# Patient Record
Sex: Male | Born: 1955
Health system: Southern US, Community
[De-identification: ages and names within clinical notes are randomized; demographics above are authoritative.]

## PROBLEM LIST (undated history)

## (undated) DIAGNOSIS — F419 Anxiety disorder, unspecified: Secondary | ICD-10-CM

## (undated) DIAGNOSIS — F32A Depression, unspecified: Secondary | ICD-10-CM

## (undated) DIAGNOSIS — E785 Hyperlipidemia, unspecified: Secondary | ICD-10-CM

## (undated) DIAGNOSIS — K219 Gastro-esophageal reflux disease without esophagitis: Secondary | ICD-10-CM

## (undated) DIAGNOSIS — K760 Fatty (change of) liver, not elsewhere classified: Secondary | ICD-10-CM

## (undated) DIAGNOSIS — E119 Type 2 diabetes mellitus without complications: Secondary | ICD-10-CM

## (undated) DIAGNOSIS — G4730 Sleep apnea, unspecified: Secondary | ICD-10-CM

## (undated) DIAGNOSIS — I10 Essential (primary) hypertension: Secondary | ICD-10-CM

## (undated) DIAGNOSIS — F329 Major depressive disorder, single episode, unspecified: Secondary | ICD-10-CM

## (undated) DIAGNOSIS — R079 Chest pain, unspecified: Secondary | ICD-10-CM

## (undated) HISTORY — DX: Hyperlipidemia, unspecified: E78.5

## (undated) HISTORY — DX: Fatty (change of) liver, not elsewhere classified: K76.0

## (undated) HISTORY — DX: Type 2 diabetes mellitus without complications: E11.9

## (undated) HISTORY — DX: Depression, unspecified: F32.A

## (undated) HISTORY — DX: Gastro-esophageal reflux disease without esophagitis: K21.9

## (undated) HISTORY — DX: Anxiety disorder, unspecified: F41.9

## (undated) HISTORY — DX: Chest pain, unspecified: R07.9

## (undated) HISTORY — DX: Sleep apnea, unspecified: G47.30

## (undated) HISTORY — DX: Essential (primary) hypertension: I10

## (undated) HISTORY — PX: COLONOSCOPY: SHX174

## (undated) HISTORY — DX: Major depressive disorder, single episode, unspecified: F32.9

---

## 2000-09-10 ENCOUNTER — Ambulatory Visit (HOSPITAL_BASED_OUTPATIENT_CLINIC_OR_DEPARTMENT_OTHER): Admission: RE | Admit: 2000-09-10 | Discharge: 2000-09-10 | Payer: Self-pay | Admitting: Family Medicine

## 2001-10-19 ENCOUNTER — Encounter: Payer: Self-pay | Admitting: Family Medicine

## 2001-10-19 ENCOUNTER — Encounter: Admission: RE | Admit: 2001-10-19 | Discharge: 2001-10-19 | Payer: Self-pay | Admitting: Family Medicine

## 2001-10-25 ENCOUNTER — Encounter: Admission: RE | Admit: 2001-10-25 | Discharge: 2001-10-25 | Payer: Self-pay | Admitting: Family Medicine

## 2001-10-25 ENCOUNTER — Encounter: Payer: Self-pay | Admitting: Family Medicine

## 2002-01-04 ENCOUNTER — Emergency Department (HOSPITAL_COMMUNITY): Admission: EM | Admit: 2002-01-04 | Discharge: 2002-01-04 | Payer: Self-pay | Admitting: Emergency Medicine

## 2002-01-04 ENCOUNTER — Encounter: Payer: Self-pay | Admitting: Emergency Medicine

## 2006-07-03 ENCOUNTER — Ambulatory Visit: Payer: Self-pay | Admitting: Internal Medicine

## 2006-07-20 ENCOUNTER — Ambulatory Visit: Payer: Self-pay | Admitting: Internal Medicine

## 2015-05-11 DIAGNOSIS — Z1389 Encounter for screening for other disorder: Secondary | ICD-10-CM | POA: Diagnosis not present

## 2015-05-11 DIAGNOSIS — E119 Type 2 diabetes mellitus without complications: Secondary | ICD-10-CM | POA: Diagnosis not present

## 2015-05-11 DIAGNOSIS — E782 Mixed hyperlipidemia: Secondary | ICD-10-CM | POA: Diagnosis not present

## 2015-05-11 DIAGNOSIS — Z7984 Long term (current) use of oral hypoglycemic drugs: Secondary | ICD-10-CM | POA: Diagnosis not present

## 2015-05-11 DIAGNOSIS — K219 Gastro-esophageal reflux disease without esophagitis: Secondary | ICD-10-CM | POA: Diagnosis not present

## 2015-05-11 DIAGNOSIS — F324 Major depressive disorder, single episode, in partial remission: Secondary | ICD-10-CM | POA: Diagnosis not present

## 2015-05-11 DIAGNOSIS — R748 Abnormal levels of other serum enzymes: Secondary | ICD-10-CM | POA: Diagnosis not present

## 2015-05-11 DIAGNOSIS — I10 Essential (primary) hypertension: Secondary | ICD-10-CM | POA: Diagnosis not present

## 2015-05-11 DIAGNOSIS — Z0000 Encounter for general adult medical examination without abnormal findings: Secondary | ICD-10-CM | POA: Diagnosis not present

## 2015-05-11 MED FILL — ATORVASTATIN 40 MG TABLET: 40 | 90 days supply | Qty: 90 | Fill #0

## 2015-05-11 MED FILL — EZETIMIBE 10 MG TABLET: 10 | 30 days supply | Qty: 30 | Fill #0

## 2015-05-11 MED FILL — VICTOZA 18 MG/3 ML INJECT P: 18 | 90 days supply | Qty: 27 | Fill #0

## 2015-05-13 DIAGNOSIS — S82422D Displaced transverse fracture of shaft of left fibula, subsequent encounter for closed fracture with routine healing: Secondary | ICD-10-CM | POA: Diagnosis not present

## 2015-05-26 DIAGNOSIS — H524 Presbyopia: Secondary | ICD-10-CM | POA: Diagnosis not present

## 2015-06-24 DIAGNOSIS — S82422D Displaced transverse fracture of shaft of left fibula, subsequent encounter for closed fracture with routine healing: Secondary | ICD-10-CM | POA: Diagnosis not present

## 2015-07-06 MED FILL — VALSARTAN 160 MG TABLET: 160 | 90 days supply | Qty: 90 | Fill #0

## 2015-07-06 MED FILL — EZETIMIBE 10 MG TABLET: 10 | 90 days supply | Qty: 90 | Fill #1

## 2015-07-06 MED FILL — DULoxetine HCL 60 MG CPEP: 60 | 90 days supply | Qty: 90 | Fill #0

## 2015-07-06 MED FILL — metFORMIN HCL 1000 MG TABS: 1000 | 90 days supply | Qty: 180 | Fill #0

## 2015-08-05 DIAGNOSIS — S82422D Displaced transverse fracture of shaft of left fibula, subsequent encounter for closed fracture with routine healing: Secondary | ICD-10-CM | POA: Diagnosis not present

## 2015-08-17 MED FILL — VICTOZA 18 MG/3 ML INJECT P: 18 | 90 days supply | Qty: 27 | Fill #1

## 2015-10-05 MED FILL — ATORVASTATIN 40 MG TABLET: 40 | 90 days supply | Qty: 90 | Fill #1

## 2015-10-05 MED FILL — VALSARTAN 160 MG TABLET: 160 | 90 days supply | Qty: 90 | Fill #1

## 2015-10-05 MED FILL — DULoxetine HCL 60 MG CPEP: 60 | 90 days supply | Qty: 90 | Fill #1

## 2015-10-05 MED FILL — EZETIMIBE 10 MG TABLET: 10 | 60 days supply | Qty: 60 | Fill #2

## 2015-10-05 MED FILL — metFORMIN HCL 1000 MG TABS: 1000 | 90 days supply | Qty: 180 | Fill #1

## 2015-11-16 DIAGNOSIS — F324 Major depressive disorder, single episode, in partial remission: Secondary | ICD-10-CM | POA: Diagnosis not present

## 2015-11-16 DIAGNOSIS — K219 Gastro-esophageal reflux disease without esophagitis: Secondary | ICD-10-CM | POA: Diagnosis not present

## 2015-11-16 DIAGNOSIS — E1165 Type 2 diabetes mellitus with hyperglycemia: Secondary | ICD-10-CM | POA: Diagnosis not present

## 2015-11-16 DIAGNOSIS — E782 Mixed hyperlipidemia: Secondary | ICD-10-CM | POA: Diagnosis not present

## 2015-11-16 DIAGNOSIS — I10 Essential (primary) hypertension: Secondary | ICD-10-CM | POA: Diagnosis not present

## 2016-01-01 DIAGNOSIS — M25672 Stiffness of left ankle, not elsewhere classified: Secondary | ICD-10-CM | POA: Diagnosis not present

## 2016-01-01 DIAGNOSIS — S82402S Unspecified fracture of shaft of left fibula, sequela: Secondary | ICD-10-CM | POA: Diagnosis not present

## 2016-01-01 DIAGNOSIS — M6281 Muscle weakness (generalized): Secondary | ICD-10-CM | POA: Diagnosis not present

## 2016-01-08 DIAGNOSIS — M25672 Stiffness of left ankle, not elsewhere classified: Secondary | ICD-10-CM | POA: Diagnosis not present

## 2016-01-08 DIAGNOSIS — S82402S Unspecified fracture of shaft of left fibula, sequela: Secondary | ICD-10-CM | POA: Diagnosis not present

## 2016-01-08 DIAGNOSIS — M6281 Muscle weakness (generalized): Secondary | ICD-10-CM | POA: Diagnosis not present

## 2016-01-13 DIAGNOSIS — M25672 Stiffness of left ankle, not elsewhere classified: Secondary | ICD-10-CM | POA: Diagnosis not present

## 2016-01-13 DIAGNOSIS — M6281 Muscle weakness (generalized): Secondary | ICD-10-CM | POA: Diagnosis not present

## 2016-01-13 DIAGNOSIS — S82402S Unspecified fracture of shaft of left fibula, sequela: Secondary | ICD-10-CM | POA: Diagnosis not present

## 2016-01-13 MED FILL — metFORMIN HCL 1000 MG TABS: 1000 | 90 days supply | Qty: 180 | Fill #0

## 2016-01-13 MED FILL — EZETIMIBE 10 MG TABLET: 10 | 90 days supply | Qty: 90 | Fill #0

## 2016-01-13 MED FILL — PANTOPRAZOLE SOD DR 40 MG T: 40 | 90 days supply | Qty: 90 | Fill #0

## 2016-01-13 MED FILL — VALSARTAN 160 MG TABLET: 160 | 90 days supply | Qty: 90 | Fill #0

## 2016-01-13 MED FILL — DULoxetine HCL 60 MG CPEP: 60 | 90 days supply | Qty: 180 | Fill #0

## 2016-01-13 MED FILL — ATORVASTATIN 40 MG TABLET: 40 | 90 days supply | Qty: 90 | Fill #0

## 2016-01-15 DIAGNOSIS — M7502 Adhesive capsulitis of left shoulder: Secondary | ICD-10-CM | POA: Diagnosis not present

## 2016-01-22 DIAGNOSIS — M6281 Muscle weakness (generalized): Secondary | ICD-10-CM | POA: Diagnosis not present

## 2016-01-22 DIAGNOSIS — M25672 Stiffness of left ankle, not elsewhere classified: Secondary | ICD-10-CM | POA: Diagnosis not present

## 2016-01-22 DIAGNOSIS — S82402S Unspecified fracture of shaft of left fibula, sequela: Secondary | ICD-10-CM | POA: Diagnosis not present

## 2016-02-01 DIAGNOSIS — M6281 Muscle weakness (generalized): Secondary | ICD-10-CM | POA: Diagnosis not present

## 2016-02-01 DIAGNOSIS — M25512 Pain in left shoulder: Secondary | ICD-10-CM | POA: Diagnosis not present

## 2016-02-01 DIAGNOSIS — M25612 Stiffness of left shoulder, not elsewhere classified: Secondary | ICD-10-CM | POA: Diagnosis not present

## 2016-02-04 DIAGNOSIS — M25512 Pain in left shoulder: Secondary | ICD-10-CM | POA: Diagnosis not present

## 2016-02-04 DIAGNOSIS — M25612 Stiffness of left shoulder, not elsewhere classified: Secondary | ICD-10-CM | POA: Diagnosis not present

## 2016-02-04 DIAGNOSIS — M6281 Muscle weakness (generalized): Secondary | ICD-10-CM | POA: Diagnosis not present

## 2016-02-09 DIAGNOSIS — M6281 Muscle weakness (generalized): Secondary | ICD-10-CM | POA: Diagnosis not present

## 2016-02-09 DIAGNOSIS — M25612 Stiffness of left shoulder, not elsewhere classified: Secondary | ICD-10-CM | POA: Diagnosis not present

## 2016-02-09 DIAGNOSIS — M25512 Pain in left shoulder: Secondary | ICD-10-CM | POA: Diagnosis not present

## 2016-02-11 DIAGNOSIS — M25512 Pain in left shoulder: Secondary | ICD-10-CM | POA: Diagnosis not present

## 2016-02-11 DIAGNOSIS — M6281 Muscle weakness (generalized): Secondary | ICD-10-CM | POA: Diagnosis not present

## 2016-02-11 DIAGNOSIS — M25612 Stiffness of left shoulder, not elsewhere classified: Secondary | ICD-10-CM | POA: Diagnosis not present

## 2016-02-12 DIAGNOSIS — M7502 Adhesive capsulitis of left shoulder: Secondary | ICD-10-CM | POA: Diagnosis not present

## 2016-02-16 DIAGNOSIS — M25512 Pain in left shoulder: Secondary | ICD-10-CM | POA: Diagnosis not present

## 2016-02-16 DIAGNOSIS — M6281 Muscle weakness (generalized): Secondary | ICD-10-CM | POA: Diagnosis not present

## 2016-02-16 DIAGNOSIS — M25612 Stiffness of left shoulder, not elsewhere classified: Secondary | ICD-10-CM | POA: Diagnosis not present

## 2016-02-19 DIAGNOSIS — M25612 Stiffness of left shoulder, not elsewhere classified: Secondary | ICD-10-CM | POA: Diagnosis not present

## 2016-02-19 DIAGNOSIS — M25512 Pain in left shoulder: Secondary | ICD-10-CM | POA: Diagnosis not present

## 2016-02-19 DIAGNOSIS — M6281 Muscle weakness (generalized): Secondary | ICD-10-CM | POA: Diagnosis not present

## 2016-02-23 DIAGNOSIS — M25512 Pain in left shoulder: Secondary | ICD-10-CM | POA: Diagnosis not present

## 2016-02-23 DIAGNOSIS — M25612 Stiffness of left shoulder, not elsewhere classified: Secondary | ICD-10-CM | POA: Diagnosis not present

## 2016-02-23 DIAGNOSIS — M6281 Muscle weakness (generalized): Secondary | ICD-10-CM | POA: Diagnosis not present

## 2016-02-25 DIAGNOSIS — M25612 Stiffness of left shoulder, not elsewhere classified: Secondary | ICD-10-CM | POA: Diagnosis not present

## 2016-02-25 DIAGNOSIS — M6281 Muscle weakness (generalized): Secondary | ICD-10-CM | POA: Diagnosis not present

## 2016-02-25 DIAGNOSIS — M25512 Pain in left shoulder: Secondary | ICD-10-CM | POA: Diagnosis not present

## 2016-04-12 MED FILL — VICTOZA 18 MG/3 ML INJECT P: 18 | 90 days supply | Qty: 27 | Fill #0

## 2016-04-12 MED FILL — PANTOPRAZOLE SOD DR 40 MG T: 40 | 90 days supply | Qty: 90 | Fill #1

## 2016-04-12 MED FILL — ATORVASTATIN 40 MG TABLET: 40 | 90 days supply | Qty: 90 | Fill #1

## 2016-04-12 MED FILL — DULoxetine HCL 60 MG CPEP: 60 | 90 days supply | Qty: 180 | Fill #1

## 2016-04-12 MED FILL — metFORMIN HCL 1000 MG TABS: 1000 | 90 days supply | Qty: 180 | Fill #1

## 2016-04-12 MED FILL — VALSARTAN 160 MG TABLET: 160 | 90 days supply | Qty: 90 | Fill #1

## 2016-05-16 ENCOUNTER — Encounter: Payer: Self-pay | Admitting: Gastroenterology

## 2016-07-19 MED FILL — VICTOZA 18 MG/3 ML INJECT P: 18 | 90 days supply | Qty: 27 | Fill #1

## 2016-07-19 MED FILL — DULoxetine HCL 60 MG CPEP: 60 | 90 days supply | Qty: 180 | Fill #0

## 2016-07-19 MED FILL — EZETIMIBE 10 MG TAB: 10 | 90 days supply | Qty: 90 | Fill #1

## 2016-07-19 MED FILL — ATORVASTATIN 40 MG TABLET: 40 | 90 days supply | Qty: 90 | Fill #0

## 2016-07-19 MED FILL — PANTOPRAZOLE SOD DR 40 MG T: 40 | 90 days supply | Qty: 90 | Fill #0

## 2016-07-19 MED FILL — metFORMIN HCL 1000 MG TABS: 1000 | 90 days supply | Qty: 180 | Fill #0

## 2016-07-19 MED FILL — VALSARTAN 160 MG TABLET: 160 | 90 days supply | Qty: 90 | Fill #0

## 2016-08-29 DIAGNOSIS — F411 Generalized anxiety disorder: Secondary | ICD-10-CM | POA: Diagnosis not present

## 2016-09-23 DIAGNOSIS — F411 Generalized anxiety disorder: Secondary | ICD-10-CM | POA: Diagnosis not present

## 2016-10-21 MED FILL — ATORVASTATIN 40 MG TABLET: 40 | 90 days supply | Qty: 90 | Fill #1

## 2016-10-21 MED FILL — DULoxetine HCL 60 MG CPEP: 60 | 90 days supply | Qty: 180 | Fill #1

## 2016-10-21 MED FILL — VALSARTAN 160 MG TABLET: 160 | 90 days supply | Qty: 90 | Fill #1

## 2016-10-21 MED FILL — PANTOPRAZOLE SOD DR 40 MG T: 40 | 90 days supply | Qty: 90 | Fill #1

## 2016-10-21 MED FILL — metFORMIN HCL 1000 MG TABS: 1000 | 90 days supply | Qty: 180 | Fill #1

## 2016-11-14 ENCOUNTER — Telehealth: Payer: Self-pay | Admitting: General Practice

## 2016-11-14 NOTE — Telephone Encounter (Signed)
Called patient and left a voicemail for him to call and establish care with Dr. [name] in August. Patient would like 11/30/16 however, Dr. [name] schedule does not open until the following week. Please schedule patient to establish care/req:physical (morning appt.) the week that Dr. [name] schedule is available.

## 2016-12-05 ENCOUNTER — Encounter: Payer: Self-pay | Admitting: Family Medicine

## 2016-12-05 ENCOUNTER — Ambulatory Visit (INDEPENDENT_AMBULATORY_CARE_PROVIDER_SITE_OTHER): Payer: 59 | Admitting: Family Medicine

## 2016-12-05 ENCOUNTER — Other Ambulatory Visit: Payer: Self-pay | Admitting: Family Medicine

## 2016-12-05 VITALS — BP 130/90 | HR 85 | Ht 69.0 in | Wt 229.2 lb

## 2016-12-05 DIAGNOSIS — I10 Essential (primary) hypertension: Secondary | ICD-10-CM

## 2016-12-05 DIAGNOSIS — Z1211 Encounter for screening for malignant neoplasm of colon: Secondary | ICD-10-CM

## 2016-12-05 DIAGNOSIS — E1169 Type 2 diabetes mellitus with other specified complication: Secondary | ICD-10-CM

## 2016-12-05 DIAGNOSIS — I152 Hypertension secondary to endocrine disorders: Secondary | ICD-10-CM | POA: Insufficient documentation

## 2016-12-05 DIAGNOSIS — E1165 Type 2 diabetes mellitus with hyperglycemia: Secondary | ICD-10-CM | POA: Insufficient documentation

## 2016-12-05 DIAGNOSIS — F325 Major depressive disorder, single episode, in full remission: Secondary | ICD-10-CM

## 2016-12-05 DIAGNOSIS — E1159 Type 2 diabetes mellitus with other circulatory complications: Secondary | ICD-10-CM

## 2016-12-05 DIAGNOSIS — E785 Hyperlipidemia, unspecified: Secondary | ICD-10-CM

## 2016-12-05 DIAGNOSIS — E118 Type 2 diabetes mellitus with unspecified complications: Secondary | ICD-10-CM

## 2016-12-05 DIAGNOSIS — I158 Other secondary hypertension: Secondary | ICD-10-CM | POA: Insufficient documentation

## 2016-12-05 LAB — CBC WITH DIFFERENTIAL/PLATELET
Basophils Absolute: 0 10*3/uL (ref 0.0–0.1)
Basophils Relative: 0.6 % (ref 0.0–3.0)
EOS ABS: 0.1 10*3/uL (ref 0.0–0.7)
Eosinophils Relative: 1.9 % (ref 0.0–5.0)
HEMATOCRIT: 41 % (ref 39.0–52.0)
HEMOGLOBIN: 13.8 g/dL (ref 13.0–17.0)
LYMPHS PCT: 31.8 % (ref 12.0–46.0)
Lymphs Abs: 2.2 10*3/uL (ref 0.7–4.0)
MCHC: 33.8 g/dL (ref 30.0–36.0)
MCV: 93.3 fl (ref 78.0–100.0)
MONO ABS: 0.4 10*3/uL (ref 0.1–1.0)
MONOS PCT: 5.8 % (ref 3.0–12.0)
Neutro Abs: 4.2 10*3/uL (ref 1.4–7.7)
Neutrophils Relative %: 59.9 % (ref 43.0–77.0)
Platelets: 271 10*3/uL (ref 150.0–400.0)
RBC: 4.4 Mil/uL (ref 4.22–5.81)
RDW: 13.1 % (ref 11.5–15.5)
WBC: 7 10*3/uL (ref 4.0–10.5)

## 2016-12-05 LAB — LIPID PANEL
CHOLESTEROL: 143 mg/dL (ref 0–200)
HDL: 51.6 mg/dL (ref >39.00)
LDL CALC: 66 mg/dL (ref 0–99)
NonHDL: 91.46
TRIGLYCERIDES: 125 mg/dL (ref 0.0–149.0)
Total CHOL/HDL Ratio: 3
VLDL: 25 mg/dL (ref 0.0–40.0)

## 2016-12-05 LAB — COMPREHENSIVE METABOLIC PANEL
ALBUMIN: 4.5 g/dL (ref 3.5–5.2)
ALK PHOS: 58 U/L (ref 39–117)
ALT: 48 U/L (ref 0–53)
AST: 43 U/L — ABNORMAL HIGH (ref 0–37)
BUN: 11 mg/dL (ref 6–23)
CALCIUM: 9.2 mg/dL (ref 8.4–10.5)
CO2: 24 mEq/L (ref 19–32)
Chloride: 102 mEq/L (ref 96–112)
Creatinine, Ser: 0.67 mg/dL (ref 0.40–1.50)
GFR: 128 mL/min (ref >60.00)
Glucose, Bld: 242 mg/dL — ABNORMAL HIGH (ref 70–99)
POTASSIUM: 4.2 meq/L (ref 3.5–5.1)
Sodium: 137 mEq/L (ref 135–145)
TOTAL PROTEIN: 6.5 g/dL (ref 6.0–8.3)
Total Bilirubin: 0.5 mg/dL (ref 0.2–1.2)

## 2016-12-05 LAB — POCT GLYCOSYLATED HEMOGLOBIN (HGB A1C): HEMOGLOBIN A1C: 9.4

## 2016-12-05 MED ORDER — EMPAGLIFLOZIN 10 MG PO TABS: 10.0000 mg | ORAL_TABLET | Freq: Every day | ORAL | 3 refills | Status: DC

## 2016-12-05 MED ORDER — LOSARTAN POTASSIUM 100 MG PO TABS: 100.0000 mg | ORAL_TABLET | Freq: Every day | ORAL | 3 refills | Status: DC

## 2016-12-05 MED ORDER — ESCITALOPRAM OXALATE 10 MG PO TABS: 10.0000 mg | ORAL_TABLET | Freq: Every day | ORAL | 3 refills | Status: DC

## 2016-12-05 MED FILL — EZETIMIBE 10 MG TAB: 10 | 30 days supply | Qty: 30 | Fill #0

## 2016-12-05 MED FILL — ESCITALOPRAM 10 MG TABLET: 10 | 90 days supply | Qty: 90 | Fill #0

## 2016-12-05 MED FILL — LOSARTAN POTASSIUM 100 MG T: 100 | 90 days supply | Qty: 90 | Fill #0

## 2016-12-05 MED FILL — JARDIANCE 10 MG TABLET: 10 | 90 days supply | Qty: 90 | Fill #0

## 2016-12-05 NOTE — Assessment & Plan Note (Signed)
Check lipid panel today. Continue atorvastatin and zetia.

## 2016-12-05 NOTE — Assessment & Plan Note (Signed)
PHQ-9 score of 2 today. Will check to cymbalta to lexapro per patient request. Patient will be going to [place] next month. Advised patient to not switch until after his trip in case he has unwanted side effects. Follow up in 3 months.

## 2016-12-05 NOTE — Assessment & Plan Note (Signed)
At goal today. Switch valsartan to losartan due to recall. Check CMET today. Follow up 3 months.

## 2016-12-05 NOTE — Progress Notes (Unsigned)
[name] still elevated. Will start jardiance.  [name]. [name], MD 12/05/2016 1:19 PM

## 2016-12-05 NOTE — Progress Notes (Signed)
Subjective:  Travis Owens is a 61 y.o. male who presents today with a chief complaint of HTN and to establish care.   HPI:  Hypertension, Chronic Problem, New problem to this provider. BP Readings from Last 3 Encounters:  12/05/16 130/90   Home BP monitoring-No Compliant with medications-yes, without side effects  ROS-Denies any chest pain, shortness of breath, dyspnea on exertion, leg edema.   DIABETES Type II, Chronic, New problem to this provider.  Medications: Metformin 1000mg  bid, victoza 1.8 mcg daily, Reports taking and tolerating without side effects. Blood Sugars per patient: Does not check blood sugar.  Diet-Eats a lot of fast food.  Regular Exercise-Has a total gym at home. Is trying to exercise more.  Interim History- Last [name] was 13 last year.   ROS- Denies Polyuria, Polydipsia.   Hyperlipidemia, Chronic, New problem to this provider.  On statin: Yes Side Effects: None  ROS- No chest pain or shortness of breath. No myalgias  Depression, Chronic, New to this provider Current Medications: Cymbalta 60mg  daily Side Effects: None Current Symptoms/Interim History: Currently on cymbalta, but wants to start on lexapro.   Depression screen [facility] 2/9 12/05/2016  Decreased Interest 0  Down, Depressed, Hopeless 0  PHQ - 2 Score 0    ROS: No SI or HI.  Healthcare Maintenance Health Maintenance Due  Topic Date Due  . HEMOGLOBIN [name]  26-Nov-1955  . Hepatitis C Screening  1955-12-05  . PNEUMOCOCCAL POLYSACCHARIDE VACCINE (1) 07/10/1957  . FOOT EXAM  07/10/1965  . OPHTHALMOLOGY EXAM  07/10/1965  . HIV Screening  07/11/1970  . TETANUS/TDAP  07/11/1974  . COLONOSCOPY  07/19/2016  . INFLUENZA VACCINE  11/23/2016   ROS: Per HPI, otherwise all systems reviewed and are negative  PMH:  The following were reviewed and entered/updated in epic: Past Medical History:  Diagnosis Date  . Depression   . Diabetes mellitus without complication ([place])   . GERD  (gastroesophageal reflux disease)   . Hyperlipidemia   . Hypertension    History reviewed. No pertinent surgical history.  Family History  Problem Relation Age of Onset  . Cancer Father        Lung Cancer  . Stroke Father   . Hypertension Paternal Grandmother   . Stroke Paternal Grandfather    Medications- reviewed and updated Current Outpatient Prescriptions  Medication Sig Dispense Refill  . aspirin EC 81 MG tablet Take 81 mg by mouth daily.    Marland Kitchen atorvastatin (LIPITOR) 40 MG tablet TAKE 1 TABLET BY MOUTH ONCE DAILY AS BEDTIME  1  . DULoxetine (CYMBALTA) 60 MG capsule Take one tablet daily.  1  . ezetimibe (ZETIA) 10 MG tablet Take 10 mg by mouth daily.  4  . HYDROcodone-acetaminophen (NORCO/VICODIN) 5-325 MG tablet Takes PRN for Hernia.    Marland Kitchen liraglutide (VICTOZA) 18 MG/3ML SOPN Inject into the skin. Injections 1.8 mcg every morning.    . metFORMIN (GLUCOPHAGE) 1000 MG tablet Take 1,000 mg by mouth 2 (two) times daily.  1  . Multiple Vitamin (MULTIVITAMIN) tablet Take 1 tablet by mouth daily.    . pantoprazole (PROTONIX) 40 MG tablet TAKE 1 TABLET BY MOUTH ONCE DAILY EVERY MORNING  1  . escitalopram (LEXAPRO) 10 MG tablet Take 1 tablet (10 mg total) by mouth daily. 90 tablet 3  . losartan (COZAAR) 100 MG tablet Take 1 tablet (100 mg total) by mouth daily. 90 tablet 3   No current facility-administered medications for this visit.     Allergies-reviewed  and updated No Known Allergies  Social History   Social History  . Marital status: Married    Spouse name: N/A  . Number of children: N/A  . Years of education: N/A   Occupational History  . pastor     [employer]   Social History Main Topics  . Smoking status: Never Smoker  . Smokeless tobacco: Never Used  . Alcohol use No  . Drug use: No  . Sexual activity: Yes   Other Topics Concern  . None   Social History Narrative  . None    Objective:  Physical Exam: BP 130/90   Pulse 85   Ht 5\' 9"   (1.753 m)   Wt 229 lb 3.2 oz (104 kg)   SpO2 98%   BMI 33.85 kg/m   Gen: NAD, resting comfortably CV: RRR with no murmurs appreciated Pulm: NWOB, CTAB with no crackles, wheezes, or rhonchi GI: Obese, normal bowel sounds present. Soft, Nontender, Nondistended. MSK: no edema, cyanosis, or clubbing noted Skin: warm, dry Neuro: grossly normal, moves all extremities Psych: Normal affect and thought content  Assessment/Plan:  Hypertension associated with diabetes ([place]) At goal today. Switch valsartan to losartan due to recall. Check CMET today. Follow up 3 months.   Type 2 diabetes mellitus with complication, without long-term current use of insulin ([name]) Check [name] today. Continue victoza and metformin. If still elevated, would consider starting jardiance. Follow up in 3 months.   Hyperlipidemia associated with type 2 diabetes mellitus ([place]) Check lipid panel today. Continue atorvastatin and zetia.   Depression, major, single episode, complete remission ([name]) PHQ-9 score of 2 today. Will check to cymbalta to lexapro per patient request. Patient will be going to [place] next month. Advised patient to not switch until after his trip in case he has unwanted side effects. Follow up in 3 months.   Healthcare Maintenance Colonoscopy ordered. Will send for old records for HIV and HCV status. Due for DM preventative care - will obtain at next office visit.    [name]. [name], MD 12/05/2016 1:15 PM

## 2016-12-05 NOTE — Assessment & Plan Note (Signed)
Check [name] today. Continue victoza and metformin. If still elevated, would consider starting jardiance. Follow up in 3 months.

## 2016-12-05 NOTE — Patient Instructions (Addendum)
We will change your valsartan to losartan. You can make this change today.   We will check your [name] today. If it is high we will start a new medication called jardiance.   We will check your cholesterol today.  I will send in a prescription for lexapro. You can stop your cymbalta and start your lexapro the next day.  I will put in a referral for your colonoscopy.   Come back in 3 months,  Take care,  Dr [name]

## 2016-12-07 DIAGNOSIS — F411 Generalized anxiety disorder: Secondary | ICD-10-CM | POA: Diagnosis not present

## 2016-12-22 DIAGNOSIS — F411 Generalized anxiety disorder: Secondary | ICD-10-CM | POA: Diagnosis not present

## 2017-01-10 ENCOUNTER — Encounter: Payer: Self-pay | Admitting: Gastroenterology

## 2017-01-25 MED FILL — EZETIMIBE 10 MG TAB: 10 | 30 days supply | Qty: 30 | Fill #1

## 2017-01-26 ENCOUNTER — Other Ambulatory Visit: Payer: Self-pay | Admitting: *Deleted

## 2017-01-26 MED ORDER — LIRAGLUTIDE 18 MG/3ML ~~LOC~~ SOPN: PEN_INJECTOR | SUBCUTANEOUS | 0 refills | Status: DC

## 2017-01-26 MED ORDER — PANTOPRAZOLE SODIUM 40 MG PO TBEC: DELAYED_RELEASE_TABLET | ORAL | 1 refills | Status: DC

## 2017-01-27 MED FILL — PANTOPRAZOLE SOD DR 40 MG T: 40 | 90 days supply | Qty: 90 | Fill #0

## 2017-02-13 DIAGNOSIS — F411 Generalized anxiety disorder: Secondary | ICD-10-CM | POA: Diagnosis not present

## 2017-02-27 ENCOUNTER — Ambulatory Visit (AMBULATORY_SURGERY_CENTER): Payer: Self-pay | Admitting: *Deleted

## 2017-02-27 VITALS — Ht 70.0 in | Wt 228.4 lb

## 2017-02-27 DIAGNOSIS — Z1211 Encounter for screening for malignant neoplasm of colon: Secondary | ICD-10-CM

## 2017-02-27 MED ORDER — NA SULFATE-K SULFATE-MG SULF 17.5-3.13-1.6 GM/177ML PO SOLN: 1.0000 [IU] | Freq: Once | ORAL | 0 refills | Status: AC

## 2017-02-27 MED FILL — SUPREP BOWEL PREP KIT: 17.5-3.13-1 | 1 days supply | Qty: 354 | Fill #0

## 2017-02-27 NOTE — Progress Notes (Signed)
No egg or soy allergy known to patient  No issues with past sedation with any surgeries  or procedures, no intubation problems  No diet pills per patient No home 02 use per patient  No blood thinners per patient  Pt denies issues with constipation  No A fib or A flutter  EMMI video sent to pt's e mail pt declined   

## 2017-03-01 ENCOUNTER — Encounter: Payer: Self-pay | Admitting: Gastroenterology

## 2017-03-02 DIAGNOSIS — F411 Generalized anxiety disorder: Secondary | ICD-10-CM | POA: Diagnosis not present

## 2017-03-07 ENCOUNTER — Ambulatory Visit (INDEPENDENT_AMBULATORY_CARE_PROVIDER_SITE_OTHER): Payer: 59 | Admitting: Family Medicine

## 2017-03-07 ENCOUNTER — Encounter: Payer: Self-pay | Admitting: Family Medicine

## 2017-03-07 VITALS — BP 120/78 | HR 83 | Ht 70.0 in | Wt 227.0 lb

## 2017-03-07 DIAGNOSIS — I152 Hypertension secondary to endocrine disorders: Secondary | ICD-10-CM

## 2017-03-07 DIAGNOSIS — E1169 Type 2 diabetes mellitus with other specified complication: Secondary | ICD-10-CM

## 2017-03-07 DIAGNOSIS — F325 Major depressive disorder, single episode, in full remission: Secondary | ICD-10-CM

## 2017-03-07 DIAGNOSIS — E1159 Type 2 diabetes mellitus with other circulatory complications: Secondary | ICD-10-CM

## 2017-03-07 DIAGNOSIS — Z23 Encounter for immunization: Secondary | ICD-10-CM

## 2017-03-07 DIAGNOSIS — I10 Essential (primary) hypertension: Secondary | ICD-10-CM

## 2017-03-07 DIAGNOSIS — E118 Type 2 diabetes mellitus with unspecified complications: Secondary | ICD-10-CM | POA: Diagnosis not present

## 2017-03-07 DIAGNOSIS — E785 Hyperlipidemia, unspecified: Secondary | ICD-10-CM | POA: Diagnosis not present

## 2017-03-07 LAB — POCT GLYCOSYLATED HEMOGLOBIN (HGB A1C): HEMOGLOBIN A1C: 10.9

## 2017-03-07 MED ORDER — METFORMIN HCL 1000 MG PO TABS: 1000.0000 mg | ORAL_TABLET | Freq: Two times a day (BID) | ORAL | 1 refills | Status: DC

## 2017-03-07 MED ORDER — EZETIMIBE 10 MG PO TABS: 10.0000 mg | ORAL_TABLET | Freq: Every day | ORAL | 3 refills | Status: DC

## 2017-03-07 MED ORDER — ATORVASTATIN CALCIUM 40 MG PO TABS: ORAL_TABLET | ORAL | 1 refills | Status: DC

## 2017-03-07 MED ORDER — LIRAGLUTIDE 18 MG/3ML ~~LOC~~ SOPN: PEN_INJECTOR | SUBCUTANEOUS | 0 refills | Status: DC

## 2017-03-07 MED FILL — ATORVASTATIN 40 MG TABLET: 40 | 90 days supply | Qty: 90 | Fill #0

## 2017-03-07 MED FILL — LOSARTAN POTASSIUM 100 MG T: 100 | 90 days supply | Qty: 90 | Fill #1

## 2017-03-07 MED FILL — EZETIMIBE 10 MG TABS: 10 | 30 days supply | Qty: 30 | Fill #2

## 2017-03-07 MED FILL — ESCITALOPRAM 10 MG TABLET: 10 | 90 days supply | Qty: 90 | Fill #1

## 2017-03-07 MED FILL — VICTOZA 18 MG/3 ML INJECT P: 18 | 30 days supply | Qty: 9 | Fill #0

## 2017-03-07 MED FILL — metFORMIN HCL 1000 MG TABS: 1000 | 45 days supply | Qty: 90 | Fill #0

## 2017-03-07 NOTE — Progress Notes (Signed)
Subjective:  Travis Owens is a 61 y.o. male who presents today with a chief complaint of HTN.   HPI:  Hypertension, Chronic Problem, Stable BP Readings from Last 3 Encounters:  03/07/17 120/78  12/05/16 130/90   Current Medications: losartan 100mg  daily, compliant without side effects. Interim History: Patient switched to losartan 3 months ago due to valsartan recall.  He has done well on this medication switch and has not noticed any side effects.  ROS: Denies any chest pain, shortness of breath, dyspnea on exertion, leg edema.   DIABETES Type II, Chronic, Uncontrolled. Medications: Jardiance 10 mg daily, Victoza 1.8 mcg daily, metformin 1000 mg twice daily Blood Sugars per patient: Has not been checking Interim History- Patient recently went to [place] and has only been taking metformin over that time.  He has not been checking his blood sugars over the past several weeks.Marland Kitchen  His refrigerator when out for about 5 weeks and is not been able to have any Victoza.  Now that he is back in the [place] he wishes to restart his Victoza and also wishes to start [place].  ROS: Denies Polyuria,Polydipsia, Denies  Hypoglycemia symptoms (palpitations, tremors, anxiousness)   Depression, Chronic, Stable Current Medications: Lexapro 10 mg daily Side Effects: None Current Symptoms/Interim History: Patient seen about 3 months ago and switched from Cymbalta to Lexapro.  He has done very well on this and is not noticed any side effects.  Mood is good.  Depression screen [facility] 2/9 12/05/2016  Decreased Interest 0  Down, Depressed, Hopeless 0  PHQ - 2 Score 0  Altered sleeping 0  Tired, decreased energy 1  Change in appetite 0  Feeling bad or failure about yourself  1  Trouble concentrating 0  Moving slowly or fidgety/restless 0  Suicidal thoughts 0  PHQ-9 Score 2  Difficult doing work/chores Somewhat difficult    ROS: No SI or HI.  Hyperlipidemia, Chronic, Stable Current  medication(s): Lipitor 40 mg daily, Zetia 10 mg daily Side Effects: None  ROS: No chest pain or shortness of breath. No myalgias.  ROS: Per HPI  PMH: Smoking history reviewed. Never smoker.  Objective:  Physical Exam: BP 120/78   Pulse 83   Ht 5\' 10"  (1.778 m)   Wt 227 lb (103 kg)   SpO2 98%   BMI 32.57 kg/m   Gen: NAD, resting comfortably CV: RRR with no murmurs appreciated Pulm: NWOB, CTAB with no crackles, wheezes, or rhonchi GI: Normal bowel sounds present. Soft, Nontender, Nondistended. MSK: No edema, cyanosis, or clubbing noted.  Foot exam completed without abnormalities.  Normal sensation.  See flow sheet for details. Skin: Warm, dry Neuro: Grossly normal, moves all extremities Psych: Normal affect and thought content  Assessment/Plan:  Type 2 diabetes mellitus with complication, without long-term current use of insulin ([name]) [name] increased to 10.9 today.  This is likely due to him being [place] of the [place] and not being on all of his medications.  We will plan to restart his Victoza today.  Also start Jardiance 10 mg daily. Discussed lifestyle modifications as well.  Follow-up 3 months for repeat [name].  Foot exam performed today.  Advised patient that he is due for a eye exam.  Pneumonia shot given today as well.  Hypertension associated with diabetes ([place]) At goal.  Continue losartan 100 mg daily.  Hyperlipidemia associated with type 2 diabetes mellitus ([name]) Last LDL 66.  Continue Lipitor and Zetia.  Depression, major, single episode, complete remission ([place])  Stable.  Continue Lexapro.  Preventative Healthcare Flu shot and pneumonia shot given today. Foot exam today. Advised patient to have eye exam soon. Will be getting colonoscopy next week. Awaiting records for HIV and hep C status.   [name]. [name], MD 03/07/2017 11:27 AM

## 2017-03-07 NOTE — Assessment & Plan Note (Signed)
At goal.  Continue losartan 100 mg daily. ?

## 2017-03-07 NOTE — Assessment & Plan Note (Addendum)
[name] increased to 10.9 today.  This is likely due to him being out of the country and not being on all of his medications.  We will plan to restart his Victoza today.  Also start Jardiance 10 mg daily. Discussed lifestyle modifications as well.  Follow-up 3 months for repeat [name].  Foot exam performed today.  Advised patient that he is due for a eye exam.  Pneumonia shot given today as well.

## 2017-03-07 NOTE — Assessment & Plan Note (Signed)
Stable.  Continue Lexapro. 

## 2017-03-07 NOTE — Patient Instructions (Signed)
Restart the victoza.  Take 0.57mcg daily for 1 week. Then increase to 1.34mcg daily for 1 week. Then increase to 1.8mcg daily.  Restart the jardiance.  Please schedule an eye appointment.  Come back to see me in 3 months, or sooner as needed.  Take care,  Dr [name]

## 2017-03-07 NOTE — Assessment & Plan Note (Addendum)
Last LDL 66.  Continue Lipitor and Zetia.

## 2017-03-13 ENCOUNTER — Ambulatory Visit (AMBULATORY_SURGERY_CENTER): Payer: 59 | Admitting: Gastroenterology

## 2017-03-13 ENCOUNTER — Other Ambulatory Visit: Payer: Self-pay

## 2017-03-13 ENCOUNTER — Encounter: Payer: Self-pay | Admitting: *Deleted

## 2017-03-13 VITALS — BP 110/56 | HR 80 | Temp 96.2°F | Resp 13 | Ht 70.0 in | Wt 228.0 lb

## 2017-03-13 DIAGNOSIS — D122 Benign neoplasm of ascending colon: Secondary | ICD-10-CM

## 2017-03-13 DIAGNOSIS — Z1211 Encounter for screening for malignant neoplasm of colon: Secondary | ICD-10-CM

## 2017-03-13 MED ORDER — SODIUM CHLORIDE 0.9 % IV SOLN: 500.0000 mL | INTRAVENOUS | Status: DC

## 2017-03-13 NOTE — Progress Notes (Signed)
Called to room to assist during endoscopic procedure.  Patient ID and intended procedure confirmed with present staff. Received instructions for my participation in the procedure from the performing physician.  

## 2017-03-13 NOTE — Op Note (Signed)
[place] Patient Name: Travis Owens Procedure Date: 03/13/2017 10:10 AM MRN: [id number] Endoscopist: Mallie Mussel L. Loletha Carrow , MD Age: 61 Referring MD:  Date of Birth: [birth year] Gender: Male Account #: [id number] Procedure:                Colonoscopy Indications:              Screening for colorectal malignant neoplasm ( no                            polyps on 06/2006 colonoscopy) Medicines:                Monitored Anesthesia Care Procedure:                Pre-Anesthesia Assessment:                           - Prior to the procedure, a History and Physical                            was performed, and patient medications and                            allergies were reviewed. The patient's tolerance of                            previous anesthesia was also reviewed. The risks                            and benefits of the procedure and the sedation                            options and risks were discussed with the patient.                            All questions were answered, and informed consent                            was obtained. Anticoagulants: The patient has taken                            aspirin. It was decided not to withhold this                            medication prior to the procedure. ASA Grade                            Assessment: II - A patient with mild systemic                            disease. After reviewing the risks and benefits,                            the patient was deemed in satisfactory condition to  undergo the procedure.                           After obtaining informed consent, the colonoscope                            was passed under direct vision. Throughout the                            procedure, the patient's blood pressure, pulse, and                            oxygen saturations were monitored continuously. The                            Model CF-HQ190L [id number]) scope was introduced                    through the anus and advanced to the the cecum,                            identified by appendiceal orifice and ileocecal                            valve. The colonoscopy was performed without                            difficulty. The patient tolerated the procedure                            well. The quality of the bowel preparation was                            excellent. The ileocecal valve, appendiceal                            orifice, and rectum were photographed. The bowel                            preparation used was SUPREP. Scope In: 10:16:40 AM Scope Out: 10:32:09 AM Scope Withdrawal Time: 0 hours 9 minutes 57 seconds  Total Procedure Duration: 0 hours 15 minutes 29 seconds  Findings:                 The perianal and digital rectal examinations were                            normal.                           Two sessile polyps were found in the ascending                            colon. The polyps were 2 to 4 mm in size. These  polyps were removed with a cold biopsy forceps.                            Resection and retrieval were complete.                           Diverticula were found in the left colon.                           The exam was otherwise without abnormality on                            direct and retroflexion views. Complications:            No immediate complications. Estimated Blood Loss:     Estimated blood loss was minimal. Impression:               - Two 2 to 4 mm polyps in the ascending colon,                            removed with a cold biopsy forceps. Resected and                            retrieved.                           - Diverticulosis in the left colon.                           - The examination was otherwise normal on direct                            and retroflexion views. Recommendation:           - Patient has a contact number available for                            emergencies.  The signs and symptoms of potential                            delayed complications were discussed with the                            patient. Return to normal activities tomorrow.                            Written discharge instructions were provided to the                            patient.                           - Resume previous diet.                           - Continue present medications.                           -  Await pathology results.                           - Repeat colonoscopy is recommended for                            surveillance. The colonoscopy date will be                            determined after pathology results from today's                            exam become available for review. Nihal Marzella L. Loletha Carrow, MD 03/13/2017 10:40:53 AM This report has been signed electronically.

## 2017-03-13 NOTE — Progress Notes (Signed)
To PACU, VSS. Report to [email or web address] 

## 2017-03-13 NOTE — Progress Notes (Signed)
Pt's states no medical or surgical changes since previsit or office visit. 

## 2017-03-13 NOTE — Patient Instructions (Signed)
Handout given on polyps  YOU HAD AN ENDOSCOPIC PROCEDURE TODAY AT THE [facility]:   Refer to the procedure report that was given to you for any specific questions about what was found during the examination.  If the procedure report does not answer your questions, please call your gastroenterologist to clarify.  If you requested that your care partner not be given the details of your procedure findings, then the procedure report has been included in a sealed envelope for you to review at your convenience later.  YOU SHOULD EXPECT: Some feelings of bloating in the abdomen. Passage of more gas than usual.  Walking can help get rid of the air that was put into your GI tract during the procedure and reduce the bloating. If you had a lower endoscopy (such as a colonoscopy or flexible sigmoidoscopy) you may notice spotting of blood in your stool or on the toilet paper. If you underwent a bowel prep for your procedure, you may not have a normal bowel movement for a few days.  Please Note:  You might notice some irritation and congestion in your nose or some drainage.  This is from the oxygen used during your procedure.  There is no need for concern and it should clear up in a day or so.  SYMPTOMS TO REPORT IMMEDIATELY:   Following lower endoscopy (colonoscopy or flexible sigmoidoscopy):  Excessive amounts of blood in the stool  Significant tenderness or worsening of abdominal pains  Swelling of the abdomen that is new, acute  Fever of 100F or higher    For urgent or emergent issues, a gastroenterologist can be reached at any hour by calling [phone].   DIET:  We do recommend a small meal at first, but then you may proceed to your regular diet.  Drink plenty of fluids but you should avoid alcoholic beverages for 24 hours.  ACTIVITY:  You should plan to take it easy for the rest of today and you should NOT DRIVE or use heavy machinery until tomorrow (because of the sedation  medicines used during the test).    FOLLOW UP: Our staff will call the number listed on your records the next business day following your procedure to check on you and address any questions or concerns that you may have regarding the information given to you following your procedure. If we do not reach you, we will leave a message.  However, if you are feeling well and you are not experiencing any problems, there is no need to return our call.  We will assume that you have returned to your regular daily activities without incident.  If any biopsies were taken you will be contacted by phone or by letter within the next 1-3 weeks.  Please call us at [phone] if you have not heard about the biopsies in 3 weeks.    SIGNATURES/CONFIDENTIALITY: You and/or your care partner have signed paperwork which will be entered into your electronic medical record.  These signatures attest to the fact that that the information above on your After Visit Summary has been reviewed and is understood.  Full responsibility of the confidentiality of this discharge information lies with you and/or your care-partner. 

## 2017-03-14 ENCOUNTER — Telehealth: Payer: Self-pay

## 2017-03-14 NOTE — Telephone Encounter (Signed)
  Follow up Call-  Call back number 03/13/2017  Post procedure Call Back phone  # [phone]  Permission to leave phone message Yes  Some recent data might be hidden     Patient questions:  Do you have a fever, pain , or abdominal swelling? No. Pain Score  0 *  Have you tolerated food without any problems? Yes.    Have you been able to return to your normal activities? Yes.    Do you have any questions about your discharge instructions: Diet   No. Medications  No. Follow up visit  No.  Do you have questions or concerns about your Care? No.  Actions: * If pain score is 4 or above: No action needed, pain <4.

## 2017-03-20 ENCOUNTER — Encounter: Payer: Self-pay | Admitting: Gastroenterology

## 2017-04-10 MED FILL — VICTOZA 18 MG/3 ML INJECT P: 18 | 30 days supply | Qty: 9 | Fill #1

## 2017-04-10 MED FILL — JARDIANCE 10 MG TABLET: 10 | 90 days supply | Qty: 90 | Fill #1

## 2017-04-10 MED FILL — EZETIMIBE 10 MG TABS: 10 | 30 days supply | Qty: 30 | Fill #3

## 2017-04-26 MED FILL — PANTOPRAZOLE SOD DR 40 MG T: 40 | 90 days supply | Qty: 90 | Fill #1

## 2017-04-26 MED FILL — metFORMIN HCL 1000 MG TABS: 1000 | 45 days supply | Qty: 90 | Fill #1

## 2017-05-12 DIAGNOSIS — H52203 Unspecified astigmatism, bilateral: Secondary | ICD-10-CM | POA: Diagnosis not present

## 2017-05-12 LAB — HM DIABETES EYE EXAM

## 2017-05-12 MED FILL — EZETIMIBE 10 MG TABS: 10 | 30 days supply | Qty: 30 | Fill #4

## 2017-05-30 ENCOUNTER — Encounter: Payer: Self-pay | Admitting: Family Medicine

## 2017-06-07 ENCOUNTER — Ambulatory Visit (INDEPENDENT_AMBULATORY_CARE_PROVIDER_SITE_OTHER): Payer: 59 | Admitting: Family Medicine

## 2017-06-07 ENCOUNTER — Encounter: Payer: Self-pay | Admitting: Family Medicine

## 2017-06-07 VITALS — BP 122/78 | HR 77 | Temp 98.5°F | Ht 70.0 in | Wt 228.6 lb

## 2017-06-07 DIAGNOSIS — F325 Major depressive disorder, single episode, in full remission: Secondary | ICD-10-CM

## 2017-06-07 DIAGNOSIS — E1159 Type 2 diabetes mellitus with other circulatory complications: Secondary | ICD-10-CM

## 2017-06-07 DIAGNOSIS — I152 Hypertension secondary to endocrine disorders: Secondary | ICD-10-CM

## 2017-06-07 DIAGNOSIS — I10 Essential (primary) hypertension: Secondary | ICD-10-CM | POA: Diagnosis not present

## 2017-06-07 DIAGNOSIS — E118 Type 2 diabetes mellitus with unspecified complications: Secondary | ICD-10-CM

## 2017-06-07 LAB — POCT GLYCOSYLATED HEMOGLOBIN (HGB A1C): HEMOGLOBIN A1C: 10.5

## 2017-06-07 MED ORDER — METFORMIN HCL 1000 MG PO TABS: 1000.0000 mg | ORAL_TABLET | Freq: Two times a day (BID) | ORAL | 1 refills | Status: DC

## 2017-06-07 MED ORDER — EZETIMIBE 10 MG PO TABS: 10.0000 mg | ORAL_TABLET | Freq: Every day | ORAL | 3 refills | Status: DC

## 2017-06-07 MED ORDER — PANTOPRAZOLE SODIUM 40 MG PO TBEC: DELAYED_RELEASE_TABLET | ORAL | 3 refills | Status: DC

## 2017-06-07 MED FILL — metFORMIN HCL 1000 MG TABS: 1000 | 90 days supply | Qty: 180 | Fill #0

## 2017-06-07 MED FILL — EZETIMIBE 10 MG TABS: 10 | 90 days supply | Qty: 90 | Fill #0

## 2017-06-07 MED FILL — LOSARTAN POTASSIUM 100 MG T: 100 | 90 days supply | Qty: 90 | Fill #2

## 2017-06-07 MED FILL — ATORVASTATIN 40 MG TABLET: 40 | 90 days supply | Qty: 90 | Fill #1

## 2017-06-07 MED FILL — ESCITALOPRAM 10 MG TABLET: 10 | 90 days supply | Qty: 90 | Fill #2

## 2017-06-07 NOTE — Assessment & Plan Note (Signed)
Stable.  Continue current dose of Lexapro.

## 2017-06-07 NOTE — Patient Instructions (Signed)
Please restart the Jardiance.  Please let me know if you are not able to tolerate this medication.  We will not make any other medication changes today.  I would like to see you back in about 3 months, or sooner as needed.  Take care, Dr. [name]

## 2017-06-07 NOTE — Assessment & Plan Note (Signed)
At goal.  Continue losartan. 

## 2017-06-07 NOTE — Assessment & Plan Note (Signed)
[name] still uncontrolled.  His value today was 10.5.  Discussed importance of good glycemic control regarding micro vascular complications.  We will restart Jardiance at 10 mg daily.  Advised patient to let me know if he is unable to tolerate this.  Continue current dose of Victoza and metformin.  If unable to tolerate Jardiance, would consider starting sulfonylurea versus other SGL T-2 inhibitor.  Encourage patient to continue lifestyle modifications.  Discussed goal weight and weight loss.  Follow-up in 3 months.  If continues to be uncontrolled despite 3 oral anti-glycemics as well as GLP-1 agonist, will likely need to start basal insulin.

## 2017-06-07 NOTE — Progress Notes (Signed)
    Subjective:  Travis Owens is a 62 y.o. male who presents today with a chief complaint of T2DM follow up.   HPI:  T2DM, Established problem, uncontrolled Patient seen about 3 months ago.  At that visit, we restarted his Victoza.  We also started Jardiance 10 mg daily.  Patient has been compliant with his Victoza however reports that the Jardiance made him feel "off" in his genital area.  He was only on the Jardiance for about a month before stopping it.  Denies any dysuria.  Denies any frequent urination.  He has been compliant with his metformin 1000 mg twice daily.  Denies any recent dietary changes.  He is planning on exercising more once the weather improves outside.  Hypertension, established problem, stable Tolerating losartan well without side effects.  Compliant with his medication.  No chest pain or shortness of breath.  Depression, stable from, stable On Lexapro 10 mg daily.  No feelings of depressed mood.  No anhedonia.  Tolerating this dose well without side effects.  ROS: Per HPI  PMH: He reports that  has never smoked. he has never used smokeless tobacco. He reports that he does not drink alcohol or use drugs.  Objective:  Physical Exam: BP 122/78 (BP Location: Left Arm, Patient Position: Sitting, Cuff Size: Normal)   Pulse 77   Temp 98.5 F (36.9 C) (Oral)   Ht 5\' 10"  (1.778 m)   Wt 228 lb 9.6 oz (103.7 kg)   SpO2 95%   BMI 32.80 kg/m   Gen: NAD, resting comfortably CV: RRR with no murmurs appreciated Pulm: NWOB, CTAB with no crackles, wheezes, or rhonchi GI: Normal bowel sounds present. Soft, Nontender, Nondistended.  Results for orders placed or performed in visit on 06/07/17 (from the past 24 hour(s))  POCT glycosylated hemoglobin (Hb [name])     Status: None   Collection Time: 06/07/17 10:32 AM  Result Value Ref Range   Hemoglobin [name] 10.5     Assessment/Plan:  Type 2 diabetes mellitus with complication, without long-term current use of insulin  ([name]) [name] still uncontrolled.  His value today was 10.5.  Discussed importance of good glycemic control regarding micro vascular complications.  We will restart Jardiance at 10 mg daily.  Advised patient to let me know if he is unable to tolerate this.  Continue current dose of Victoza and metformin.  If unable to tolerate Jardiance, would consider starting sulfonylurea versus other SGL T-2 inhibitor.  Encourage patient to continue lifestyle modifications.  Discussed goal weight and weight loss.  Follow-up in 3 months.  If continues to be uncontrolled despite 3 oral anti-glycemics as well as GLP-1 agonist, will likely need to start basal insulin.  Hypertension associated with diabetes ([place]) At goal.  Continue losartan.  Depression, major, single episode, complete remission ([name]) Stable.  Continue current dose of Lexapro.  [name]. [name], MD 06/07/2017 11:55 AM

## 2017-08-18 MED FILL — PANTOPRAZOLE SOD DR 40 MG T: 40 | 90 days supply | Qty: 90 | Fill #0

## 2017-08-18 MED FILL — VICTOZA 18 MG/3 ML INJECT P: 18 | 30 days supply | Qty: 9 | Fill #2

## 2017-09-04 ENCOUNTER — Ambulatory Visit (INDEPENDENT_AMBULATORY_CARE_PROVIDER_SITE_OTHER): Payer: 59 | Admitting: Family Medicine

## 2017-09-04 ENCOUNTER — Other Ambulatory Visit: Payer: Self-pay | Admitting: Family Medicine

## 2017-09-04 ENCOUNTER — Encounter: Payer: Self-pay | Admitting: Family Medicine

## 2017-09-04 VITALS — BP 124/82 | HR 67 | Temp 98.8°F | Resp 12 | Ht 70.0 in | Wt 227.0 lb

## 2017-09-04 DIAGNOSIS — E1169 Type 2 diabetes mellitus with other specified complication: Secondary | ICD-10-CM | POA: Diagnosis not present

## 2017-09-04 DIAGNOSIS — E118 Type 2 diabetes mellitus with unspecified complications: Secondary | ICD-10-CM

## 2017-09-04 DIAGNOSIS — E1159 Type 2 diabetes mellitus with other circulatory complications: Secondary | ICD-10-CM

## 2017-09-04 DIAGNOSIS — E785 Hyperlipidemia, unspecified: Secondary | ICD-10-CM

## 2017-09-04 DIAGNOSIS — I10 Essential (primary) hypertension: Secondary | ICD-10-CM | POA: Diagnosis not present

## 2017-09-04 DIAGNOSIS — I152 Hypertension secondary to endocrine disorders: Secondary | ICD-10-CM

## 2017-09-04 MED FILL — LOSARTAN POTASSIUM 100 MG T: 100 | 90 days supply | Qty: 90 | Fill #3

## 2017-09-04 MED FILL — EZETIMIBE 10 MG TABLET: 10 | 90 days supply | Qty: 90 | Fill #1

## 2017-09-04 MED FILL — ATORVASTATIN 40 MG TABLET: 40 | 90 days supply | Qty: 90 | Fill #0

## 2017-09-04 MED FILL — metFORMIN HCL 1000 MG TABS: 1000 | 90 days supply | Qty: 180 | Fill #1

## 2017-09-04 MED FILL — ESCITALOPRAM 10 MG TABLET: 10 | 90 days supply | Qty: 90 | Fill #3

## 2017-09-04 NOTE — Progress Notes (Signed)
   Subjective:  Travis Owens is a 62 y.o. male who presents today with a chief complaint of T2DM.   HPI:  T2DM, chronic problem, stable Patient seen about a 3 months ago for this.  That time [name] was 10.5.  We restarted his Jardiance at 10 mg daily.  We also continued him on the Actos and metformin.  He has done well with his medication since.  He has been compliant with Jardiance 10 mg daily, metformin 1000 mg twice daily, and Victoza 1.2 mg daily.  Patient had to dial back his Actos a dose due to some nausea and vomiting, but is otherwise tolerating his current dose well.  He has been more active lately.  Working on weight loss.  Has been more active in his yard as well.  Hypertension, established problem, stable Currently on losartan 100 mg daily.  Tolerates this well without side effects.  Hyperlipidemia, established problem, stable On Lipitor 40 mg daily.  Tolerates this well without side effects.  Needs refill today.  ROS: Per HPI  PMH: He reports that he has never smoked. He has never used smokeless tobacco. He reports that he does not drink alcohol or use drugs.   Objective:  Physical Exam: BP 124/82   Pulse 67   Temp 98.8 F (37.1 C) (Oral)   Resp 12   Ht 5\' 10"  (1.778 m)   Wt 227 lb (103 kg)   SpO2 97%   BMI 32.57 kg/m   Gen: NAD, resting comfortably CV: RRR with no murmurs appreciated Pulm: NWOB, CTAB with no crackles, wheezes, or rhonchi  Assessment/Plan:  Type 2 diabetes mellitus with complication, without long-term current use of insulin ([name]) Patient is not within 90 days to recheck his [name] today.  We will continue his current medications of Jardiance 10 mg daily, metformin 1000 mg twice daily, and Victoza 1.2 mg daily.  He will come back in 3 days to recheck [name].  If still not at goal, would consider increasing dose of Jardiance, or increasing dose of Victoza as tolerated.  Hypertension associated with diabetes ([place]) At goal on losartan 100mg  daily.    Hyperlipidemia associated with type 2 diabetes mellitus ([name]) Stable on Lipitor and Zetia.  Refill for Lipitor was sent in today.   [name]. [name], MD 09/04/2017 10:59 AM

## 2017-09-04 NOTE — Assessment & Plan Note (Signed)
Stable on Lipitor and Zetia.  Refill for Lipitor was sent in today.

## 2017-09-04 NOTE — Assessment & Plan Note (Signed)
At goal on losartan 100mg daily.  

## 2017-09-04 NOTE — Assessment & Plan Note (Signed)
Patient is not within 90 days to recheck his [name] today.  We will continue his current medications of Jardiance 10 mg daily, metformin 1000 mg twice daily, and Victoza 1.2 mg daily.  He will come back in 3 days to recheck [name].  If still not at goal, would consider increasing dose of Jardiance, or increasing dose of Victoza as tolerated.

## 2017-09-04 NOTE — Patient Instructions (Signed)
It was very nice to see you today!  I am glad that you are able to lose a little weight.  Please keep up the good work.  Please come back later this week to have your [name] checked.  You can schedule this as a nurse visit.  We will not make any other medication changes today.    I would like to see back in 3 to 6 months depending on the result of your [name].  Take care, Dr [name]

## 2017-09-07 ENCOUNTER — Ambulatory Visit (INDEPENDENT_AMBULATORY_CARE_PROVIDER_SITE_OTHER): Payer: 59

## 2017-09-07 DIAGNOSIS — E118 Type 2 diabetes mellitus with unspecified complications: Secondary | ICD-10-CM

## 2017-09-07 LAB — POCT GLYCOSYLATED HEMOGLOBIN (HGB A1C): HEMOGLOBIN A1C: 7.8

## 2017-09-07 NOTE — Progress Notes (Signed)
Patient in office for nurse visit to have [name] checked. Patient did not have any questions at this time a1c was 7.8. Informed we will call if any changes need to be made after provider has reviewed. Patient was very happy with the improvement from last check.

## 2017-11-06 MED FILL — JARDIANCE 10 MG TABLET: 10 | 90 days supply | Qty: 90 | Fill #2

## 2017-12-04 ENCOUNTER — Other Ambulatory Visit: Payer: Self-pay | Admitting: Family Medicine

## 2017-12-04 MED FILL — ATORVASTATIN 40 MG TABLET: 40 | 90 days supply | Qty: 90 | Fill #1

## 2017-12-04 MED FILL — VICTOZA 18 MG/3 ML INJECT P: 18 | 30 days supply | Qty: 9 | Fill #3

## 2017-12-04 MED FILL — EZETIMIBE 10 MG TABLET: 10 | 90 days supply | Qty: 90 | Fill #2

## 2017-12-04 MED FILL — metFORMIN HCL 1000 MG TABS: 1000 | 90 days supply | Qty: 180 | Fill #0

## 2017-12-04 MED FILL — ESCITALOPRAM 10 MG TABLET: 10 | 90 days supply | Qty: 90 | Fill #0

## 2017-12-04 MED FILL — LOSARTAN POTASSIUM 100 MG T: 100 | 90 days supply | Qty: 90 | Fill #0

## 2017-12-04 MED FILL — PANTOPRAZOLE SOD DR 40 MG T: 40 | 90 days supply | Qty: 90 | Fill #1

## 2018-01-08 DIAGNOSIS — F411 Generalized anxiety disorder: Secondary | ICD-10-CM | POA: Diagnosis not present

## 2018-02-06 ENCOUNTER — Other Ambulatory Visit: Payer: Self-pay | Admitting: Family Medicine

## 2018-02-07 MED FILL — JARDIANCE 10 MG TABLET: 10 | 90 days supply | Qty: 90 | Fill #0

## 2018-03-13 ENCOUNTER — Other Ambulatory Visit: Payer: Self-pay | Admitting: Family Medicine

## 2018-03-13 MED FILL — PANTOPRAZOLE SOD DR 40 MG T: 40 | 90 days supply | Qty: 90 | Fill #2

## 2018-03-13 MED FILL — metFORMIN HCL 1000 MG TABS: 1000 | 90 days supply | Qty: 180 | Fill #1

## 2018-03-13 MED FILL — EZETIMIBE 10 MG TABS: 10 | 90 days supply | Qty: 90 | Fill #3

## 2018-03-13 MED FILL — LOSARTAN POTASSIUM 100 MG T: 100 | 90 days supply | Qty: 90 | Fill #1

## 2018-03-13 MED FILL — ESCITALOPRAM 10 MG TABLET: 10 | 90 days supply | Qty: 90 | Fill #1

## 2018-03-13 MED FILL — ATORVASTATIN 40 MG TABLET: 40 | 90 days supply | Qty: 90 | Fill #0

## 2018-04-02 DIAGNOSIS — F411 Generalized anxiety disorder: Secondary | ICD-10-CM | POA: Diagnosis not present

## 2018-05-15 MED FILL — JARDIANCE 10 MG TABLET: 10 | 90 days supply | Qty: 90 | Fill #1

## 2018-06-11 ENCOUNTER — Other Ambulatory Visit: Payer: Self-pay | Admitting: Family Medicine

## 2018-06-11 MED FILL — PANTOPRAZOLE SOD DR 40 MG T: 40 | 90 days supply | Qty: 90 | Fill #0

## 2018-06-11 MED FILL — ESCITALOPRAM 10 MG TABLET: 10 | 90 days supply | Qty: 90 | Fill #2

## 2018-06-11 MED FILL — metFORMIN HCL 1000 MG TABS: 1000 | 90 days supply | Qty: 180 | Fill #0

## 2018-06-11 MED FILL — LOSARTAN POTASSIUM 100 MG T: 100 | 90 days supply | Qty: 90 | Fill #2

## 2018-06-11 MED FILL — EZETIMIBE 10 MG TABS: 10 | 90 days supply | Qty: 90 | Fill #0

## 2018-06-11 MED FILL — ATORVASTATIN 40 MG TABLET: 40 | 90 days supply | Qty: 90 | Fill #1

## 2018-07-05 ENCOUNTER — Other Ambulatory Visit: Payer: Self-pay | Admitting: Family Medicine

## 2018-07-11 ENCOUNTER — Other Ambulatory Visit: Payer: Self-pay | Admitting: Family Medicine

## 2018-08-22 MED FILL — JARDIANCE 10 MG TABLET: 10 | 90 days supply | Qty: 90 | Fill #0

## 2018-09-10 ENCOUNTER — Encounter: Payer: 59 | Admitting: Family Medicine

## 2018-09-12 ENCOUNTER — Other Ambulatory Visit: Payer: Self-pay

## 2018-09-12 ENCOUNTER — Telehealth: Payer: Self-pay | Admitting: Family Medicine

## 2018-09-12 ENCOUNTER — Ambulatory Visit (INDEPENDENT_AMBULATORY_CARE_PROVIDER_SITE_OTHER): Payer: No Typology Code available for payment source | Admitting: Family Medicine

## 2018-09-12 ENCOUNTER — Encounter: Payer: Self-pay | Admitting: Family Medicine

## 2018-09-12 VITALS — BP 136/84 | HR 85 | Temp 98.4°F | Ht 70.0 in | Wt 227.0 lb

## 2018-09-12 DIAGNOSIS — E1169 Type 2 diabetes mellitus with other specified complication: Secondary | ICD-10-CM

## 2018-09-12 DIAGNOSIS — Z125 Encounter for screening for malignant neoplasm of prostate: Secondary | ICD-10-CM

## 2018-09-12 DIAGNOSIS — E118 Type 2 diabetes mellitus with unspecified complications: Secondary | ICD-10-CM

## 2018-09-12 DIAGNOSIS — I10 Essential (primary) hypertension: Secondary | ICD-10-CM | POA: Diagnosis not present

## 2018-09-12 DIAGNOSIS — Z0001 Encounter for general adult medical examination with abnormal findings: Secondary | ICD-10-CM

## 2018-09-12 DIAGNOSIS — E1159 Type 2 diabetes mellitus with other circulatory complications: Secondary | ICD-10-CM | POA: Diagnosis not present

## 2018-09-12 DIAGNOSIS — E785 Hyperlipidemia, unspecified: Secondary | ICD-10-CM | POA: Diagnosis not present

## 2018-09-12 DIAGNOSIS — F325 Major depressive disorder, single episode, in full remission: Secondary | ICD-10-CM

## 2018-09-12 DIAGNOSIS — I152 Hypertension secondary to endocrine disorders: Secondary | ICD-10-CM

## 2018-09-12 LAB — COMPREHENSIVE METABOLIC PANEL
ALT: 29 U/L (ref 0–53)
AST: 25 U/L (ref 0–37)
Albumin: 4.7 g/dL (ref 3.5–5.2)
Alkaline Phosphatase: 57 U/L (ref 39–117)
BUN: 13 mg/dL (ref 6–23)
CO2: 29 mEq/L (ref 19–32)
Calcium: 9.6 mg/dL (ref 8.4–10.5)
Chloride: 99 mEq/L (ref 96–112)
Creatinine, Ser: 0.76 mg/dL (ref 0.40–1.50)
GFR: 103.53 mL/min (ref >60.00)
Glucose, Bld: 138 mg/dL — ABNORMAL HIGH (ref 70–99)
Potassium: 4.8 mEq/L (ref 3.5–5.1)
Sodium: 138 mEq/L (ref 135–145)
Total Bilirubin: 0.4 mg/dL (ref 0.2–1.2)
Total Protein: 7.3 g/dL (ref 6.0–8.3)

## 2018-09-12 LAB — LIPID PANEL
Cholesterol: 157 mg/dL (ref 0–200)
HDL: 63.5 mg/dL (ref >39.00)
LDL Cholesterol: 58 mg/dL (ref 0–99)
NonHDL: 93.54
Total CHOL/HDL Ratio: 2
Triglycerides: 179 mg/dL — ABNORMAL HIGH (ref 0.0–149.0)
VLDL: 35.8 mg/dL (ref 0.0–40.0)

## 2018-09-12 LAB — PSA: PSA: 1.29 ng/mL (ref 0.10–4.00)

## 2018-09-12 LAB — CBC
HCT: 41.6 % (ref 39.0–52.0)
Hemoglobin: 14 g/dL (ref 13.0–17.0)
MCHC: 33.7 g/dL (ref 30.0–36.0)
MCV: 88.8 fl (ref 78.0–100.0)
Platelets: 289 10*3/uL (ref 150.0–400.0)
RBC: 4.69 Mil/uL (ref 4.22–5.81)
RDW: 14.6 % (ref 11.5–15.5)
WBC: 9.2 10*3/uL (ref 4.0–10.5)

## 2018-09-12 LAB — TSH: TSH: 1.54 u[IU]/mL (ref 0.35–4.50)

## 2018-09-12 LAB — HEMOGLOBIN A1C: Hgb A1c MFr Bld: 9.5 % — ABNORMAL HIGH (ref 4.6–6.5)

## 2018-09-12 MED ORDER — EZETIMIBE 10 MG PO TABS: 10.0000 mg | ORAL_TABLET | Freq: Every day | ORAL | 3 refills | Status: DC

## 2018-09-12 MED ORDER — ATORVASTATIN CALCIUM 40 MG PO TABS: 40.0000 mg | ORAL_TABLET | Freq: Every day | ORAL | 3 refills | Status: DC

## 2018-09-12 MED ORDER — ESCITALOPRAM OXALATE 20 MG PO TABS: 20.0000 mg | ORAL_TABLET | Freq: Every day | ORAL | 3 refills | Status: DC

## 2018-09-12 MED ORDER — PANTOPRAZOLE SODIUM 40 MG PO TBEC: 40.0000 mg | DELAYED_RELEASE_TABLET | Freq: Every day | ORAL | 3 refills | Status: DC

## 2018-09-12 MED ORDER — METFORMIN HCL 1000 MG PO TABS: 1000.0000 mg | ORAL_TABLET | Freq: Two times a day (BID) | ORAL | 3 refills | Status: DC

## 2018-09-12 MED ORDER — EMPAGLIFLOZIN 10 MG PO TABS: 10.0000 mg | ORAL_TABLET | Freq: Every day | ORAL | 90 refills | Status: DC

## 2018-09-12 MED ORDER — LOSARTAN POTASSIUM 100 MG PO TABS: 100.0000 mg | ORAL_TABLET | Freq: Every day | ORAL | 3 refills | Status: DC

## 2018-09-12 MED ORDER — LIRAGLUTIDE 18 MG/3ML ~~LOC~~ SOPN: PEN_INJECTOR | SUBCUTANEOUS | 3 refills | Status: DC

## 2018-09-12 MED FILL — EZETIMIBE 10 MG TABS: 10 | 90 days supply | Qty: 90 | Fill #0

## 2018-09-12 MED FILL — metFORMIN HCL 1000 MG TABS: 1000 | 90 days supply | Qty: 180 | Fill #0

## 2018-09-12 MED FILL — LOSARTAN POTASSIUM 100 MG T: 100 | 30 days supply | Qty: 30 | Fill #0

## 2018-09-12 MED FILL — ESCITALOPRAM 20 MG TABLET: 20 | 90 days supply | Qty: 90 | Fill #0

## 2018-09-12 MED FILL — ATORVASTATIN 40 MG TABLET: 40 | 90 days supply | Qty: 90 | Fill #0

## 2018-09-12 MED FILL — PANTOPRAZOLE SOD DR 40 MG T: 40 | 90 days supply | Qty: 90 | Fill #0

## 2018-09-12 NOTE — Patient Instructions (Signed)
It was very nice to see you today!  We will increase your Lexapro to 20 mg daily.  No other medication changes today.  We will check blood work today.  Please continue working on diet and exercise.  I would like to see back in 3 to 6 months depending on your [name].  Take care, Dr [name]

## 2018-09-12 NOTE — Assessment & Plan Note (Signed)
At goal.  Continue losartan 100 mg daily.  Check CBC, C met, and TSH. 

## 2018-09-12 NOTE — Telephone Encounter (Signed)
See note

## 2018-09-12 NOTE — Assessment & Plan Note (Signed)
Check [name].  Continue Jardiance 10 mg daily, metformin 1000 mg twice daily, and Victoza 1.2 mg daily.

## 2018-09-12 NOTE — Assessment & Plan Note (Signed)
Check lipid panel.  Continue Lipitor 40 mg daily and Zetia 10 mg daily.

## 2018-09-12 NOTE — Assessment & Plan Note (Signed)
Increase lexapro to 20 mg daily.

## 2018-09-12 NOTE — Telephone Encounter (Signed)
Copied from [place] [id number]. Topic: Quick Communication - See Telephone Encounter >> Sep 12, 2018  3:54 PM Vernona Rieger wrote: CRM for notification. See Telephone encounter for: 09/12/18. [facility] called and said the prescription for liraglutide (VICTOZA) 18 MG/3ML SOPN is showing this - Sig: Injections 1.8 mcg every morning. They want to make sure it is still for MG and not MCG, please advise

## 2018-09-12 NOTE — Progress Notes (Signed)
Chief Complaint:  Travis Owens is a 63 y.o. male who presents today for his annual comprehensive physical exam.    Assessment/Plan:  Type 2 diabetes mellitus with complication, without long-term current use of insulin ([name]) Check [name].  Continue Jardiance 10 mg daily, metformin 1000 mg twice daily, and Victoza 1.2 mg daily.  Hypertension associated with diabetes ([place]) At goal.  Continue losartan 100 mg daily.  Check CBC, C met, and TSH.  Hyperlipidemia associated with type 2 diabetes mellitus ([name]) Check lipid panel.  Continue Lipitor 40 mg daily and Zetia 10 mg daily.  Depression, major, single episode, complete remission ([name]) Increase lexapro to 80m daily.  Preventative Healthcare: Check PSA. Foot exam performed today.   Patient Counseling(The following topics were reviewed and/or handout was given):  -Nutrition: Stressed importance of moderation in sodium/caffeine intake, saturated fat and cholesterol, caloric balance, sufficient intake of fresh fruits, vegetables, and fiber.  -Stressed the importance of regular exercise.   -Substance Abuse: Discussed cessation/primary prevention of tobacco, alcohol, or other drug use; driving or other dangerous activities under the influence; availability of treatment for abuse.   -Injury prevention: Discussed safety belts, safety helmets, smoke detector, smoking near bedding or upholstery.   -Sexuality: Discussed sexually transmitted diseases, partner selection, use of condoms, avoidance of unintended pregnancy and contraceptive alternatives.   -Dental health: Discussed importance of regular tooth brushing, flossing, and dental visits.  -Health maintenance and immunizations reviewed. Please refer to Health maintenance section.  Return to care in 1 year for next preventative visit.     Subjective:  HPI:  He has no acute complaints today.   His stable, chronic medical conditions are outlined below:  # T2DM - On metformin 10050mtwice  daily, vitctoza 1.38m638mdaily, and jardiance 61m9mily and tolerating well - Admits to a few dietary indiscretions recently - ROS: No reported polyuria or polydipsia.  # Essential Hypertension - On losartan 100mg24mly and tolerating well - ROS: No chest pain or shortness of breath.  # Dyslipidemia - On lipitor 40mg 70my and Zetia 61mg d24m. Tolerating well - ROS: No reported myalgias.  # GERD - On protonix 40mg an26mlerating well  # Depression - On lexapro 61mg dai29m Has been under increased stress recently.  Interested in increasing dose.  Lifestyle Diet: No specific diets or eating plans.  Exercise: Uses a total a gym several times per week and has been doing a lot of yard work.   Depression screen PHQ 2/9 09/12/2018  Decreased Interest 2  Down, Depressed, Hopeless 1  PHQ - 2 Score 3  Altered sleeping 0  Tired, decreased energy 1  Change in appetite 0  Feeling bad or failure about yourself  1  Trouble concentrating 0  Moving slowly or fidgety/restless 0  Suicidal thoughts 0  PHQ-9 Score 5  Difficult doing work/chores Somewhat difficult    Health Maintenance Due  Topic Date Due  . HIV Screening  07/11/1970  . FOOT EXAM  03/07/2018  . HEMOGLOBIN [name]  03/10/2018  . OPHTHALMOLOGY EXAM  05/12/2018     ROS: Per HPI, otherwise a complete review of systems was negative.   PMH:  The following were reviewed and entered/updated in epic: Past Medical History:  Diagnosis Date  . Anxiety   . Depression   . Diabetes mellitus without complication ([name])   . [place]D (gastroesophageal reflux disease)   . Hyperlipidemia   . Hypertension   . Sleep apnea    has CPAP/does not use  Patient Active Problem List   Diagnosis Date Noted  . Hypertension associated with diabetes ([place]) 12/05/2016  . Type 2 diabetes mellitus with complication, without long-term current use of insulin ([place]) 12/05/2016  . Hyperlipidemia associated with type 2 diabetes mellitus ([place]) 12/05/2016  .  Depression, major, single episode, complete remission ([place]) 12/05/2016   Past Surgical History:  Procedure Laterality Date  . COLONOSCOPY     Dr. Olevia Perches  2008    Family History  Problem Relation Age of Onset  . Cancer Father        Lung Cancer  . Stroke Father   . Hypertension Paternal Grandmother   . Stroke Paternal Grandfather   . Colon cancer Neg Hx   . Colon polyps Neg Hx   . Esophageal cancer Neg Hx   . Rectal cancer Neg Hx   . Stomach cancer Neg Hx     Medications- reviewed and updated Current Outpatient Medications  Medication Sig Dispense Refill  . aspirin EC 81 MG tablet Take 81 mg by mouth daily.    Marland Kitchen atorvastatin (LIPITOR) 40 MG tablet Take 1 tablet (40 mg total) by mouth daily at 6 PM. 90 tablet 3  . empagliflozin (JARDIANCE) 10 MG TABS tablet Take 10 mg by mouth daily. 90 tablet 90  . escitalopram (LEXAPRO) 20 MG tablet Take 1 tablet (20 mg total) by mouth daily. 90 tablet 3  . ezetimibe (ZETIA) 10 MG tablet Take 1 tablet (10 mg total) by mouth daily. 90 tablet 3  . liraglutide (VICTOZA) 18 MG/3ML SOPN Injections 1.8 mcg every morning. 15 pen 3  . losartan (COZAAR) 100 MG tablet Take 1 tablet (100 mg total) by mouth daily. 90 tablet 3  . metFORMIN (GLUCOPHAGE) 1000 MG tablet Take 1 tablet (1,000 mg total) by mouth 2 (two) times daily. 180 tablet 3  . Multiple Vitamin (MULTIVITAMIN) tablet Take 1 tablet by mouth daily.    . pantoprazole (PROTONIX) 40 MG tablet Take 1 tablet (40 mg total) by mouth daily. 90 tablet 3   No current facility-administered medications for this visit.     Allergies-reviewed and updated No Known Allergies  Social History   Socioeconomic History  . Marital status: Married    Spouse name: Not on file  . Number of children: Not on file  . Years of education: Not on file  . Highest education level: Not on file  Occupational History  . Occupation: Theme park manager    Comment: [employer]  Social Needs  . Financial resource  strain: Not on file  . Food insecurity:    Worry: Not on file    Inability: Not on file  . Transportation needs:    Medical: Not on file    Non-medical: Not on file  Tobacco Use  . Smoking status: Never Smoker  . Smokeless tobacco: Never Used  Substance and Sexual Activity  . Alcohol use: No  . Drug use: No  . Sexual activity: Yes  Lifestyle  . Physical activity:    Days per week: Not on file    Minutes per session: Not on file  . Stress: Not on file  Relationships  . Social connections:    Talks on phone: Not on file    Gets together: Not on file    Attends religious service: Not on file    Active member of club or organization: Not on file    Attends meetings of clubs or organizations: Not on file    Relationship status: Not  on file  Other Topics Concern  . Not on file  Social History Narrative  . Not on file        Objective:  Physical Exam: BP 136/84 (BP Location: Left Arm, Patient Position: Sitting, Cuff Size: Large)   Pulse 85   Temp 98.4 F (36.9 C) (Oral)   Ht _0  (1.778 m)   Wt 227 lb (103 kg)   BMI 32.57 kg/m   Body mass index is 32.57 kg/m. Wt Readings from Last 3 Encounters:  09/12/18 227 lb (103 kg)  09/04/17 227 lb (103 kg)  06/07/17 228 lb 9.6 oz (103.7 kg)   Gen: NAD, resting comfortably HEENT: TMs normal bilaterally. OP clear. No thyromegaly noted.  CV: RRR with no murmurs appreciated Pulm: NWOB, CTAB with no crackles, wheezes, or rhonchi GI: Normal bowel sounds present. Soft, Nontender, Nondistended. MSK: no edema, cyanosis, or clubbing noted Skin: warm, dry Neuro: CN2-12 grossly intact. Strength 5/5 in upper and lower extremities. Reflexes symmetric and intact bilaterally.  Psych: Normal affect and thought content     Caleb M. [name], MD 09/12/2018 11:41 AM

## 2018-09-13 ENCOUNTER — Other Ambulatory Visit: Payer: Self-pay

## 2018-09-13 MED ORDER — LIRAGLUTIDE 18 MG/3ML ~~LOC~~ SOPN: PEN_INJECTOR | SUBCUTANEOUS | 3 refills | Status: DC

## 2018-09-13 MED FILL — UNIFINE PENTIPS 31GX3/16: 31G X 5 MM | 90 days supply | Qty: 100 | Fill #0

## 2018-09-13 MED FILL — UNIFINE PENTIPS 31GX3/16": 31G X 5 MM | 90 days supply | Qty: 100 | Fill #0

## 2018-09-13 MED FILL — VICTOZA 18 MG/3 ML INJECT P: 18 | 90 days supply | Qty: 27 | Fill #0

## 2018-09-13 NOTE — Progress Notes (Signed)
Please inform patient of the following:  [name] is elevated to 9.5 but everything else looks great. Recommend increasing jardiance to 25mg  daily if patient wants to make a medication change. Regardless, he should continue to work hard on diet and exercise and we can recheck in 3 months.  Travis Owens. [name], MD 09/13/2018 11:11 AM

## 2018-09-13 NOTE — Telephone Encounter (Signed)
Rx has been corrected.   

## 2018-10-22 ENCOUNTER — Telehealth: Payer: Self-pay | Admitting: Family Medicine

## 2018-10-22 ENCOUNTER — Other Ambulatory Visit: Payer: Self-pay

## 2018-10-22 MED ORDER — FREESTYLE LANCETS MISC: 12 refills | Status: AC

## 2018-10-22 MED ORDER — FREESTYLE LITE TEST VI STRP: ORAL_STRIP | 12 refills | Status: DC

## 2018-10-22 MED ORDER — FREESTYLE LITE DEVI: 0 refills | Status: AC

## 2018-10-22 MED FILL — FREESTYLE LITE TEST STRIP: 50 days supply | Qty: 100 | Fill #0

## 2018-10-22 MED FILL — LOSARTAN POTASSIUM 100 MG T: 100 | 90 days supply | Qty: 90 | Fill #0

## 2018-10-22 MED FILL — FREESTYLE LANCETS: 50 days supply | Qty: 100 | Fill #0

## 2018-10-22 MED FILL — FREESTYLE LITE METER: 25 days supply | Qty: 1 | Fill #0

## 2018-10-22 NOTE — Telephone Encounter (Signed)
Medication Refill - Medication: empagliflozin (JARDIANCE) 10 MG TABS tablet  Has the patient contacted their pharmacy? No - states that script needs to change to 25mg .  Pt has been taking 2 tablets to get rid of the 10mg  tablets he had and start the increase to the 25mg . (Agent: If no, request that the patient contact the pharmacy for the refill.) (Agent: If yes, when and what did the pharmacy advise?)  Preferred Pharmacy (with phone number or street name):     [place], [place] - [address] [phone] (Phone) [phone] (Fax)     Agent: Please be advised that RX refills may take up to 3 business days. We ask that you follow-up with your pharmacy.

## 2018-10-22 NOTE — Telephone Encounter (Signed)
Rx sent 

## 2018-10-22 NOTE — Telephone Encounter (Signed)
Pt also states that he needs a script for the FreeStyle Lite meter and strips/lancets.  States he is involved in the [employer] program through [facility] and this will be free for him, but he needs an RX.

## 2018-11-01 MED ORDER — JARDIANCE 10 MG PO TABS: 10.0000 mg | ORAL_TABLET | Freq: Every day | ORAL | 1 refills | Status: DC

## 2018-11-01 NOTE — Telephone Encounter (Signed)
Rx for Jardiance sent to pharmacy. Called pt and left VM to call the office.

## 2018-11-01 NOTE — Telephone Encounter (Signed)
Patient states pharmacy never received empagliflozin (JARDIANCE) 25 MG TABS tablet , please advise  [place], [place] - [address] [phone] (Phone) [phone] (Fax)    Please advise patient when Rx sent.

## 2018-11-01 NOTE — Telephone Encounter (Signed)
Pt returned call and was advised to [facility] that rx was sent to pharmacy.

## 2018-11-02 ENCOUNTER — Other Ambulatory Visit: Payer: Self-pay

## 2018-11-02 ENCOUNTER — Telehealth: Payer: Self-pay | Admitting: Family Medicine

## 2018-11-02 MED ORDER — EMPAGLIFLOZIN 25 MG PO TABS: 25.0000 mg | ORAL_TABLET | Freq: Every day | ORAL | 3 refills | Status: DC

## 2018-11-02 MED FILL — JARDIANCE 25 MG TABLET: 25 | 90 days supply | Qty: 90 | Fill #0

## 2018-11-02 NOTE — Telephone Encounter (Signed)
Rx for 25 mg has been sent to pharmacy.

## 2018-11-02 NOTE — Telephone Encounter (Addendum)
Patient is going to call because the wrong script was called in the 10mg  was called in.  Patient is requesting this to be sent today.  He has been out of medication for a week. The patient is very frustrated.  Requesting the 25mg  not 10 mg.  empagliflozin (JARDIANCE) 25 MG TABS tablet , please advise  [place], [place] - [address] [phone] (Phone) [phone] (Fax)    Patient states that they will call back today around 2:30p to check on the status.  Please advise patient when Rx sent [phone]

## 2018-11-02 NOTE — Telephone Encounter (Signed)
See note

## 2018-12-17 ENCOUNTER — Ambulatory Visit (INDEPENDENT_AMBULATORY_CARE_PROVIDER_SITE_OTHER): Payer: No Typology Code available for payment source | Admitting: Family Medicine

## 2018-12-17 ENCOUNTER — Encounter: Payer: Self-pay | Admitting: Family Medicine

## 2018-12-17 ENCOUNTER — Other Ambulatory Visit: Payer: Self-pay

## 2018-12-17 VITALS — BP 128/78 | HR 75 | Temp 97.5°F | Ht 70.0 in | Wt 226.2 lb

## 2018-12-17 DIAGNOSIS — Z23 Encounter for immunization: Secondary | ICD-10-CM | POA: Diagnosis not present

## 2018-12-17 DIAGNOSIS — I152 Hypertension secondary to endocrine disorders: Secondary | ICD-10-CM

## 2018-12-17 DIAGNOSIS — E1159 Type 2 diabetes mellitus with other circulatory complications: Secondary | ICD-10-CM | POA: Diagnosis not present

## 2018-12-17 DIAGNOSIS — E785 Hyperlipidemia, unspecified: Secondary | ICD-10-CM

## 2018-12-17 DIAGNOSIS — E118 Type 2 diabetes mellitus with unspecified complications: Secondary | ICD-10-CM

## 2018-12-17 DIAGNOSIS — I10 Essential (primary) hypertension: Secondary | ICD-10-CM

## 2018-12-17 DIAGNOSIS — F325 Major depressive disorder, single episode, in full remission: Secondary | ICD-10-CM

## 2018-12-17 DIAGNOSIS — E1169 Type 2 diabetes mellitus with other specified complication: Secondary | ICD-10-CM | POA: Diagnosis not present

## 2018-12-17 LAB — POCT GLYCOSYLATED HEMOGLOBIN (HGB A1C): Hemoglobin A1C: 8.3 % — AB (ref 4.0–5.6)

## 2018-12-17 NOTE — Progress Notes (Signed)
   Chief Complaint:  Travis Owens is a 63 y.o. male who presents today with a chief complaint of T2DM.   Assessment/Plan:  Depression, major, single episode, complete remission ([name]) Stable.  Continue Lexapro 20 mg daily.  Hyperlipidemia associated with type 2 diabetes mellitus ([name]) Continue Lipitor 40 mg daily and Zetia 10 mill  Type 2 diabetes mellitus with complication, without long-term current use of insulin ([name]) [name] improved to 8.3.  Continue current regimen of metformin 1000 twice daily, Victoza 1.8 mcg daily, and Jardiance 25 mg daily.  Follow-up in 3 to 6 months.  Consider switching to Ozempic if [name] not at goal.  He will continue working on diet exercise.  Hypertension associated with diabetes ([place]) At goal.  Continue losartan 100 mg daily.  Preventative Healthcare Flu vaccine given today.    Subjective:  HPI:  His stable, chronic medical conditions are outlined below:  # T2DM - On metformin 1000mg  twice daily, vitctoza 1.54mcg daily, and jardiance 25mg  daily and tolerating well - Admits to a few dietary indiscretions recently - ROS: No reported polyuria or polydipsia.  # Essential Hypertension - On losartan 100mg  daily and tolerating well - ROS: No chest pain or shortness of breath.  # Dyslipidemia - On lipitor 40mg  daily and Zetia 10mg  daily. Tolerating well - ROS: No reported myalgias.  # GERD - On protonix 40mg  and tolerating well  # Depression - On lexapro 20mg  daily and tolerating well  ROS: Per HPI  PMH: He reports that he has never smoked. He has never used smokeless tobacco. He reports that he does not drink alcohol or use drugs.      Objective:  Physical Exam: BP 128/78   Pulse 75   Temp (!) 97.5 F (36.4 C)   Ht 5\' 10"  (1.778 m)   Wt 226 lb 4 oz (102.6 kg)   SpO2 95%   BMI 32.46 kg/m   Wt Readings from Last 3 Encounters:  12/17/18 226 lb 4 oz (102.6 kg)  09/12/18 227 lb (103 kg)  09/04/17 227 lb (103 kg)  Gen: NAD, resting  comfortably CV: Regular rate and rhythm with no murmurs appreciated Pulm: Normal work of breathing, clear to auscultation bilaterally with no crackles, wheezes, or rhonchi GI: Normal bowel sounds present. Soft, Nontender, Nondistended. MSK: No edema, cyanosis, or clubbing noted Skin: Warm, dry Neuro: Grossly normal, moves all extremities Psych: Normal affect and thought content  Results for orders placed or performed in visit on 12/17/18 (from the past 24 hour(s))  POCT HgB [name]     Status: Abnormal   Collection Time: 12/17/18  9:58 AM  Result Value Ref Range   Hemoglobin [name] 8.3 (A) 4.0 - 5.6 %        Yehia Mcbain M. [name], MD 12/17/2018 10:18 AM

## 2018-12-17 NOTE — Assessment & Plan Note (Signed)
Stable.  Continue Lexapro 20mg daily

## 2018-12-17 NOTE — Patient Instructions (Signed)
It was very nice to see you today!  Your [name] is looking better.  No changes today.  Keep working on diet and exercise.  Come back to see me in 3-6 months or sooner if needed.   Take care, Dr [name]  Please try these tips to maintain a healthy lifestyle:   Eat at least 3 REAL meals and 1-2 snacks per day.  Aim for no more than 5 hours between eating.  If you eat breakfast, please do so within one hour of getting up.    Obtain twice as many fruits/vegetables as protein or carbohydrate foods for both lunch and dinner. (Half of each meal should be fruits/vegetables, one quarter protein, and one quarter starchy carbs)   Cut down on sweet beverages. This includes juice, soda, and sweet tea.    Exercise at least 150 minutes every week.

## 2018-12-17 NOTE — Assessment & Plan Note (Signed)
[name] improved to 8.3.  Continue current regimen of metformin 1000 twice daily, Victoza 1.8 mcg daily, and Jardiance 25 mg daily.  Follow-up in 3 to 6 months.  Consider switching to Ozempic if [name] not at goal.  He will continue working on diet exercise.

## 2018-12-17 NOTE — Assessment & Plan Note (Signed)
Continue Lipitor 40 mg daily and Zetia 10 mill

## 2018-12-17 NOTE — Assessment & Plan Note (Signed)
At goal.  Continue losartan 100 mg daily. ?

## 2018-12-21 MED FILL — metFORMIN HCL 1000 MG TABS: 1000 | 90 days supply | Qty: 180 | Fill #0

## 2018-12-21 MED FILL — PANTOPRAZOLE SOD DR 40 MG T: 40 | 90 days supply | Qty: 90 | Fill #0

## 2018-12-21 MED FILL — EZETIMIBE 10 MG TABS: 10 | 90 days supply | Qty: 90 | Fill #0

## 2018-12-21 MED FILL — ESCITALOPRAM 20 MG TABLET: 20 | 90 days supply | Qty: 90 | Fill #0

## 2018-12-21 MED FILL — ATORVASTATIN 40 MG TABLET: 40 | 90 days supply | Qty: 90 | Fill #0

## 2018-12-28 ENCOUNTER — Telehealth: Payer: Self-pay | Admitting: Family Medicine

## 2018-12-28 ENCOUNTER — Other Ambulatory Visit: Payer: Self-pay

## 2018-12-28 DIAGNOSIS — E669 Obesity, unspecified: Secondary | ICD-10-CM

## 2018-12-28 DIAGNOSIS — E118 Type 2 diabetes mellitus with unspecified complications: Secondary | ICD-10-CM

## 2018-12-28 NOTE — Telephone Encounter (Signed)
Referrals placed 

## 2018-12-28 NOTE — Telephone Encounter (Signed)
Pt's spouse called in requesting a referral to Dr. Jene Every at [facility] .    And also to Dr. Lorra Hals for weight loss    Please assist.

## 2019-01-25 MED FILL — JARDIANCE 25 MG TABLET: 25 | 90 days supply | Qty: 90 | Fill #1

## 2019-01-25 MED FILL — LOSARTAN POTASSIUM 100 MG T: 100 | 90 days supply | Qty: 90 | Fill #1

## 2019-02-04 LAB — HM DIABETES EYE EXAM

## 2019-02-25 MED FILL — VICTOZA 18 MG/3 ML INJECT P: 18 | 90 days supply | Qty: 27 | Fill #0

## 2019-03-05 ENCOUNTER — Encounter: Payer: Self-pay | Admitting: Family Medicine

## 2019-03-29 ENCOUNTER — Other Ambulatory Visit: Payer: Self-pay

## 2019-03-29 DIAGNOSIS — Z20822 Contact with and (suspected) exposure to covid-19: Secondary | ICD-10-CM

## 2019-04-01 LAB — NOVEL CORONAVIRUS, NAA: SARS-CoV-2, NAA: NOT DETECTED

## 2019-04-02 MED FILL — ESCITALOPRAM 20 MG TABLET: 20 | 90 days supply | Qty: 90 | Fill #1

## 2019-04-02 MED FILL — ATORVASTATIN 40 MG TABLET: 40 | 90 days supply | Qty: 90 | Fill #1

## 2019-04-02 MED FILL — metFORMIN HCL 1000 MG TABS: 1000 | 90 days supply | Qty: 180 | Fill #1

## 2019-04-02 MED FILL — EZETIMIBE 10 MG TABS: 10 | 90 days supply | Qty: 90 | Fill #1

## 2019-04-02 MED FILL — PANTOPRAZOLE SOD DR 40 MG T: 40 | 90 days supply | Qty: 90 | Fill #1

## 2019-04-09 ENCOUNTER — Encounter: Payer: Self-pay | Admitting: Family Medicine

## 2019-04-23 MED FILL — JARDIANCE 25 MG TABLET: 25 | 90 days supply | Qty: 90 | Fill #2

## 2019-04-23 MED FILL — LOSARTAN POTASSIUM 100 MG T: 100 | 90 days supply | Qty: 90 | Fill #2

## 2019-04-30 ENCOUNTER — Ambulatory Visit (INDEPENDENT_AMBULATORY_CARE_PROVIDER_SITE_OTHER): Payer: No Typology Code available for payment source | Admitting: Family Medicine

## 2019-04-30 ENCOUNTER — Other Ambulatory Visit: Payer: Self-pay

## 2019-04-30 ENCOUNTER — Encounter (INDEPENDENT_AMBULATORY_CARE_PROVIDER_SITE_OTHER): Payer: Self-pay | Admitting: Family Medicine

## 2019-04-30 VITALS — BP 123/73 | HR 88 | Temp 98.1°F | Ht 68.0 in | Wt 220.0 lb

## 2019-04-30 DIAGNOSIS — Z9189 Other specified personal risk factors, not elsewhere classified: Secondary | ICD-10-CM

## 2019-04-30 DIAGNOSIS — Z1331 Encounter for screening for depression: Secondary | ICD-10-CM

## 2019-04-30 DIAGNOSIS — E669 Obesity, unspecified: Secondary | ICD-10-CM

## 2019-04-30 DIAGNOSIS — R0602 Shortness of breath: Secondary | ICD-10-CM

## 2019-04-30 DIAGNOSIS — E119 Type 2 diabetes mellitus without complications: Secondary | ICD-10-CM | POA: Diagnosis not present

## 2019-04-30 DIAGNOSIS — I447 Left bundle-branch block, unspecified: Secondary | ICD-10-CM

## 2019-04-30 DIAGNOSIS — Z0289 Encounter for other administrative examinations: Secondary | ICD-10-CM

## 2019-04-30 DIAGNOSIS — E782 Mixed hyperlipidemia: Secondary | ICD-10-CM

## 2019-04-30 DIAGNOSIS — R5383 Other fatigue: Secondary | ICD-10-CM | POA: Diagnosis not present

## 2019-04-30 DIAGNOSIS — I10 Essential (primary) hypertension: Secondary | ICD-10-CM

## 2019-04-30 DIAGNOSIS — Z6833 Body mass index (BMI) 33.0-33.9, adult: Secondary | ICD-10-CM

## 2019-05-01 LAB — COMPREHENSIVE METABOLIC PANEL
ALT: 30 IU/L (ref 0–44)
AST: 26 IU/L (ref 0–40)
Albumin/Globulin Ratio: 1.8 (ref 1.2–2.2)
Albumin: 4.5 g/dL (ref 3.8–4.8)
Alkaline Phosphatase: 73 IU/L (ref 39–117)
BUN/Creatinine Ratio: 21 (ref 10–24)
BUN: 13 mg/dL (ref 8–27)
Bilirubin Total: 0.3 mg/dL (ref 0.0–1.2)
CO2: 20 mmol/L (ref 20–29)
Calcium: 9.5 mg/dL (ref 8.6–10.2)
Chloride: 101 mmol/L (ref 96–106)
Creatinine, Ser: 0.62 mg/dL — ABNORMAL LOW (ref 0.76–1.27)
GFR calc Af Amer: 122 mL/min/{1.73_m2} (ref >59)
GFR calc non Af Amer: 106 mL/min/{1.73_m2} (ref >59)
Globulin, Total: 2.5 g/dL (ref 1.5–4.5)
Glucose: 183 mg/dL — ABNORMAL HIGH (ref 65–99)
Potassium: 4.4 mmol/L (ref 3.5–5.2)
Sodium: 138 mmol/L (ref 134–144)
Total Protein: 7 g/dL (ref 6.0–8.5)

## 2019-05-01 LAB — CBC WITH DIFFERENTIAL/PLATELET
Basophils Absolute: 0.1 10*3/uL (ref 0.0–0.2)
Basos: 1 %
EOS (ABSOLUTE): 0.3 10*3/uL (ref 0.0–0.4)
Eos: 3 %
Hematocrit: 42.1 % (ref 37.5–51.0)
Hemoglobin: 14.1 g/dL (ref 13.0–17.7)
Immature Grans (Abs): 0 10*3/uL (ref 0.0–0.1)
Immature Granulocytes: 0 %
Lymphocytes Absolute: 2.3 10*3/uL (ref 0.7–3.1)
Lymphs: 28 %
MCH: 29.2 pg (ref 26.6–33.0)
MCHC: 33.5 g/dL (ref 31.5–35.7)
MCV: 87 fL (ref 79–97)
Monocytes Absolute: 0.6 10*3/uL (ref 0.1–0.9)
Monocytes: 8 %
Neutrophils Absolute: 5 10*3/uL (ref 1.4–7.0)
Neutrophils: 60 %
Platelets: 273 10*3/uL (ref 150–450)
RBC: 4.83 x10E6/uL (ref 4.14–5.80)
RDW: 13.8 % (ref 11.6–15.4)
WBC: 8.3 10*3/uL (ref 3.4–10.8)

## 2019-05-01 LAB — VITAMIN D 25 HYDROXY (VIT D DEFICIENCY, FRACTURES): Vit D, 25-Hydroxy: 25.8 ng/mL — ABNORMAL LOW (ref 30.0–100.0)

## 2019-05-01 LAB — LIPID PANEL WITH LDL/HDL RATIO
Cholesterol, Total: 163 mg/dL (ref 100–199)
HDL: 55 mg/dL (ref >39)
LDL Chol Calc (NIH): 75 mg/dL (ref 0–99)
LDL/HDL Ratio: 1.4 ratio (ref 0.0–3.6)
Triglycerides: 201 mg/dL — ABNORMAL HIGH (ref 0–149)
VLDL Cholesterol Cal: 33 mg/dL (ref 5–40)

## 2019-05-01 LAB — HEMOGLOBIN A1C
Est. average glucose Bld gHb Est-mCnc: 226 mg/dL
Hgb A1c MFr Bld: 9.5 % — ABNORMAL HIGH (ref 4.8–5.6)

## 2019-05-01 LAB — MICROALBUMIN / CREATININE URINE RATIO
Creatinine, Urine: 48.2 mg/dL
Microalb/Creat Ratio: 7 mg/g creat (ref 0–29)
Microalbumin, Urine: 3.6 ug/mL

## 2019-05-01 LAB — FOLATE: Folate: 19.8 ng/mL (ref >3.0)

## 2019-05-01 LAB — T4, FREE: Free T4: 0.93 ng/dL (ref 0.82–1.77)

## 2019-05-01 LAB — T3: T3, Total: 96 ng/dL (ref 71–180)

## 2019-05-01 LAB — INSULIN, RANDOM: INSULIN: 24.2 u[IU]/mL (ref 2.6–24.9)

## 2019-05-01 LAB — TSH: TSH: 1.46 u[IU]/mL (ref 0.450–4.500)

## 2019-05-01 LAB — VITAMIN B12: Vitamin B-12: 578 pg/mL (ref 232–1245)

## 2019-05-02 ENCOUNTER — Ambulatory Visit (INDEPENDENT_AMBULATORY_CARE_PROVIDER_SITE_OTHER): Payer: No Typology Code available for payment source | Admitting: Cardiology

## 2019-05-02 ENCOUNTER — Other Ambulatory Visit: Payer: Self-pay

## 2019-05-02 ENCOUNTER — Encounter: Payer: Self-pay | Admitting: Cardiology

## 2019-05-02 VITALS — BP 126/76 | HR 82 | Ht 68.0 in | Wt 225.0 lb

## 2019-05-02 DIAGNOSIS — R072 Precordial pain: Secondary | ICD-10-CM

## 2019-05-02 DIAGNOSIS — E785 Hyperlipidemia, unspecified: Secondary | ICD-10-CM

## 2019-05-02 DIAGNOSIS — I447 Left bundle-branch block, unspecified: Secondary | ICD-10-CM

## 2019-05-02 DIAGNOSIS — I10 Essential (primary) hypertension: Secondary | ICD-10-CM | POA: Diagnosis not present

## 2019-05-02 DIAGNOSIS — R0782 Intercostal pain: Secondary | ICD-10-CM | POA: Diagnosis not present

## 2019-05-02 MED ORDER — METOPROLOL TARTRATE 100 MG PO TABS: 100.0000 mg | ORAL_TABLET | Freq: Once | ORAL | 0 refills | Status: DC

## 2019-05-02 MED FILL — METOPROLOL TARTRATE 100 MG: 100 | 1 days supply | Qty: 1 | Fill #0

## 2019-05-02 NOTE — Progress Notes (Signed)
Cardiology Office Note:    Date:  05/02/2019   ID:  Travis Owens, DOB December 22, 1955, MRN [id number]  PCP:  [name], MD  Cardiologist:  No primary care provider on file.  Electrophysiologist:  None   Referring MD: Starlyn Skeans, MD   Chief Complaint  Patient presents with  . Chest Pain    History of Present Illness:    Travis Owens is a 64 y.o. male with a hx of type 2 diabetes, hypertension, hyperlipidemia, OSA who is referred by Dr. Leafy Ro for evaluation of left bundle branch block.  He was referred to Dr. Leafy Ro for weight loss evaluation, an EKG was done which showed left bundle branch block.  No baseline EKG on file.  He does report that he gets left-sided chest pain, occurs about once or twice per year.  States that he feels pressure over left side of chest, 3 out of 10 in intensity.  States that it occurs when he is under significant stress.  He has not noted chest pain with activity.  Reports most exertion he does is going for walks, will walk for 30 minutes or so, about 1.5 miles.  He denies any palpitations or syncope.  Does report that he gets lightheaded sometimes with standing.  Reports BP has been well controlled on losartan.  Diabetes has been poorly controlled, last [name] 9.5.  No smoking history.  No heart disease in immediate family.   Past Medical History:  Diagnosis Date  . Anxiety   . Chest pain   . Depression   . Diabetes mellitus without complication ([place])   . Fatty liver   . GERD (gastroesophageal reflux disease)   . Hyperlipidemia   . Hypertension   . Sleep apnea    has CPAP/does not use    Past Surgical History:  Procedure Laterality Date  . COLONOSCOPY     Dr. Olevia Perches  2008    Current Medications: Current Meds  Medication Sig  . aspirin EC 81 MG tablet Take 81 mg by mouth daily.  Marland Kitchen atorvastatin (LIPITOR) 40 MG tablet Take 1 tablet (40 mg total) by mouth daily at 6 PM.  . Blood Glucose Monitoring Suppl (FREESTYLE LITE) DEVI Check  Blood sugar twice daily  . empagliflozin (JARDIANCE) 25 MG TABS tablet Take 25 mg by mouth daily.  Marland Kitchen escitalopram (LEXAPRO) 20 MG tablet Take 1 tablet (20 mg total) by mouth daily.  Marland Kitchen ezetimibe (ZETIA) 10 MG tablet Take 1 tablet (10 mg total) by mouth daily.  Marland Kitchen glucose blood (FREESTYLE LITE) test strip Check Blood sugar twice daily  . Lancets (FREESTYLE) lancets Check blood sugar twice daily  . liraglutide (VICTOZA) 18 MG/3ML SOPN Inject 1.2 mg into the skin daily.   Marland Kitchen losartan (COZAAR) 100 MG tablet Take 1 tablet (100 mg total) by mouth daily.  . metFORMIN (GLUCOPHAGE) 1000 MG tablet Take 1 tablet (1,000 mg total) by mouth 2 (two) times daily.  . Multiple Vitamin (MULTIVITAMIN) tablet Take 1 tablet by mouth daily.  . pantoprazole (PROTONIX) 40 MG tablet Take 1 tablet (40 mg total) by mouth daily.     Allergies:   Patient has no known allergies.   Social History   Socioeconomic History  . Marital status: Married    Spouse name: Travis Owens  . Number of children: Not on file  . Years of education: Not on file  . Highest education level: Not on file  Occupational History  . Occupation: Theme park manager    Comment:  [employer]  Tobacco Use  . Smoking status: Never Smoker  . Smokeless tobacco: Never Used  Substance and Sexual Activity  . Alcohol use: No  . Drug use: No  . Sexual activity: Yes  Other Topics Concern  . Not on file  Social History Narrative  . Not on file   Social Determinants of Health   Financial Resource Strain:   . Difficulty of Paying Living Expenses: Not on file  Food Insecurity:   . Worried About Charity fundraiser in the Last Year: Not on file  . Ran Out of Food in the Last Year: Not on file  Transportation Needs:   . Lack of Transportation (Medical): Not on file  . Lack of Transportation (Non-Medical): Not on file  Physical Activity:   . Days of Exercise per Week: Not on file  . Minutes of Exercise per Session: Not on file  Stress:   .  Feeling of Stress : Not on file  Social Connections:   . Frequency of Communication with Friends and Family: Not on file  . Frequency of Social Gatherings with Friends and Family: Not on file  . Attends Religious Services: Not on file  . Active Member of Clubs or Organizations: Not on file  . Attends Archivist Meetings: Not on file  . Marital Status: Not on file     Family History: The patient's family history includes Cancer in his father; Hypertension in his paternal grandmother; Stroke in his father and paternal grandfather. There is no history of Colon cancer, Colon polyps, Esophageal cancer, Rectal cancer, or Stomach cancer.  ROS:   Please see the history of present illness.     All other systems reviewed and are negative.  EKGs/Labs/Other Studies Reviewed:    The following studies were reviewed today:   EKG:  EKG is  ordered today.  The ekg ordered today demonstrates normal sinus rhythm, rate 82, left bundle branch block  Recent Labs: 04/30/2019: ALT 30; BUN 13; Creatinine, Ser 0.62; Hemoglobin 14.1; Platelets 273; Potassium 4.4; Sodium 138; TSH 1.460  Recent Lipid Panel    Component Value Date/Time   CHOL 163 04/30/2019 1145   TRIG 201 (H) 04/30/2019 1145   HDL 55 04/30/2019 1145   CHOLHDL 2 09/12/2018 1100   VLDL 35.8 09/12/2018 1100   LDLCALC 75 04/30/2019 1145    Physical Exam:    VS:  BP 126/76   Pulse 82   Ht 5\' 8"  (1.727 m)   Wt 225 lb (102.1 kg)   BMI 34.21 kg/m     Wt Readings from Last 3 Encounters:  05/02/19 225 lb (102.1 kg)  04/30/19 220 lb (99.8 kg)  12/17/18 226 lb 4 oz (102.6 kg)     GEN:   in no acute distress HEENT: Normal NECK: No JVD LYMPHATICS: No lymphadenopathy CARDIAC:RRR, no murmurs, rubs, gallops RESPIRATORY:  Clear to auscultation without rales, wheezing or rhonchi  ABDOMEN: Soft, non-tender, non-distended MUSCULOSKELETAL:  No edema; No deformity  SKIN: Warm and dry NEUROLOGIC:  Alert and oriented x 3 PSYCHIATRIC:   Normal affect   ASSESSMENT:    1. LBBB (left bundle branch block)   2. Precordial pain   3. Intercostal pain   4. Essential hypertension   5. Hyperlipidemia, unspecified hyperlipidemia type    PLAN:     Chest pain: Atypical in description, as describes left-sided pressure that occurs with stress.  Has not noted relationship with exertion.  Given risk factors, including age,  uncontrolled diabetes, hypertension, hyperlipidemia, would classify as intermediate to high risk of obstructive coronary disease and warrants further evaluation -Coronary CTA  Left bundle branch block: Will check TTE to evaluate for structural heart disease  Hypertension: On losartan 100 mg daily.  Appears well controlled  Hyperlipidemia: Atorvastatin 40 mg daily and Zetia 10 mg daily.  Last LDL 75 on 09/12/2018.  If significant coronary disease on CTA, will increase atorvastatin dose for target LDL less than 70  Type 2 diabetes: [name] 9.5.   On metformin, Victoza, and Jardiance   OSA: diagnosed, does not use CPAP.  Has claustrophobia, did not tolerate.  RTC in 3 months  Medication Adjustments/Labs and Tests Ordered: Current medicines are reviewed at length with the patient today.  Concerns regarding medicines are outlined above.  Orders Placed This Encounter  Procedures  . CT CORONARY MORPH W/CTA COR W/SCORE W/CA W/CM &/OR WO/CM  . CT CORONARY FRACTIONAL FLOW RESERVE DATA PREP  . CT CORONARY FRACTIONAL FLOW RESERVE FLUID ANALYSIS  . Basic metabolic panel  . EKG 12-Lead  . ECHOCARDIOGRAM COMPLETE   Meds ordered this encounter  Medications  . metoprolol tartrate (LOPRESSOR) 100 MG tablet    Sig: Take 1 tablet (100 mg total) by mouth once for 1 dose. 2 hours prior to your scheduled CTA.    Dispense:  1 tablet    Refill:  0    Patient Instructions  Medication Instructions:  Your physician recommends that you continue on your current medications as directed. Please refer to the Current Medication list  given to you today.  *If you need a refill on your cardiac medications before your next appointment, please call your pharmacy*  Lab Work: Your physician recommends that you return for lab work within 7 days of scheduled CTA: BMET. You do not need an appointment for blood work done in our office. Please bring your lab slips with you.  If you have labs (blood work) drawn today and your tests are completely normal, you will receive your results only by: Marland Kitchen MyChart Message (if you have MyChart) OR . A paper copy in the mail If you have any lab test that is abnormal or we need to change your treatment, we will call you to review the results.  Testing/Procedures: Your physician has requested that you have an echocardiogram. Echocardiography is a painless test that uses sound waves to create images of your heart. It provides your doctor with information about the size and shape of your heart and how well your heart's chambers and valves are working. This procedure takes approximately one hour. There are no restrictions for this procedure.    Your cardiac CT will be scheduled at one of the below locations:   [facility] [address] [place], [place] [address] [phone]  If scheduled at [facility], please arrive at the [facility] main entrance of [facility] 30-45 minutes prior to test start time. Proceed to the [facility] Radiology Department (first floor) to check-in and test prep.  Please follow these instructions carefully (unless otherwise directed):  Hold all erectile dysfunction medications at least 3 days (72 hrs) prior to test.  On the Night Before the Test: . Be sure to Drink plenty of water. . Do not consume any caffeinated/decaffeinated beverages or chocolate 12 hours prior to your test. . Do not take any antihistamines 12 hours prior to your test.  On the Day of the Test: . Drink plenty of water. Do not  drink any water within one hour of  the test. . Do not eat any food 4 hours prior to the test. . You may take your regular medications prior to the test.  . Take metoprolol (Lopressor) two hours prior to test. . HOLD Losartan morning of the test.    After the Test: . Drink plenty of water. . After receiving IV contrast, you may experience a mild flushed feeling. This is normal. . On occasion, you may experience a mild rash up to 24 hours after the test. This is not dangerous. If this occurs, you can take Benadryl 25 mg and increase your fluid intake. . If you experience trouble breathing, this can be serious. If it is severe call 911 IMMEDIATELY. If it is mild, please call our office. . If you take any of these medications: Glipizide/Metformin, Avandament, Glucavance, please do not take 48 hours after completing test unless otherwise instructed.   Once we have confirmed authorization from your insurance company, we will call you to set up a date and time for your test.   For non-scheduling related questions, please contact the cardiac imaging nurse navigator should you have any questions/concerns:  Marchia Bond, RN Navigator Cardiac Imaging [facility] [phone] Office     Follow-Up: At [facility], you and your health needs are our priority.  As part of our continuing mission to provide you with exceptional heart care, we have created designated Provider Care Teams.  These Care Teams include your primary Cardiologist (physician) and Advanced Practice Providers (APPs -  Physician Assistants and Nurse Practitioners) who all work together to provide you with the care you need, when you need it.  Your next appointment:   3 month(s)  The format for your next appointment:   Either In Person or Virtual  Provider:   You may see Oswaldo Milian, MD or one of the following Advanced Practice Providers on your designated Care Team:    Rosaria Ferries, PA-C  Jory Sims, DNP,  ANP  Cadence Kathlen Mody, NP       Signed, Donato Heinz, MD  05/02/2019 9:47 AM    [place]

## 2019-05-02 NOTE — Progress Notes (Signed)
Subjective:   Chief Complaint: OBESITY Travis Owens (MR# [id number]) is a 64 y.o. male who presents for evaluation and treatment of obesity and related comorbidities. Current BMI is Body mass index is 33.45 kg/m. Travis Owens has been struggling with his weight for many years and has been unsuccessful in either losing weight, maintaining weight loss, or reaching his healthy weight goal.  Travis Owens states he is currently in the action stage of change and ready to dedicate time achieving and maintaining a healthier weight. Travis Owens is interested in becoming our patient and working on intensive lifestyle modifications including (but not limited to) diet and exercise for weight loss.  Travis Owens's habits were reviewed today and are as follows: His family eats meals together, he thinks his family will eat healthier with him, his desired weight loss is 50 lbs, he started gaining weight in his mid 50's, his heaviest weight ever was 250 pounds, he has significant food cravings issues, he snacks frequently in the evenings, he is frequently drinking liquids with calories, he frequently makes poor food choices, he frequently eats larger portions than normal and he struggles with emotional eating.  Depression Screen Travis Owens's Food and Mood (modified PHQ-9) score was  Depression screen PHQ 2/9 04/30/2019  Decreased Interest 2  Down, Depressed, Hopeless 1  PHQ - 2 Score 3  Altered sleeping 2  Tired, decreased energy 3  Change in appetite 2  Feeling bad or failure about yourself  0  Trouble concentrating 0  Moving slowly or fidgety/restless 0  Suicidal thoughts 0  PHQ-9 Score 10  Difficult doing work/chores Not difficult at all   1. Other fatigue Travis Owens feels his energy is lower than it should be. This has worsened with weight gain and has not worsened recently. Travis Owens admits to daytime somnolence and  admits to waking up still tired. Patient is at risk for obstructive sleep apnea. Patent has a history of symptoms of  daytime fatigue. Patient generally gets 9 hours of sleep per night, and states they generally have generally restful sleep. Snoring is present. Apneic episodes are present. Epworth Sleepiness Score is 9.  2. Shortness of breath on exertion Travis Owens notes increasing shortness of breath with exercising and seems to be worsening over time with weight gain. He notes getting out of breath sooner with activity than he used to. This has not gotten worse recently. Travis Owens denies shortness of breath at rest or orthopnea.  3. Type 2 diabetes mellitus without complication, without long-term current use of insulin ([name]) Travis Owens's recent [name] was elevated at 8.3, but is down from 9.5 last year. He states he is not checking his blood sugar at home very often. He is on Victoza at 1.2 mg and other medications.  4. Essential hypertension Travis Owens's blood pressure is well controlled on his medications. He would like to improve with diet. He denies chest pain.  5. LBBB (left bundle branch block)  Travis Owens has no previous EKG for comparison. (This is a new diagnosis). He denies palpitations, but notes fatigue.  6. Mixed hyperlipidemia Travis Owens is on statin and is working on diet. He denies chest pain.  7. Depression screening Travis Owens depression screening is 10.  8. At risk for heart disease Travis Owens is at a higher than average risk for cardiovascular disease due to obesity. Reviewed: taking medications as instructed, no medication side effects noted, no TIA's, no chest pain on exertion and no swelling of ankles.  Assessment/Plan:   1. Other fatigue Travis Owens  was informed fatigue may be related to obesity, depression or many other causes. Labs will be ordered today, and in the meanwhile Travis Owens has agreed to work on diet, exercise and weight loss.  - EKG 12-Lead - B12 - CBC w/Diff/Platelet - Folate - T3 - T4, free - TSH - Vitamin D (25 hydroxy)  2. Shortness of breath on exertion Travis Owens's shortness of breath appears to be  obesity related and exercise induced. He has agreed to work on weight loss and gradually increase exercise to treat his exercise induced shortness of breath. We will continue to monitor closely.  3. Type 2 diabetes mellitus without complication, without long-term current use of insulin ([name]) We will check labs today. Travis Owens will start his diet prescription. Travis Owens has been given diabetes education by myself today. Good blood sugar control is important to decrease the likelihood of diabetic complications such as nephropathy, neuropathy, limb loss, blindness, coronary artery disease, and death. Intensive lifestyle modification including diet, exercise and weight loss were discussed as the first line treatment for diabetes. We will follow closely.  - Urine Microalbumin w/creat. ratio - Comprehensive Metabolic Panel (CMET) - HgB [name] - Insulin, random  4. Essential hypertension Travis Owens will start his diet and will work on healthy weight loss and exercise to improve blood pressure control. We will watch for signs of hypotension as he continues his lifestyle modifications. We will follow closely.  5. LBBB (left bundle branch block) We have referred Travis Owens to Cardiology for evaluation, and he is to continue to work on co-morbidities.   - Ambulatory referral to Cardiology  6. Mixed hyperlipidemia Cardiovascular risk and specific lipid/LDL goals reviewed. We discussed several lifestyle modifications today and Travis Owens will start his diet, exercise and weight loss efforts. We will check labs today. Orders and follow up as documented in patient record.   Counseling Intensive lifestyle modifications are the first line treatment for this issue. . Dietary changes: Increase soluble fiber. Decrease simple carbohydrates. . Exercise changes: Moderate to vigorous-intensity aerobic activity 150 minutes per week if tolerated. . Lipid-lowering medications: see documented in medical record.  - Lipid Panel With LDL/HDL  Ratio  7. Depression screening Travis Owens had a positive depression screening. Depression is commonly associated with obesity and often results in emotional eating behaviors. We will monitor this closely and work on CBT to help improve the non-hunger eating patterns. Referral to Psychology may be required if no improvement is seen as he continues in our clinic.  8. At risk for heart disease Travis Owens was given (~30 minutes) coronary artery disease prevention counseling today. He is 64 y.o. male and has risk factors for heart disease including obesity. We discussed intensive lifestyle modifications today with an emphasis on specific weight loss instructions and strategies.   9. Class 1 obesity with serious comorbidity and body mass index (BMI) of 33.0 to 33.9 in adult, unspecified obesity type Travis Owens is currently in the action stage of change and his goal is to continue with weight loss efforts. I recommend Travis Owens begin the structured treatment plan as follows:  He has agreed to Category 4 Plan.  We discussed the following exercise goals today: For substantial health benefits, adults should do at least 150 minutes (2 hours and 30 minutes) a week of moderate-intensity, or 75 minutes (1 hour and 15 minutes) a week of vigorous-intensity aerobic physical activity, or an equivalent combination of moderate- and vigorous-intensity aerobic activity. Aerobic activity should be performed in episodes of at least 10 minutes, and preferably, it should  be spread throughout the week. Adults should also include muscle-strengthening activities that involve all major muscle groups on 2 or more days a week. We discussed the following behavioral modification strategies today: increasing lean protein intake and no skipping meals.  He was informed of the importance of frequent follow-up visits to maximize his success with intensive lifestyle modifications for his multiple health conditions. He was informed we would discuss his lab results  at his next visit unless there is a critical issue that needs to be addressed sooner. Travis Owens agreed to keep his next visit at the agreed upon time to discuss these results.  Objective:   Blood pressure 123/73, pulse 88, temperature 98.1 F (36.7 C), temperature source Oral, height 5\' 8"  (1.727 m), weight 220 lb (99.8 kg), SpO2 95 %. Body mass index is 33.45 kg/m.  EKG: normal sinus rhythm, LBBB.  Indirect Calorimeter completed today shows a VO2 of 359 and a REE of 2499.  His calculated basal metabolic rate is [id number] thus his basal metabolic rate is better than expected.  General: Cooperative, alert, well developed, in no acute distress. HEENT: Conjunctivae and lids unremarkable. Neck: No thyromegaly.  Cardiovascular: Regular rhythm.  Lungs: Normal work of breathing. Extremities: No edema.  Neurologic: No focal deficits.   Lab Results  Component Value Date   CREATININE 0.62 (L) 04/30/2019   BUN 13 04/30/2019   NA 138 04/30/2019   K 4.4 04/30/2019   CL 101 04/30/2019   CO2 20 04/30/2019   Lab Results  Component Value Date   ALT 30 04/30/2019   AST 26 04/30/2019   ALKPHOS 73 04/30/2019   BILITOT 0.3 04/30/2019   Lab Results  Component Value Date   HGBA1C 9.5 (H) 04/30/2019   HGBA1C 8.3 (A) 12/17/2018   HGBA1C 9.5 (H) 09/12/2018   HGBA1C 7.8 09/07/2017   HGBA1C 10.5 06/07/2017   Lab Results  Component Value Date   INSULIN 24.2 04/30/2019   Lab Results  Component Value Date   TSH 1.460 04/30/2019   Lab Results  Component Value Date   CHOL 163 04/30/2019   HDL 55 04/30/2019   LDLCALC 75 04/30/2019   TRIG 201 (H) 04/30/2019   CHOLHDL 2 09/12/2018   Lab Results  Component Value Date   WBC 8.3 04/30/2019   HGB 14.1 04/30/2019   HCT 42.1 04/30/2019   MCV 87 04/30/2019   PLT 273 04/30/2019   No results found for: IRON, TIBC, FERRITIN  Attestation Statements:   Reviewed by clinician on day of visit: allergies, medications, problem list, medical history,  surgical history, family history, social history and previous encounter notes.  This visit occurred during the SARS-CoV-2 public health emergency.  Safety protocols were in place, including screening questions prior to the visit, additional usage of staff PPE, and extensive cleaning of exam room while observing appropriate contact time as indicated for disinfecting solutions. (CPT [id number])  I, Trixie Dredge, am acting as transcriptionist for Dennard Nip, MD.  I have reviewed the above documentation for accuracy and completeness, and I agree with the above. - Dennard Nip, MD

## 2019-05-02 NOTE — Patient Instructions (Signed)
Medication Instructions:  Your physician recommends that you continue on your current medications as directed. Please refer to the Current Medication list given to you today.  *If you need a refill on your cardiac medications before your next appointment, please call your pharmacy*  Lab Work: Your physician recommends that you return for lab work within 7 days of scheduled CTA: BMET. You do not need an appointment for blood work done in our office. Please bring your lab slips with you.  If you have labs (blood work) drawn today and your tests are completely normal, you will receive your results only by: Marland Kitchen MyChart Message (if you have MyChart) OR . A paper copy in the mail If you have any lab test that is abnormal or we need to change your treatment, we will call you to review the results.  Testing/Procedures: Your physician has requested that you have an echocardiogram. Echocardiography is a painless test that uses sound waves to create images of your heart. It provides your doctor with information about the size and shape of your heart and how well your heart's chambers and valves are working. This procedure takes approximately one hour. There are no restrictions for this procedure.    Your cardiac CT will be scheduled at one of the below locations:   [facility] [address] [place], [place] [address] [phone]  If scheduled at [facility], please arrive at the [facility] main entrance of [facility] 30-45 minutes prior to test start time. Proceed to the [facility] Radiology Department (first floor) to check-in and test prep.  Please follow these instructions carefully (unless otherwise directed):  Hold all erectile dysfunction medications at least 3 days (72 hrs) prior to test.  On the Night Before the Test: . Be sure to Drink plenty of water. . Do not consume any caffeinated/decaffeinated beverages or chocolate 12 hours prior to your  test. . Do not take any antihistamines 12 hours prior to your test.  On the Day of the Test: . Drink plenty of water. Do not drink any water within one hour of the test. . Do not eat any food 4 hours prior to the test. . You may take your regular medications prior to the test.  . Take metoprolol (Lopressor) two hours prior to test. . HOLD Losartan morning of the test.    After the Test: . Drink plenty of water. . After receiving IV contrast, you may experience a mild flushed feeling. This is normal. . On occasion, you may experience a mild rash up to 24 hours after the test. This is not dangerous. If this occurs, you can take Benadryl 25 mg and increase your fluid intake. . If you experience trouble breathing, this can be serious. If it is severe call 911 IMMEDIATELY. If it is mild, please call our office. . If you take any of these medications: Glipizide/Metformin, Avandament, Glucavance, please do not take 48 hours after completing test unless otherwise instructed.   Once we have confirmed authorization from your insurance company, we will call you to set up a date and time for your test.   For non-scheduling related questions, please contact the cardiac imaging nurse navigator should you have any questions/concerns:  Marchia Bond, RN Navigator Cardiac Imaging [facility] [phone] Office     Follow-Up: At [facility], you and your health needs are our priority.  As part of our continuing mission to provide you with exceptional  heart care, we have created designated Provider Care Teams.  These Care Teams include your primary Cardiologist (physician) and Advanced Practice Providers (APPs -  Physician Assistants and Nurse Practitioners) who all work together to provide you with the care you need, when you need it.  Your next appointment:   3 month(s)  The format for your next appointment:   Either In Person or Virtual  Provider:   You may see  Oswaldo Milian, MD or one of the following Advanced Practice Providers on your designated Care Team:    Rosaria Ferries, PA-C  Jory Sims, DNP, ANP  Cadence Kathlen Mody, NP

## 2019-05-03 ENCOUNTER — Ambulatory Visit (HOSPITAL_COMMUNITY): Payer: No Typology Code available for payment source | Attending: Cardiology

## 2019-05-03 DIAGNOSIS — R072 Precordial pain: Secondary | ICD-10-CM | POA: Diagnosis not present

## 2019-05-03 MED ORDER — PERFLUTREN LIPID MICROSPHERE
1.0000 mL | INTRAVENOUS | Status: AC | PRN
  Administered 2019-05-03: 2 mL via INTRAVENOUS

## 2019-05-08 ENCOUNTER — Encounter: Payer: Self-pay | Admitting: Cardiology

## 2019-05-08 ENCOUNTER — Other Ambulatory Visit: Payer: Self-pay

## 2019-05-08 ENCOUNTER — Ambulatory Visit (INDEPENDENT_AMBULATORY_CARE_PROVIDER_SITE_OTHER): Payer: No Typology Code available for payment source | Admitting: Cardiology

## 2019-05-08 VITALS — BP 114/68 | HR 78 | Temp 95.7°F | Ht 68.0 in | Wt 225.0 lb

## 2019-05-08 DIAGNOSIS — I447 Left bundle-branch block, unspecified: Secondary | ICD-10-CM | POA: Diagnosis not present

## 2019-05-08 DIAGNOSIS — R079 Chest pain, unspecified: Secondary | ICD-10-CM | POA: Diagnosis not present

## 2019-05-08 DIAGNOSIS — I10 Essential (primary) hypertension: Secondary | ICD-10-CM | POA: Diagnosis not present

## 2019-05-08 DIAGNOSIS — I519 Heart disease, unspecified: Secondary | ICD-10-CM

## 2019-05-08 DIAGNOSIS — G4730 Sleep apnea, unspecified: Secondary | ICD-10-CM

## 2019-05-08 MED ORDER — METOPROLOL SUCCINATE ER 25 MG PO TB24: 25.0000 mg | ORAL_TABLET | Freq: Every day | ORAL | 6 refills | Status: DC

## 2019-05-08 MED FILL — METOPROLOL SUCCINATE ER 25: 25 | 90 days supply | Qty: 90 | Fill #0

## 2019-05-08 NOTE — Progress Notes (Signed)
Cardiology Office Note:    Date:  05/08/2019   ID:  Jauron Freerksen, DOB [birth year], MRN [id number]  PCP:  [name], MD  Cardiologist:  No primary care provider on file.  Electrophysiologist:  None   Referring MD: [name], MD   Chief Complaint  Patient presents with  . Follow-up    Discuss ECHO.    History of Present Illness:    Travis Owens is a 64 y.o. male with a hx of type 2 diabetes, hypertension, hyperlipidemia, OSA who present for follow-up.  He was is referred by Dr. Leafy Ro for evaluation of left bundle branch block on 05/01/18.  He was referred to Dr. Leafy Ro for weight loss evaluation, an EKG was done which showed left bundle branch block.  No baseline EKG on file.  He does report that he gets left-sided chest pain, occurs about once or twice per year.  States that he feels pressure over left side of chest, 3 out of 10 in intensity.  States that it occurs when he is under significant stress.  He has not noted chest pain with activity.  Reports most exertion he does is going for walks, will walk for 30 minutes or so, about 1.5 miles.  He denies any palpitations or syncope.  Does report that he gets lightheaded sometimes with standing.  Reports BP has been well controlled on losartan.  Diabetes has been poorly controlled, last [name] 9.5.  No smoking history.  No heart disease in immediate family.  TTE was done on 05/03/2019 and showed EF 40 to 45%, with marked marked septal-lateral dyssynchrony consistent with LBBB.  Denies any shortness of breath, palpitations, or syncope.  No recent chest pain.  Reports he was diagnosed with sleep apnea years ago but could not tolerate CPAP machine on so has not been treated.   Past Medical History:  Diagnosis Date  . Anxiety   . Chest pain   . Depression   . Diabetes mellitus without complication ([place])   . Fatty liver   . GERD (gastroesophageal reflux disease)   . Hyperlipidemia   . Hypertension   . Sleep apnea    has  CPAP/does not use    Past Surgical History:  Procedure Laterality Date  . COLONOSCOPY     Dr. Olevia Perches  2008    Current Medications: Current Meds  Medication Sig  . aspirin EC 81 MG tablet Take 81 mg by mouth daily.  Marland Kitchen atorvastatin (LIPITOR) 40 MG tablet Take 1 tablet (40 mg total) by mouth daily at 6 PM.  . Blood Glucose Monitoring Suppl (FREESTYLE LITE) DEVI Check Blood sugar twice daily  . empagliflozin (JARDIANCE) 25 MG TABS tablet Take 25 mg by mouth daily.  Marland Kitchen escitalopram (LEXAPRO) 20 MG tablet Take 1 tablet (20 mg total) by mouth daily.  Marland Kitchen ezetimibe (ZETIA) 10 MG tablet Take 1 tablet (10 mg total) by mouth daily.  Marland Kitchen glucose blood (FREESTYLE LITE) test strip Check Blood sugar twice daily  . Lancets (FREESTYLE) lancets Check blood sugar twice daily  . liraglutide (VICTOZA) 18 MG/3ML SOPN Inject 1.2 mg into the skin daily.   Marland Kitchen losartan (COZAAR) 100 MG tablet Take 1 tablet (100 mg total) by mouth daily.  . metFORMIN (GLUCOPHAGE) 1000 MG tablet Take 1 tablet (1,000 mg total) by mouth 2 (two) times daily.  . Multiple Vitamin (MULTIVITAMIN) tablet Take 1 tablet by mouth daily.  . pantoprazole (PROTONIX) 40 MG tablet Take 1 tablet (40 mg total) by mouth  daily.     Allergies:   Patient has no known allergies.   Social History   Socioeconomic History  . Marital status: Married    Spouse name: Raycen Libonati  . Number of children: Not on file  . Years of education: Not on file  . Highest education level: Not on file  Occupational History  . Occupation: Theme park manager    Comment: [employer]  Tobacco Use  . Smoking status: Never Smoker  . Smokeless tobacco: Never Used  Substance and Sexual Activity  . Alcohol use: No  . Drug use: No  . Sexual activity: Yes  Other Topics Concern  . Not on file  Social History Narrative  . Not on file   Social Determinants of Health   Financial Resource Strain:   . Difficulty of Paying Living Expenses: Not on file  Food Insecurity:     . Worried About Charity fundraiser in the Last Year: Not on file  . Ran Out of Food in the Last Year: Not on file  Transportation Needs:   . Lack of Transportation (Medical): Not on file  . Lack of Transportation (Non-Medical): Not on file  Physical Activity:   . Days of Exercise per Week: Not on file  . Minutes of Exercise per Session: Not on file  Stress:   . Feeling of Stress : Not on file  Social Connections:   . Frequency of Communication with Friends and Family: Not on file  . Frequency of Social Gatherings with Friends and Family: Not on file  . Attends Religious Services: Not on file  . Active Member of Clubs or Organizations: Not on file  . Attends Archivist Meetings: Not on file  . Marital Status: Not on file     Family History: The patient's family history includes Cancer in his father; Hypertension in his paternal grandmother; Stroke in his father and paternal grandfather. There is no history of Colon cancer, Colon polyps, Esophageal cancer, Rectal cancer, or Stomach cancer.  ROS:   Please see the history of present illness.     All other systems reviewed and are negative.  EKGs/Labs/Other Studies Reviewed:    The following studies were reviewed today:   EKG:  EKG is  ordered today.  The ekg ordered today demonstrates normal sinus rhythm, rate 82, left bundle branch block  TTE 05/03/19:  1. Left ventricular ejection fraction, by visual estimation, is 40 to 45%. The left ventricle has mild to moderately decreased function. There is mildly increased left ventricular hypertrophy. There is marked septal-lateral dyssynchrony consistent with  LBBB.  2. Left ventricular diastolic parameters are consistent with Grade I diastolic dysfunction (impaired relaxation).  3. Global right ventricle has normal systolic function.The right ventricular size is normal. No increase in right ventricular wall thickness.  4. Left atrial size was normal.  5. Right atrial size was  normal.  6. The mitral valve is normal in structure. No evidence of mitral valve regurgitation. No evidence of mitral stenosis.  7. The tricuspid valve is normal in structure.  8. The aortic valve is tricuspid. Aortic valve regurgitation is not visualized. Mild aortic valve sclerosis without stenosis  9. TR signal is inadequate for assessing pulmonary artery systolic pressure. 10. The inferior vena cava is normal in size with greater than 50% respiratory variability, suggesting right atrial pressure of 3 mmHg.  Recent Labs: 04/30/2019: ALT 30; BUN 13; Creatinine, Ser 0.62; Hemoglobin 14.1; Platelets 273; Potassium 4.4; Sodium 138; TSH  1.460  Recent Lipid Panel    Component Value Date/Time   CHOL 163 04/30/2019 1145   TRIG 201 (H) 04/30/2019 1145   HDL 55 04/30/2019 1145   CHOLHDL 2 09/12/2018 1100   VLDL 35.8 09/12/2018 1100   LDLCALC 75 04/30/2019 1145    Physical Exam:    VS:  BP 114/68 (BP Location: Left Arm, Patient Position: Sitting, Cuff Size: Normal)   Pulse 78   Temp (!) 95.7 F (35.4 C)   Ht 5\' 8"  (1.727 m)   Wt 225 lb (102.1 kg)   BMI 34.21 kg/m     Wt Readings from Last 3 Encounters:  05/08/19 225 lb (102.1 kg)  05/02/19 225 lb (102.1 kg)  04/30/19 220 lb (99.8 kg)     GEN:   in no acute distress HEENT: Normal NECK: No JVD LYMPHATICS: No lymphadenopathy CARDIAC:RRR, no murmurs, rubs, gallops RESPIRATORY:  Clear to auscultation without rales, wheezing or rhonchi  ABDOMEN: Soft, non-tender, non-distended MUSCULOSKELETAL:  No edema; No deformity  SKIN: Warm and dry NEUROLOGIC:  Alert and oriented x 3 PSYCHIATRIC:  Normal affect   ASSESSMENT:    1. Chest pain of uncertain etiology   2. Systolic dysfunction   3. LBBB (left bundle branch block)   4. Essential hypertension   5. Sleep apnea, unspecified type    PLAN:     Chest pain: Atypical in description, as describes left-sided pressure that occurs with stress.  Has not noted relationship with exertion.   Given risk factors, including age, uncontrolled diabetes, hypertension, hyperlipidemia, would classify as intermediate to high risk of obstructive coronary disease.  Considering systolic dysfunction on TTE, recommend evaluation for CAD -Coronary CTA  Systolic dysfunction: EF 40 to 45% on TTE from 05/02/18, with marked marked septal-lateral dyssynchrony consistent with LBBB. -Continue losartan 100 mg daily -Will add Toprol-XL 25 mg daily -Ischemia evaluation as above  Hypertension: On losartan 100 mg daily.  Appears well controlled, adding Toprol-XL for systolic dysfunction as above  Hyperlipidemia: Atorvastatin 40 mg daily and Zetia 10 mg daily.  Last LDL 75 on 09/12/2018.  If significant coronary disease on CTA, will increase atorvastatin dose for target LDL less than 70  Type 2 diabetes: [name] 9.5.   On metformin, Victoza, and Jardiance   OSA: diagnosed years ago, does not use CPAP.  Has claustrophobia, did not tolerate.  He is agreeable to undergoing reevaluation.  Will order sleep study  RTC in 3 months  Medication Adjustments/Labs and Tests Ordered: Current medicines are reviewed at length with the patient today.  Concerns regarding medicines are outlined above.  Orders Placed This Encounter  Procedures  . Split night study   Meds ordered this encounter  Medications  . metoprolol succinate (TOPROL XL) 25 MG 24 hr tablet    Sig: Take 1 tablet (25 mg total) by mouth daily.    Dispense:  90 tablet    Refill:  6    Patient Instructions  Medication Instructions:  Start taking Toprol XL 25 mg daily Hold Toprol and hold Losartan morning of Coronary CT *If you need a refill on your cardiac medications before your next appointment, please call your pharmacy*  Lab Work: None ordered  Testing/Procedures: Schedule Sleep Study  Follow-Up: At [facility], you and your health needs are our priority.  As part of our continuing mission to provide you with exceptional heart care, we  have created designated Provider Care Teams.  These Care Teams include your primary Cardiologist (physician) and Advanced Practice  Providers (APPs -  Physician Assistants and Nurse Practitioners) who all work together to provide you with the care you need, when you need it.  Your next appointment:  1 month  The format for your next appointment:  Office   Provider: Dr.Dulcy Sida        Signed, Donato Heinz, MD  05/08/2019 10:47 PM    [place]

## 2019-05-08 NOTE — Patient Instructions (Signed)
Medication Instructions:  Start taking Toprol XL 25 mg daily Hold Toprol and hold Losartan morning of Coronary CT *If you need a refill on your cardiac medications before your next appointment, please call your pharmacy*  Lab Work: None ordered  Testing/Procedures: Schedule Sleep Study  Follow-Up: At [facility], you and your health needs are our priority.  As part of our continuing mission to provide you with exceptional heart care, we have created designated Provider Care Teams.  These Care Teams include your primary Cardiologist (physician) and Advanced Practice Providers (APPs -  Physician Assistants and Nurse Practitioners) who all work together to provide you with the care you need, when you need it.  Your next appointment:  1 month  The format for your next appointment:  Office   Provider: Dr.Schumann

## 2019-05-12 ENCOUNTER — Encounter (INDEPENDENT_AMBULATORY_CARE_PROVIDER_SITE_OTHER): Payer: Self-pay | Admitting: Family Medicine

## 2019-05-14 ENCOUNTER — Ambulatory Visit (INDEPENDENT_AMBULATORY_CARE_PROVIDER_SITE_OTHER): Payer: Self-pay | Admitting: Family Medicine

## 2019-05-15 ENCOUNTER — Telehealth: Payer: Self-pay | Admitting: *Deleted

## 2019-05-15 NOTE — Telephone Encounter (Signed)
Patient notified of sleep study and [place] appointments. He was also given quarantine instructions. Patient voiced verbal understanding.

## 2019-05-20 ENCOUNTER — Other Ambulatory Visit: Payer: Self-pay

## 2019-05-20 ENCOUNTER — Encounter (INDEPENDENT_AMBULATORY_CARE_PROVIDER_SITE_OTHER): Payer: Self-pay | Admitting: Family Medicine

## 2019-05-20 ENCOUNTER — Ambulatory Visit (INDEPENDENT_AMBULATORY_CARE_PROVIDER_SITE_OTHER): Payer: No Typology Code available for payment source | Admitting: Family Medicine

## 2019-05-20 VITALS — BP 112/68 | HR 84 | Temp 98.3°F | Ht 68.0 in | Wt 220.0 lb

## 2019-05-20 DIAGNOSIS — E1165 Type 2 diabetes mellitus with hyperglycemia: Secondary | ICD-10-CM

## 2019-05-20 DIAGNOSIS — Z6833 Body mass index (BMI) 33.0-33.9, adult: Secondary | ICD-10-CM

## 2019-05-20 DIAGNOSIS — E559 Vitamin D deficiency, unspecified: Secondary | ICD-10-CM | POA: Diagnosis not present

## 2019-05-20 DIAGNOSIS — Z9189 Other specified personal risk factors, not elsewhere classified: Secondary | ICD-10-CM | POA: Diagnosis not present

## 2019-05-20 DIAGNOSIS — E669 Obesity, unspecified: Secondary | ICD-10-CM | POA: Diagnosis not present

## 2019-05-21 ENCOUNTER — Other Ambulatory Visit (HOSPITAL_COMMUNITY)
Admission: RE | Admit: 2019-05-21 | Discharge: 2019-05-21 | Disposition: A | Discharge status: Home / Self Care | Payer: No Typology Code available for payment source | Source: Ambulatory Visit | Attending: Cardiovascular Disease | Admitting: Cardiovascular Disease

## 2019-05-21 DIAGNOSIS — Z01812 Encounter for preprocedural laboratory examination: Secondary | ICD-10-CM | POA: Diagnosis present

## 2019-05-21 DIAGNOSIS — Z20822 Contact with and (suspected) exposure to covid-19: Secondary | ICD-10-CM | POA: Insufficient documentation

## 2019-05-21 LAB — SARS CORONAVIRUS 2 (TAT 6-24 HRS): SARS Coronavirus 2: NEGATIVE

## 2019-05-21 NOTE — Progress Notes (Signed)
Chief Complaint:   OBESITY Travis Owens is here to discuss his progress with his obesity treatment plan along with follow-up of his obesity related diagnoses. Travis Owens is on the Category 4 Plan and states he is following his eating plan approximately 0% of the time. Travis Owens states he is exercising 0 minutes 0 times per week.  Today's visit was #: 2 Starting weight: 220 lbs Starting date: 04/30/2019 Today's weight: 220 lbs Today's date: 05/20/2019 Total lbs lost to date: 0 Total lbs lost since last in-office visit: 0  Interim History: Travis Owens hasn't started the meal plan yet. He is hoping to start the plan in the next week or two. He is talking about the plan with his wife. Patient brought in a nutritional shake he is drinking; young whey protein 120 calories with 22 grams of protein, vitamin Fruit of the Spirit 1 ounce (20 calories) and 1/2 ounce total omega nutrition 60 calories in the morning and he is often ordering out for lunch.  Subjective:   Type 2 diabetes mellitus with hyperglycemia, without long-term current use of insulin ([name]) Travis Owens is on Jardiance, Metformin and Victoza. His last [name] was 9.5 (04/30/19). Travis Owens is working up to 1.8 mg of Victoza. His last eye exam was in the Fall of 2020. Travis Owens denies hypoglycemia.  Lab Results  Component Value Date   HGBA1C 9.5 (H) 04/30/2019   HGBA1C 8.3 (A) 12/17/2018   HGBA1C 9.5 (H) 09/12/2018   Lab Results  Component Value Date   LDLCALC 75 04/30/2019   CREATININE 0.62 (L) 04/30/2019   Lab Results  Component Value Date   INSULIN 24.2 04/30/2019   At risk for osteoporosis Travis Owens is at higher risk of osteopenia and osteoporosis due to Vitamin D deficiency.   Vitamin D deficiency - Plan: Vitamin D, Ergocalciferol, (DRISDOL) 1.25 MG (50000 UNIT) CAPS capsule Travis Owens's Vitamin D level was 25.8 on 04/30/19. He is not on vitamin D supplement. He denies nausea, vomiting or muscle weakness.  Assessment/Plan:   Type 2 diabetes mellitus with  hyperglycemia, without long-term current use of insulin ([name]) Good blood sugar control is important to decrease the likelihood of diabetic complications such as nephropathy, neuropathy, limb loss, blindness, coronary artery disease, and death. Intensive lifestyle modification including diet, exercise and weight loss are the first line of treatment for diabetes. Deniz will check his blood sugar at least 3 to 4 times per week in the morning (fasting). The goal is to decrease Jardiance as his blood sugars decrease.  At risk for osteoporosis Adarsh was given approximately 15 minutes of coronary artery disease prevention counseling today. He is 64 y.o. male and has risk factors for obstructive sleep apnea including obesity. We discussed intensive lifestyle modifications today with an emphasis on specific weight loss instructions and strategies.  Repetitive spaced learning was employed today to elicit superior memory formation and behavioral change.  Vitamin D deficiency - Plan: Vitamin D, Ergocalciferol, (DRISDOL) 1.25 MG (50000 UNIT) CAPS capsule Low Vitamin D level contributes to fatigue and are associated with obesity, breast, and colon cancer. He agrees to continue to take prescription Vitamin D @50 ,000 IU every week and will follow-up for routine testing of Vitamin D, at least 2-3 times per year to avoid over-replacement.  Class 1 obesity with serious comorbidity and body mass index (BMI) of 33.0 to 33.9 in adult, unspecified obesity type Travis Owens is currently in the action stage of change. As such, his goal is to continue with weight loss efforts. He  has agreed to the Category 4 Plan.   Exercise goals: No exercise has been prescribed at this time.  Behavioral modification strategies: increasing lean protein intake, increasing vegetables, meal planning and cooking strategies, keeping healthy foods in the home and planning for success. Travis Owens can substitute eggs in the morning with a shake. Patient is to  decrease ice cream at night and chang to less than 100 calorie ice cream.  Travis Owens has agreed to follow-up with our clinic in 2 weeks. He was informed of the importance of frequent follow-up visits to maximize his success with intensive lifestyle modifications for his multiple health conditions.   Objective:   Blood pressure 112/68, pulse 84, temperature 98.3 F (36.8 C), temperature source Oral, height 5\' 8"  (1.727 m), weight 220 lb (99.8 kg), SpO2 98 %. Body mass index is 33.45 kg/m.  General: Cooperative, alert, well developed, in no acute distress. HEENT: Conjunctivae and lids unremarkable. Cardiovascular: Regular rhythm.  Lungs: Normal work of breathing. Neurologic: No focal deficits.   Lab Results  Component Value Date   CREATININE 0.62 (L) 04/30/2019   BUN 13 04/30/2019   NA 138 04/30/2019   K 4.4 04/30/2019   CL 101 04/30/2019   CO2 20 04/30/2019   Lab Results  Component Value Date   ALT 30 04/30/2019   AST 26 04/30/2019   ALKPHOS 73 04/30/2019   BILITOT 0.3 04/30/2019   Lab Results  Component Value Date   HGBA1C 9.5 (H) 04/30/2019   HGBA1C 8.3 (A) 12/17/2018   HGBA1C 9.5 (H) 09/12/2018   HGBA1C 7.8 09/07/2017   HGBA1C 10.5 06/07/2017   Lab Results  Component Value Date   INSULIN 24.2 04/30/2019   Lab Results  Component Value Date   TSH 1.460 04/30/2019   Lab Results  Component Value Date   CHOL 163 04/30/2019   HDL 55 04/30/2019   LDLCALC 75 04/30/2019   TRIG 201 (H) 04/30/2019   CHOLHDL 2 09/12/2018   Lab Results  Component Value Date   WBC 8.3 04/30/2019   HGB 14.1 04/30/2019   HCT 42.1 04/30/2019   MCV 87 04/30/2019   PLT 273 04/30/2019   No results found for: IRON, TIBC, FERRITIN   Ref. Range 04/30/2019 11:45  Vitamin D, 25-Hydroxy Latest Ref Range: 30.0 - 100.0 ng/mL 25.8 (L)   Obesity Behavioral Intervention Documentation for Insurance:   Approximately 15 minutes were spent on the discussion below.  ASK: We discussed the diagnosis  of obesity with Travis Owens today and Travis Owens agreed to give [place] permission to discuss obesity behavioral modification therapy today.  ASSESS: Travis Owens has the diagnosis of obesity and his BMI today is 33.46. Travis Owens is in the action stage of change.   ADVISE: Anastasios was educated on the multiple health risks of obesity as well as the benefit of weight loss to improve his health. He was advised of the need for long term treatment and the importance of lifestyle modifications to improve his current health and to decrease his risk of future health problems.  AGREE: Multiple dietary modification options and treatment options were discussed and Kalon agreed to follow the recommendations documented in the above note.  ARRANGE: Brixton was educated on the importance of frequent visits to treat obesity as outlined per CMS and USPSTF guidelines and agreed to schedule his next follow up appointment today.  Attestation Statements:   Reviewed by clinician on day of visit: allergies, medications, problem list, medical history, surgical history, family history, social history, and previous encounter notes.  I, Doreene Nest, am acting as transcriptionist for Eber Jones, MD.  I have reviewed the above documentation for accuracy and completeness, and I agree with the above. Eber Jones, MD

## 2019-05-23 ENCOUNTER — Ambulatory Visit (HOSPITAL_BASED_OUTPATIENT_CLINIC_OR_DEPARTMENT_OTHER): Payer: No Typology Code available for payment source | Attending: Cardiology | Admitting: Cardiovascular Disease

## 2019-05-23 ENCOUNTER — Other Ambulatory Visit: Payer: Self-pay

## 2019-05-23 DIAGNOSIS — Z7984 Long term (current) use of oral hypoglycemic drugs: Secondary | ICD-10-CM | POA: Diagnosis not present

## 2019-05-23 DIAGNOSIS — I10 Essential (primary) hypertension: Secondary | ICD-10-CM | POA: Diagnosis not present

## 2019-05-23 DIAGNOSIS — G4733 Obstructive sleep apnea (adult) (pediatric): Secondary | ICD-10-CM | POA: Diagnosis not present

## 2019-05-23 DIAGNOSIS — Z79899 Other long term (current) drug therapy: Secondary | ICD-10-CM | POA: Insufficient documentation

## 2019-05-23 DIAGNOSIS — G4730 Sleep apnea, unspecified: Secondary | ICD-10-CM

## 2019-05-24 MED ORDER — VITAMIN D (ERGOCALCIFEROL) 1.25 MG (50000 UNIT) PO CAPS: 50000.0000 [IU] | ORAL_CAPSULE | ORAL | 0 refills | Status: DC

## 2019-05-24 MED FILL — VIT D2 1.25 MG (50,000 UNIT: 1.25 MG | 28 days supply | Qty: 4 | Fill #0

## 2019-05-28 ENCOUNTER — Telehealth: Payer: Self-pay | Admitting: *Deleted

## 2019-05-28 ENCOUNTER — Encounter (HOSPITAL_BASED_OUTPATIENT_CLINIC_OR_DEPARTMENT_OTHER): Payer: Self-pay | Admitting: Cardiovascular Disease

## 2019-05-28 NOTE — Procedures (Signed)
Patient Name: Travis Owens, Travis Owens Date: 05/23/2019 Gender: Male D.O.B: [birth year] Age (years): 63 Referring Provider: Oswaldo Milian Height (inches): 68 Interpreting Physician: Shelva Majestic MD, ABSM Weight (lbs): 220 RPSGT: Zadie Rhine BMI: 33 MRN: [id number] Neck Size: 18.00  CLINICAL INFORMATION The patient is referred for a split night study with BPAP.  MEDICATIONS aspirin EC 81 MG tablet  atorvastatin (LIPITOR) 40 MG tablet  Blood Glucose Monitoring Suppl (FREESTYLE LITE) DEVI  empagliflozin (JARDIANCE) 25 MG TABS tablet  escitalopram (LEXAPRO) 20 MG tablet  ezetimibe (ZETIA) 10 MG tablet  glucose blood (FREESTYLE LITE) test strip  Lancets (FREESTYLE) lancets  liraglutide (VICTOZA) 18 MG/3ML SOPN  losartan (COZAAR) 100 MG tablet  metFORMIN (GLUCOPHAGE) 1000 MG tablet  metoprolol succinate (TOPROL XL) 25 MG 24 hr tablet  metoprolol tartrate (LOPRESSOR) 100 MG tablet(Expired)  Multiple Vitamin (MULTIVITAMIN) tablet  pantoprazole (PROTONIX) 40 MG tablet  Vitamin D, Ergocalciferol, (DRISDOL) 1.25 MG (50000 UNIT) CAPS capsule  Medications self-administered by patient taken the night of the study : [place] As per the AASM Manual for the Scoring of Sleep and Associated Events v2.3 (April 2016) with a hypopnea requiring 4% desaturations.  The channels recorded and monitored were frontal, central and occipital EEG, electrooculogram (EOG), submentalis EMG (chin), nasal and oral airflow, thoracic and abdominal wall motion, anterior tibialis EMG, snore microphone, electrocardiogram, and pulse oximetry. Bi-level positive airway pressure (BiPAP) was initiated when the patient met split night criteria and was titrated according to treat sleep-disordered breathing.  RESPIRATORY PARAMETERS Diagnostic Total AHI (/hr): 50.5 RDI (/hr): 76.1 OA Index (/hr): 16.1 CA Index (/hr): 0.0 REM AHI (/hr): N/A NREM AHI (/hr): 50.5 Supine AHI (/hr): 50.5 Non-supine  AHI (/hr): N/A Min O2 Sat (%): 76.0 Mean O2 (%): 91.9 Time below 88% (min): 15.3   Titration Optimal IPAP Pressure (cm): 18 Optimal EPAP Pressure (cm): 14 AHI at Optimal Pressure (/hr): 0.0 Min O2 at Optimal Pressure (%): 93.0 Sleep % at Optimal (%): 8 Supine % at Optimal (%): 100    SLEEP ARCHITECTURE The study was initiated at 10:41:27 PM and terminated at 4:41:50 AM. The total recorded time was 360.4 minutes. EEG confirmed total sleep time was 257.5 minutes yielding a sleep efficiency of 71.5%%. Sleep onset after lights out was 24.9 minutes with a REM latency of 202.5 minutes. The patient spent 14.6%% of the night in stage N1 sleep, 74.6%% in stage N2 sleep, 2.7%% in stage N3 and 8.2% in REM. Wake after sleep onset (WASO) was 78.0 minutes. The Arousal Index was 62.2/hour.   LEG MOVEMENT DATA The total Periodic Limb Movements of Sleep (PLMS) were 0. The PLMS index was 0.0 .  CARDIAC DATA The 2 lead EKG demonstrated sinus rhythm. The mean heart rate was 100.0 beats per minute. Other EKG findings include: None.  IMPRESSIONS - Severe obstructive sleep apnea occurred during the diagnostic portion of the study (AHI  50.5 /h; RDI 76.1/h, with absence of REM sleep). CPAP was initiated at 6 cm,  titrated to 7 cm and was then transitioned to BiPAP therapy due to central events.  BiPAP was initated at 9/5 with maximum titration 18/14 cm of water. AHI at 18/14 was 0, but RDI 86.1 without any REM sleep. - No central sleep apnea occurred during the diagnostic portion of the study (CAI = 0.0/hour). - Severe oxygen desaturation during the diagnostic portion of the study to a nadir of 76.0%. - No snoring was audible during this study. - No cardiac abnormalities  were noted during this study. - Clinically significant periodic limb movements of sleep did not occur during the study.  DIAGNOSIS - Obstructive Sleep Apnea (327.23 [G47.33 ICD-10])  RECOMMENDATIONS - Recommend an initial trial of BiPAP Auto  therapy with an EPAP min of 13, PS of 4 and IPAP max of 25 cm H2O with heated humidification.  A Medium size Resmed Full Face Mask AirFit F20 mask was used for the titration.  - Effort should be made to optimize nasal and oropharyngeal patency.  - Avoid alcohol, sedatives and other CNS depressants that may worsen sleep apnea and disrupt normal sleep architecture. - Sleep hygiene should be reviewed to assess factors that may improve sleep quality. - Weight management and regular exercise should be initiated or continued. - Recommend a download in 30 days and sleep Center for evaluation after 4 weeks of therapy.  [Electronically signed] 05/28/2019 09:14 AM  Shelva Majestic MD, [facility], [place], American Board of Sleep Medicine   NPI: [id number] [place] PH: [phone]   FX: [phone] [place]

## 2019-05-28 NOTE — Telephone Encounter (Signed)
Patient notified of sleep study results and recommendations.  BIPAP order sent to [facility].

## 2019-06-04 ENCOUNTER — Other Ambulatory Visit: Payer: Self-pay

## 2019-06-04 ENCOUNTER — Ambulatory Visit (INDEPENDENT_AMBULATORY_CARE_PROVIDER_SITE_OTHER): Payer: No Typology Code available for payment source | Admitting: Family Medicine

## 2019-06-04 ENCOUNTER — Encounter (INDEPENDENT_AMBULATORY_CARE_PROVIDER_SITE_OTHER): Payer: Self-pay | Admitting: Family Medicine

## 2019-06-04 VITALS — BP 125/76 | HR 85 | Temp 98.1°F | Ht 68.0 in | Wt 222.0 lb

## 2019-06-04 DIAGNOSIS — Z6833 Body mass index (BMI) 33.0-33.9, adult: Secondary | ICD-10-CM

## 2019-06-04 DIAGNOSIS — E669 Obesity, unspecified: Secondary | ICD-10-CM | POA: Diagnosis not present

## 2019-06-04 DIAGNOSIS — E1165 Type 2 diabetes mellitus with hyperglycemia: Secondary | ICD-10-CM | POA: Diagnosis not present

## 2019-06-04 DIAGNOSIS — E559 Vitamin D deficiency, unspecified: Secondary | ICD-10-CM | POA: Diagnosis not present

## 2019-06-04 MED FILL — VIT D2 1.25 MG (50,000 UNIT: 1.25 MG | 28 days supply | Qty: 4 | Fill #0

## 2019-06-04 NOTE — Progress Notes (Signed)
Chief Complaint:   OBESITY Travis Owens is here to discuss his progress with his obesity treatment plan along with follow-up of his obesity related diagnoses. Travis Owens is on the Category 4 Plan and states he is following his eating plan approximately 0% of the time. Travis Owens states he is exercising 0 minutes 0 times per week.  Today's visit was #: 3 Starting weight: 220 lbs Starting date: 04/30/2019 Today's weight: 222 lbs Today's date: 06/04/2019 Total lbs lost to date: 0 Total lbs lost since last in-office visit: 0  Interim History: Travis Owens has not gotten on the eating plan yet, but his wife is going to the grocery store Thursday, so he will start the plan then. He has a cardiac CT scheduled Thursday (2 days). He did have some indulgent food at [employer].  Subjective:   Type 2 diabetes mellitus with hyperglycemia, without long-term current use of insulin ([name]) Travis Owens is on Jardiance, Metformin and Victoza. He is doing 1.2 mg of Victoza. Travis Owens has no GI side effects of Metformin or Victoza. He is not checking his blood sugars at home.  Lab Results  Component Value Date   HGBA1C 9.5 (H) 04/30/2019   HGBA1C 8.3 (A) 12/17/2018   HGBA1C 9.5 (H) 09/12/2018   Lab Results  Component Value Date   LDLCALC 75 04/30/2019   CREATININE 0.62 (L) 04/30/2019   Lab Results  Component Value Date   INSULIN 24.2 04/30/2019   Vitamin D deficiency Travis Owens's Vitamin D level was 25.8 on 04/30/19. He hasn't picked up the vit D yet. He is not on vit D supplementation. Travis Owens admits fatigue.    Assessment/Plan:   Type 2 diabetes mellitus with hyperglycemia, without long-term current use of insulin ([name]) Good blood sugar control is important to decrease the likelihood of diabetic complications such as nephropathy, neuropathy, limb loss, blindness, coronary artery disease, and death. Intensive lifestyle modification including diet, exercise and weight loss are the first line of treatment for diabetes. Travis Owens is to  start checking his blood sugars at least 3 to 4 times per week.  Vitamin D deficiency Low Vitamin D level contributes to fatigue and are associated with obesity, breast, and colon cancer. He will follow-up for routine testing of Vitamin D, at least 2-3 times per year to avoid over-replacement. We will follow up at the next appointment.  Class 1 obesity with serious comorbidity and body mass index (BMI) of 33.0 to 33.9 in adult, unspecified obesity type Travis Owens is currently in the action stage of change. As such, his goal is to continue with weight loss efforts. He has agreed to the Category 4 Plan.   Exercise goals: No exercise has been prescribed at this time.  Behavioral modification strategies: increasing lean protein intake, increasing vegetables, meal planning and cooking strategies and keeping healthy foods in the home.  Travis Owens has agreed to follow-up with our clinic in 2 weeks. He was informed of the importance of frequent follow-up visits to maximize his success with intensive lifestyle modifications for his multiple health conditions.   Objective:   Blood pressure 125/76, pulse 85, temperature 98.1 F (36.7 C), temperature source Oral, height 5\' 8"  (1.727 m), weight 222 lb (100.7 kg), SpO2 97 %. Body mass index is 33.75 kg/m.  General: Cooperative, alert, well developed, in no acute distress. HEENT: Conjunctivae and lids unremarkable. Cardiovascular: Regular rhythm.  Lungs: Normal work of breathing. Neurologic: No focal deficits.   Lab Results  Component Value Date   CREATININE 0.62 (L)  04/30/2019   BUN 13 04/30/2019   NA 138 04/30/2019   K 4.4 04/30/2019   CL 101 04/30/2019   CO2 20 04/30/2019   Lab Results  Component Value Date   ALT 30 04/30/2019   AST 26 04/30/2019   ALKPHOS 73 04/30/2019   BILITOT 0.3 04/30/2019   Lab Results  Component Value Date   HGBA1C 9.5 (H) 04/30/2019   HGBA1C 8.3 (A) 12/17/2018   HGBA1C 9.5 (H) 09/12/2018   HGBA1C 7.8 09/07/2017    HGBA1C 10.5 06/07/2017   Lab Results  Component Value Date   INSULIN 24.2 04/30/2019   Lab Results  Component Value Date   TSH 1.460 04/30/2019   Lab Results  Component Value Date   CHOL 163 04/30/2019   HDL 55 04/30/2019   LDLCALC 75 04/30/2019   TRIG 201 (H) 04/30/2019   CHOLHDL 2 09/12/2018   Lab Results  Component Value Date   WBC 8.3 04/30/2019   HGB 14.1 04/30/2019   HCT 42.1 04/30/2019   MCV 87 04/30/2019   PLT 273 04/30/2019   No results found for: IRON, TIBC, FERRITIN   Ref. Range 04/30/2019 11:45  Vitamin D, 25-Hydroxy Latest Ref Range: 30.0 - 100.0 ng/mL 25.8 (L)    Attestation Statements:   Reviewed by clinician on day of visit: allergies, medications, problem list, medical history, surgical history, family history, social history, and previous encounter notes.  Time spent on visit including pre-visit chart review and post-visit care was 13 minutes.   I, Doreene Nest, am acting as Location manager for Eber Jones, Md.  I have reviewed the above documentation for accuracy and completeness, and I agree with the above. - Ilene Qua, MD

## 2019-06-05 ENCOUNTER — Telehealth (HOSPITAL_COMMUNITY): Payer: Self-pay | Admitting: Emergency Medicine

## 2019-06-05 ENCOUNTER — Encounter (HOSPITAL_COMMUNITY): Payer: Self-pay

## 2019-06-05 NOTE — Telephone Encounter (Signed)
Left message on voicemail with name and callback number Lamyra Malcolm RN Navigator Cardiac Imaging [facility] [phone] Office [phone] Cell  

## 2019-06-06 ENCOUNTER — Other Ambulatory Visit: Payer: Self-pay

## 2019-06-06 ENCOUNTER — Ambulatory Visit (HOSPITAL_COMMUNITY)
Admission: RE | Admit: 2019-06-06 | Discharge: 2019-06-06 | Disposition: A | Discharge status: Home / Self Care | Payer: No Typology Code available for payment source | Source: Ambulatory Visit | Attending: Cardiology | Admitting: Cardiology

## 2019-06-06 DIAGNOSIS — R0782 Intercostal pain: Secondary | ICD-10-CM | POA: Insufficient documentation

## 2019-06-06 MED ORDER — NITROGLYCERIN 0.4 MG SL SUBL
SUBLINGUAL_TABLET | SUBLINGUAL | Status: AC
  Administered 2019-06-06: 0.8 mg via SUBLINGUAL
  Filled 2019-06-06: qty 2

## 2019-06-06 MED ORDER — IOHEXOL 350 MG/ML SOLN
80.0000 mL | Freq: Once | INTRAVENOUS | Status: AC | PRN
  Administered 2019-06-06: 15:00:00 80 mL via INTRAVENOUS

## 2019-06-06 MED ORDER — NITROGLYCERIN 0.4 MG SL SUBL: 0.8000 mg | SUBLINGUAL_TABLET | Freq: Once | SUBLINGUAL | Status: AC

## 2019-06-07 ENCOUNTER — Other Ambulatory Visit: Payer: Self-pay | Admitting: *Deleted

## 2019-06-07 MED ORDER — ATORVASTATIN CALCIUM 80 MG PO TABS: 80.0000 mg | ORAL_TABLET | Freq: Every day | ORAL | 3 refills | Status: DC

## 2019-06-09 NOTE — Progress Notes (Signed)
Cardiology Office Note:    Date:  06/10/2019   ID:  Travis Owens, DOB 03/07/1956, MRN [id number]  PCP:  Travis Barrack, MD  Cardiologist:  No primary care provider on file.  Electrophysiologist:  None   Referring MD: Travis Barrack, MD   No chief complaint on file.   History of Present Illness:    Travis Owens is a 64 y.o. male with a hx of type 2 diabetes, hypertension, hyperlipidemia, OSA who present for follow-up.  He was is referred by Dr. Leafy Owens for evaluation of left bundle branch block on 05/01/18.  He was referred to Dr. Leafy Owens for weight loss evaluation, an EKG was done which showed left bundle branch block.  No baseline EKG on file.  He does report that he gets left-sided chest pain, occurs about once or twice per year.  States that he feels pressure over left side of chest, 3 out of 10 in intensity.  States that it occurs when he is under significant stress.  He has not noted chest pain with activity.  Reports most exertion he does is going for walks, will walk for 30 minutes or so, about 1.5 miles.  He denies any palpitations or syncope.  Does report that he gets lightheaded sometimes with standing.  Reports BP has been well controlled on losartan.  Diabetes has been poorly controlled, last [name] 9.5.  No smoking history.  No heart disease in immediate family.  TTE was done on 05/03/2019 and showed EF 40 to 45%, with marked marked septal-lateral dyssynchrony consistent with LBBB.  Coronary CTA on 06/06/2019 showed nonobstructive CAD (calcified plaque in the RPDA causing mild (25-49%) stenosis and calcified plaque in the proximal LAD and proximal RCA causing minimal (0-24%) stenosis).  Calcium score 111 (62nd percentile for age/gender).  Reports rare chest pain.  Denies any shortness or syncope.  Felt dizzy a few weeks ago after stood up too fast, but other than that denies any lightheadedness.  Sleep study was positive, he is being started on BiPAP.   Past Medical History:    Diagnosis Date  . Anxiety   . Chest pain   . Depression   . Diabetes mellitus without complication ([place])   . Fatty liver   . GERD (gastroesophageal reflux disease)   . Hyperlipidemia   . Hypertension   . Sleep apnea    has CPAP/does not use    Past Surgical History:  Procedure Laterality Date  . COLONOSCOPY     Dr. Olevia Perches  2008    Current Medications: Current Meds  Medication Sig  . aspirin EC 81 MG tablet Take 81 mg by mouth daily.  Marland Kitchen atorvastatin (LIPITOR) 80 MG tablet Take 1 tablet (80 mg total) by mouth daily at 6 PM.  . Blood Glucose Monitoring Suppl (FREESTYLE LITE) DEVI Check Blood sugar twice daily  . empagliflozin (JARDIANCE) 25 MG TABS tablet Take 25 mg by mouth daily.  Marland Kitchen escitalopram (LEXAPRO) 20 MG tablet Take 1 tablet (20 mg total) by mouth daily.  Marland Kitchen ezetimibe (ZETIA) 10 MG tablet Take 1 tablet (10 mg total) by mouth daily.  Marland Kitchen glucose blood (FREESTYLE LITE) test strip Check Blood sugar twice daily  . Lancets (FREESTYLE) lancets Check blood sugar twice daily  . liraglutide (VICTOZA) 18 MG/3ML SOPN Inject 1.2 mg into the skin daily.   Marland Kitchen losartan (COZAAR) 100 MG tablet Take 1 tablet (100 mg total) by mouth daily.  . metFORMIN (GLUCOPHAGE) 1000 MG tablet Take 1 tablet (1,000  mg total) by mouth 2 (two) times daily.  . metoprolol succinate (TOPROL XL) 50 MG 24 hr tablet Take 1 tablet (50 mg total) by mouth daily.  . Multiple Vitamin (MULTIVITAMIN) tablet Take 1 tablet by mouth daily.  . pantoprazole (PROTONIX) 40 MG tablet Take 1 tablet (40 mg total) by mouth daily.  . Vitamin D, Ergocalciferol, (DRISDOL) 1.25 MG (50000 UNIT) CAPS capsule Take 1 capsule (50,000 Units total) by mouth every 7 (seven) days.  . [DISCONTINUED] metoprolol succinate (TOPROL XL) 25 MG 24 hr tablet Take 1 tablet (25 mg total) by mouth daily.     Allergies:   Patient has no known allergies.   Social History   Socioeconomic History  . Marital status: Married    Spouse name: [name]   . Number of children: Not on file  . Years of education: Not on file  . Highest education level: Not on file  Occupational History  . Occupation: Theme park manager    Comment: [employer]  Tobacco Use  . Smoking status: Never Smoker  . Smokeless tobacco: Never Used  Substance and Sexual Activity  . Alcohol use: No  . Drug use: No  . Sexual activity: Yes  Other Topics Concern  . Not on file  Social History Narrative  . Not on file   Social Determinants of Health   Financial Resource Strain:   . Difficulty of Paying Living Expenses: Not on file  Food Insecurity:   . Worried About Charity fundraiser in the Last Year: Not on file  . Ran Out of Food in the Last Year: Not on file  Transportation Needs:   . Lack of Transportation (Medical): Not on file  . Lack of Transportation (Non-Medical): Not on file  Physical Activity:   . Days of Exercise per Week: Not on file  . Minutes of Exercise per Session: Not on file  Stress:   . Feeling of Stress : Not on file  Social Connections:   . Frequency of Communication with Friends and Family: Not on file  . Frequency of Social Gatherings with Friends and Family: Not on file  . Attends Religious Services: Not on file  . Active Member of Clubs or Organizations: Not on file  . Attends Archivist Meetings: Not on file  . Marital Status: Not on file     Family History: The patient's family history includes Cancer in his father; Hypertension in his paternal grandmother; Stroke in his father and paternal grandfather. There is no history of Colon cancer, Colon polyps, Esophageal cancer, Rectal cancer, or Stomach cancer.  ROS:   Please see the history of present illness.     All other systems reviewed and are negative.  EKGs/Labs/Other Studies Reviewed:    The following studies were reviewed today:   EKG:  EKG is  Not ordered today.  The last ekg demonstrates normal sinus rhythm, rate 82, left bundle branch block  TTE  05/03/19:  1. Left ventricular ejection fraction, by visual estimation, is 40 to 45%. The left ventricle has mild to moderately decreased function. There is mildly increased left ventricular hypertrophy. There is marked septal-lateral dyssynchrony consistent with  LBBB.  2. Left ventricular diastolic parameters are consistent with Grade I diastolic dysfunction (impaired relaxation).  3. Global right ventricle has normal systolic function.The right ventricular size is normal. No increase in right ventricular wall thickness.  4. Left atrial size was normal.  5. Right atrial size was normal.  6. The  mitral valve is normal in structure. No evidence of mitral valve regurgitation. No evidence of mitral stenosis.  7. The tricuspid valve is normal in structure.  8. The aortic valve is tricuspid. Aortic valve regurgitation is not visualized. Mild aortic valve sclerosis without stenosis  9. TR signal is inadequate for assessing pulmonary artery systolic pressure. 10. The inferior vena cava is normal in size with greater than 50% respiratory variability, suggesting right atrial pressure of 3 mmHg.  Coronry CTA 06/06/19: 1. Coronary calcium score of 111. This was 37 percentile for age and sex matched control.  2. Normal coronary origin with right dominance.  3. Nonobstructive CAD. There is calcified plaque in the RPDA causing mild (25-49%) stenosis and calcified plaque in the proximal LAD and proximal RCA causing minimal (0-24%) stenosis  CAD-RADS 2. Mild non-obstructive CAD (25-49%). Consider non-atherosclerotic causes of chest pain. Consider preventive therapy and risk factor modification.1. Coronary calcium score of 111. This was 23 percentile for age and sex matched control.  2. Normal coronary origin with right dominance.  3. Nonobstructive CAD. There is calcified plaque in the RPDA causing mild (25-49%) stenosis and calcified plaque in the proximal LAD and proximal RCA causing minimal  (0-24%) stenosis  CAD-RADS 2. Mild non-obstructive CAD (25-49%). Consider non-atherosclerotic causes of chest pain. Consider preventive therapy and risk factor modification.  Noncardiac: IMPRESSION: No significant noncardiac findings. Incidental 6 mm densely calcified granuloma in the lingula.   Recent Labs: 04/30/2019: ALT 30; BUN 13; Creatinine, Ser 0.62; Hemoglobin 14.1; Platelets 273; Potassium 4.4; Sodium 138; TSH 1.460  Recent Lipid Panel    Component Value Date/Time   CHOL 163 04/30/2019 1145   TRIG 201 (H) 04/30/2019 1145   HDL 55 04/30/2019 1145   CHOLHDL 2 09/12/2018 1100   VLDL 35.8 09/12/2018 1100   LDLCALC 75 04/30/2019 1145    Physical Exam:    VS:  BP 130/77   Pulse 73   Ht 5\' 8"  (1.727 m)   Wt 227 lb 9.6 oz (103.2 kg)   SpO2 98%   BMI 34.61 kg/m     Wt Readings from Last 3 Encounters:  06/10/19 227 lb 9.6 oz (103.2 kg)  06/04/19 222 lb (100.7 kg)  05/23/19 220 lb (99.8 kg)     GEN:   in no acute distress HEENT: Normal NECK: No JVD LYMPHATICS: No lymphadenopathy CARDIAC:RRR, no murmurs, rubs, gallops RESPIRATORY:  Clear to auscultation without rales, wheezing or rhonchi  ABDOMEN: Soft, non-tender, non-distended MUSCULOSKELETAL:  No edema; No deformity  SKIN: Warm and dry NEUROLOGIC:  Alert and oriented x 3 PSYCHIATRIC:  Normal affect   ASSESSMENT:    1. Nonischemic cardiomyopathy ([place])   2. Systolic dysfunction   3. LBBB (left bundle branch block)   4. Coronary artery disease involving native coronary artery of native heart without angina pectoris   5. Essential hypertension   6. Hyperlipidemia, unspecified hyperlipidemia type   7. OSA (obstructive sleep apnea)    PLAN:     Systolic dysfunction: EF 40 to 45% on TTE from 05/02/18, with marked marked septal-lateral dyssynchrony consistent with LBBB.  Coronary CTA showed nonobstructive CAD. -Continue losartan 100 mg daily -Increase Toprol-XL to 50 mg daily -Cardiac MRI to evaluate  etiology of nonischemic cardiomyopathy  Coronary artery disease: coronary CTA on 06/06/2019 showed nonobstructive CAD (calcified plaque in the RPDA causing mild (25-49%) stenosis and calcified plaque in the proximal LAD and proximal RCA causing minimal (0-24%) stenosis).  Calcium score 111 (62nd percentile for age/gender). -Atorvastatin increased  to 80 mg daily  Chest pain: Atypical in description, as describes left-sided pressure that occurs with stress.  Has not noted relationship with exertion.  Considering systolic dysfunction on TTE, coronary CTA was done to evaluate for CAD.  Coronary CTA showed nonobstructive CAD  Hypertension: On losartan 100 mg daily, will increase Toprol-XL to 50 mg daily as above  Hyperlipidemia: Atorvastatin 40 mg daily and Zetia 10 mg daily.  Last LDL 75 on 04/30/19.  Atorvastatin increased to 80 mg daily for goal LDL less than 70  Type 2 diabetes: [name] 9.5.   On metformin, Victoza, and Jardiance   OSA: diagnosed years ago, does not use CPAP. Sleep study on 05/15/2019 confirmed OSA, he is being started on BiPAP  RTC in 2 months  Medication Adjustments/Labs and Tests Ordered: Current medicines are reviewed at length with the patient today.  Concerns regarding medicines are outlined above.  Orders Placed This Encounter  Procedures  . MR Card Morphology Wo/W Cm   Meds ordered this encounter  Medications  . metoprolol succinate (TOPROL XL) 50 MG 24 hr tablet    Sig: Take 1 tablet (50 mg total) by mouth daily.    Dispense:  90 tablet    Refill:  3    Dose increase    Patient Instructions  Medication Instructions:  INCREASE metoprolol succinate (Toprol XL) to 50 mg daily  *If you need a refill on your cardiac medications before your next appointment, please call your pharmacy*  Lab Work: NONE If you have labs (blood work) drawn today and your tests are completely normal, you will receive your results only by: Marland Kitchen MyChart Message (if you have MyChart) OR . A  paper copy in the mail If you have any lab test that is abnormal or we need to change your treatment, we will call you to review the results.  Testing/Procedures: Your physician has requested that you have a cardiac MRI. Cardiac MRI uses a computer to create images of your heart as its beating, producing both still and moving pictures of your heart and major blood vessels. For further information please visit [email or web address] Please follow the instruction sheet given to you today for more information.  --this must be approved by insurance prior to scheduling.  We will call to schedule once approved.   Follow-Up: At [facility], you and your health needs are our priority.  As part of our continuing mission to provide you with exceptional heart care, we have created designated Provider Care Teams.  These Care Teams include your primary Cardiologist (physician) and Advanced Practice Providers (APPs -  Physician Assistants and Nurse Practitioners) who all work together to provide you with the care you need, when you need it.  Your next appointment:   2 month(s)  The format for your next appointment:   In Person  Provider:   Oswaldo Milian, MD        Signed, Donato Heinz, MD  06/10/2019 1:04 PM    [place]

## 2019-06-10 ENCOUNTER — Ambulatory Visit (INDEPENDENT_AMBULATORY_CARE_PROVIDER_SITE_OTHER): Payer: No Typology Code available for payment source | Admitting: Family Medicine

## 2019-06-10 ENCOUNTER — Telehealth: Payer: Self-pay | Admitting: Cardiology

## 2019-06-10 ENCOUNTER — Other Ambulatory Visit: Payer: Self-pay

## 2019-06-10 ENCOUNTER — Ambulatory Visit (INDEPENDENT_AMBULATORY_CARE_PROVIDER_SITE_OTHER): Payer: No Typology Code available for payment source | Admitting: Cardiology

## 2019-06-10 ENCOUNTER — Encounter: Payer: Self-pay | Admitting: Cardiology

## 2019-06-10 VITALS — BP 130/77 | HR 73 | Ht 68.0 in | Wt 227.6 lb

## 2019-06-10 DIAGNOSIS — I519 Heart disease, unspecified: Secondary | ICD-10-CM

## 2019-06-10 DIAGNOSIS — I2510 Atherosclerotic heart disease of native coronary artery without angina pectoris: Secondary | ICD-10-CM | POA: Diagnosis not present

## 2019-06-10 DIAGNOSIS — I447 Left bundle-branch block, unspecified: Secondary | ICD-10-CM | POA: Diagnosis not present

## 2019-06-10 DIAGNOSIS — I428 Other cardiomyopathies: Secondary | ICD-10-CM

## 2019-06-10 DIAGNOSIS — E785 Hyperlipidemia, unspecified: Secondary | ICD-10-CM

## 2019-06-10 DIAGNOSIS — I10 Essential (primary) hypertension: Secondary | ICD-10-CM

## 2019-06-10 DIAGNOSIS — G4733 Obstructive sleep apnea (adult) (pediatric): Secondary | ICD-10-CM

## 2019-06-10 MED ORDER — METOPROLOL SUCCINATE ER 50 MG PO TB24: 50.0000 mg | ORAL_TABLET | Freq: Every day | ORAL | 3 refills | Status: DC

## 2019-06-10 MED FILL — ATORVASTATIN 80 MG TABLET: 80 | 90 days supply | Qty: 90 | Fill #0 | Status: TO

## 2019-06-10 MED FILL — ATORVASTATIN 80 MG TABLET: 80 | 90 days supply | Qty: 90 | Fill #0

## 2019-06-10 MED FILL — METOPROLOL SUCCINATE ER 50: 50 | 90 days supply | Qty: 90 | Fill #0

## 2019-06-10 NOTE — Telephone Encounter (Signed)
Left message for patient to call and schedule 2 month f/u visit with Dr. Gardiner Rhyme

## 2019-06-10 NOTE — Patient Instructions (Signed)
Medication Instructions:  INCREASE metoprolol succinate (Toprol XL) to 50 mg daily  *If you need a refill on your cardiac medications before your next appointment, please call your pharmacy*  Lab Work: NONE If you have labs (blood work) drawn today and your tests are completely normal, you will receive your results only by: Marland Kitchen MyChart Message (if you have MyChart) OR . A paper copy in the mail If you have any lab test that is abnormal or we need to change your treatment, we will call you to review the results.  Testing/Procedures: Your physician has requested that you have a cardiac MRI. Cardiac MRI uses a computer to create images of your heart as its beating, producing both still and moving pictures of your heart and major blood vessels. For further information please visit [email or web address] Please follow the instruction sheet given to you today for more information.  --this must be approved by insurance prior to scheduling.  We will call to schedule once approved.   Follow-Up: At [facility], you and your health needs are our priority.  As part of our continuing mission to provide you with exceptional heart care, we have created designated Provider Care Teams.  These Care Teams include your primary Cardiologist (physician) and Advanced Practice Providers (APPs -  Physician Assistants and Nurse Practitioners) who all work together to provide you with the care you need, when you need it.  Your next appointment:   2 month(s)  The format for your next appointment:   In Person  Provider:   Oswaldo Milian, MD

## 2019-06-18 ENCOUNTER — Other Ambulatory Visit: Payer: Self-pay

## 2019-06-18 ENCOUNTER — Ambulatory Visit (INDEPENDENT_AMBULATORY_CARE_PROVIDER_SITE_OTHER): Payer: No Typology Code available for payment source | Admitting: Family Medicine

## 2019-06-18 ENCOUNTER — Encounter (INDEPENDENT_AMBULATORY_CARE_PROVIDER_SITE_OTHER): Payer: Self-pay | Admitting: Family Medicine

## 2019-06-18 VITALS — BP 117/70 | HR 79 | Temp 98.2°F | Ht 68.0 in | Wt 220.0 lb

## 2019-06-18 DIAGNOSIS — E1165 Type 2 diabetes mellitus with hyperglycemia: Secondary | ICD-10-CM

## 2019-06-18 DIAGNOSIS — E559 Vitamin D deficiency, unspecified: Secondary | ICD-10-CM

## 2019-06-18 DIAGNOSIS — E669 Obesity, unspecified: Secondary | ICD-10-CM

## 2019-06-18 DIAGNOSIS — Z6833 Body mass index (BMI) 33.0-33.9, adult: Secondary | ICD-10-CM | POA: Diagnosis not present

## 2019-06-18 NOTE — Progress Notes (Signed)
Chief Complaint:   OBESITY Travis Owens is here to discuss his progress with his obesity treatment plan along with follow-up of his obesity related diagnoses. Travis Owens is on the Category 4 Plan and states he is following his eating plan approximately 0% of the time (hasn't started yet, due to heart work-up). [name] states he is walking 1 mile, for 20 minutes, 7 times per week.  Today's visit was #: 4 Starting weight: 220 lbs Starting date: 04/30/2019 Today's weight: 220 lbs Today's date: 06/18/2019 Total lbs lost to date: 0 Total lbs lost since last in-office visit: 2  Interim History: Travis Owens hasn't started the plan yet, secondary to cardiac work-up for left bundle branch block. He is planning on going grocery shopping this week. [name] is doing a shake daily, and occasionally he is doing cereal. Lunch is Manwich or chili. He did eat out on Sunday at [employer].  Subjective:   Type 2 diabetes mellitus with hyperglycemia, without long-term current use of insulin ([name]) Nazar's fasting blood sugars were 200 two days ago and is 194 today. Patient voices he has noticed he eats more snacks in the late evening/night. He is on Jardiance, Metformin and Victoza.  Lab Results  Component Value Date   HGBA1C 9.5 (H) 04/30/2019   HGBA1C 8.3 (A) 12/17/2018   HGBA1C 9.5 (H) 09/12/2018   Lab Results  Component Value Date   LDLCALC 75 04/30/2019   CREATININE 0.62 (L) 04/30/2019   Lab Results  Component Value Date   INSULIN 24.2 04/30/2019   Vitamin D deficiency Travis Owens's Vitamin D level was 25.8 on 04/30/19. He is currently taking vit D. He admits fatigue and denies nausea, vomiting or muscle weakness.  Assessment/Plan:   Type 2 diabetes mellitus with hyperglycemia, without long-term current use of insulin ([name]) Good blood sugar control is important to decrease the likelihood of diabetic complications such as nephropathy, neuropathy, limb loss, blindness, coronary artery disease, and death.  Intensive lifestyle modification including diet, exercise and weight loss are the first line of treatment for diabetes. We will follow up blood sugar at the next appointment when patient has started the meal plan. There is no change in medications.  Vitamin D deficiency Low Vitamin D level contributes to fatigue and are associated with obesity, breast, and colon cancer. Harshil will continue vitamin D replacement (no refill needed) and he will follow-up for routine testing of Vitamin D, at least 2-3 times per year to avoid over-replacement.  Class 1 obesity with serious comorbidity and body mass index (BMI) of 33.0 to 33.9 in adult, unspecified obesity type Travis Owens is currently in the action stage of change. As such, his goal is to continue with weight loss efforts. He has agreed to the Category 4 Plan.   Exercise goals: Travis Owens will continue his current exercise regimen.  Behavioral modification strategies: increasing lean protein intake, increasing vegetables, meal planning and cooking strategies, keeping healthy foods in the home and planning for success.  Travis Owens has agreed to follow-up with our clinic in 2 weeks. He was informed of the importance of frequent follow-up visits to maximize his success with intensive lifestyle modifications for his multiple health conditions.   Objective:   Blood pressure 117/70, pulse 79, temperature 98.2 F (36.8 C), temperature source Oral, height 5\' 8"  (1.727 m), weight 220 lb (99.8 kg), SpO2 97 %. Body mass index is 33.45 kg/m.  General: Cooperative, alert, well developed, in no acute distress. HEENT: Conjunctivae and lids unremarkable. Cardiovascular: Regular rhythm.  Lungs: Normal work of breathing. Neurologic: No focal deficits.   Lab Results  Component Value Date   CREATININE 0.62 (L) 04/30/2019   BUN 13 04/30/2019   NA 138 04/30/2019   K 4.4 04/30/2019   CL 101 04/30/2019   CO2 20 04/30/2019   Lab Results  Component Value Date   ALT 30 04/30/2019    AST 26 04/30/2019   ALKPHOS 73 04/30/2019   BILITOT 0.3 04/30/2019   Lab Results  Component Value Date   HGBA1C 9.5 (H) 04/30/2019   HGBA1C 8.3 (A) 12/17/2018   HGBA1C 9.5 (H) 09/12/2018   HGBA1C 7.8 09/07/2017   HGBA1C 10.5 06/07/2017   Lab Results  Component Value Date   INSULIN 24.2 04/30/2019   Lab Results  Component Value Date   TSH 1.460 04/30/2019   Lab Results  Component Value Date   CHOL 163 04/30/2019   HDL 55 04/30/2019   LDLCALC 75 04/30/2019   TRIG 201 (H) 04/30/2019   CHOLHDL 2 09/12/2018   Lab Results  Component Value Date   WBC 8.3 04/30/2019   HGB 14.1 04/30/2019   HCT 42.1 04/30/2019   MCV 87 04/30/2019   PLT 273 04/30/2019   No results found for: IRON, TIBC, FERRITIN   Ref. Range 04/30/2019 11:45  Vitamin D, 25-Hydroxy Latest Ref Range: 30.0 - 100.0 ng/mL 25.8 (L)    Attestation Statements:   Reviewed by clinician on day of visit: allergies, medications, problem list, medical history, surgical history, family history, social history, and previous encounter notes.  Time spent on visit including pre-visit chart review and post-visit care was 14 minutes.   I, Doreene Nest, am acting as transcriptionist for Coralie Common, MD.  I have reviewed the above documentation for accuracy and completeness, and I agree with the above. - Ilene Qua, MD

## 2019-06-20 ENCOUNTER — Other Ambulatory Visit: Payer: Self-pay

## 2019-06-20 ENCOUNTER — Other Ambulatory Visit: Payer: Self-pay | Admitting: Cardiovascular Disease

## 2019-06-20 DIAGNOSIS — G4733 Obstructive sleep apnea (adult) (pediatric): Secondary | ICD-10-CM

## 2019-06-21 ENCOUNTER — Ambulatory Visit (INDEPENDENT_AMBULATORY_CARE_PROVIDER_SITE_OTHER): Payer: No Typology Code available for payment source | Admitting: Family Medicine

## 2019-06-21 ENCOUNTER — Encounter: Payer: Self-pay | Admitting: Family Medicine

## 2019-06-21 VITALS — BP 110/74 | HR 77 | Temp 97.4°F | Ht 68.0 in | Wt 223.4 lb

## 2019-06-21 DIAGNOSIS — E118 Type 2 diabetes mellitus with unspecified complications: Secondary | ICD-10-CM | POA: Diagnosis not present

## 2019-06-21 DIAGNOSIS — E1165 Type 2 diabetes mellitus with hyperglycemia: Secondary | ICD-10-CM | POA: Diagnosis not present

## 2019-06-21 DIAGNOSIS — I152 Hypertension secondary to endocrine disorders: Secondary | ICD-10-CM

## 2019-06-21 DIAGNOSIS — E1169 Type 2 diabetes mellitus with other specified complication: Secondary | ICD-10-CM | POA: Diagnosis not present

## 2019-06-21 DIAGNOSIS — E1159 Type 2 diabetes mellitus with other circulatory complications: Secondary | ICD-10-CM | POA: Diagnosis not present

## 2019-06-21 DIAGNOSIS — E785 Hyperlipidemia, unspecified: Secondary | ICD-10-CM

## 2019-06-21 DIAGNOSIS — I10 Essential (primary) hypertension: Secondary | ICD-10-CM

## 2019-06-21 LAB — POCT GLYCOSYLATED HEMOGLOBIN (HGB A1C): Hemoglobin A1C: 9.1 % — AB (ref 4.0–5.6)

## 2019-06-21 MED ORDER — OZEMPIC (0.25 OR 0.5 MG/DOSE) 2 MG/1.5ML ~~LOC~~ SOPN: 0.2500 mg | PEN_INJECTOR | SUBCUTANEOUS | 5 refills | Status: DC

## 2019-06-21 MED FILL — OZEMPIC 0.25 OR 0.5 MG/DOSE: 2 | 28 days supply | Qty: 2 | Fill #0

## 2019-06-21 NOTE — Assessment & Plan Note (Signed)
At goal.  Continue losartan 100 mg daily and metoprolol succinate 50 mg daily.

## 2019-06-21 NOTE — Assessment & Plan Note (Signed)
Lipitor recently increased to 80 mg daily.  We will continue this.  Also continue Zetia 10 mg daily.  Check lipid panel next blood draw.

## 2019-06-21 NOTE — Assessment & Plan Note (Signed)
[name] elevated at 9.1.  He is working on diet and exercise.  We will stop his Victoza and start Ozempic.  Start with 0.25 mg weekly for a few weeks and then increase to 0.5 mg weekly.  We will also continue Jardiance 25 mg daily and Metformin 1000 mg twice daily.  He will follow-up with me in a few months for CPE and we will recheck [name] at that time.

## 2019-06-21 NOTE — Progress Notes (Signed)
   Travis Owens is a 64 y.o. male who presents today for an office visit.  Assessment/Plan:  Chronic Problems Addressed Today: Hyperlipidemia associated with type 2 diabetes mellitus ([place]) Lipitor recently increased to 80 mg daily.  We will continue this.  Also continue Zetia 10 mg daily.  Check lipid panel next blood draw.  Type 2 diabetes mellitus with complication, without long-term current use of insulin ([name]) [name] elevated at 9.1.  He is working on diet and exercise.  We will stop his Victoza and start Ozempic.  Start with 0.25 mg weekly for a few weeks and then increase to 0.5 mg weekly.  We will also continue Jardiance 25 mg daily and Metformin 1000 mg twice daily.  He will follow-up with me in a few months for CPE and we will recheck [name] at that time.  Hypertension associated with diabetes ([place]) At goal.  Continue losartan 100 mg daily and metoprolol succinate 50 mg daily.     Subjective:  HPI:  See A/P.  `       Objective:  Physical Exam: BP 110/74   Pulse 77   Temp (!) 97.4 F (36.3 C)   Ht 5\' 8"  (1.727 m)   Wt 223 lb 6.1 oz (101.3 kg)   SpO2 97%   BMI 33.96 kg/m   Wt Readings from Last 3 Encounters:  06/21/19 223 lb 6.1 oz (101.3 kg)  06/18/19 220 lb (99.8 kg)  06/10/19 227 lb 9.6 oz (103.2 kg)  Gen: No acute distress, resting comfortably CV: Regular rate and rhythm with no murmurs appreciated Pulm: Normal work of breathing, clear to auscultation bilaterally with no crackles, wheezes, or rhonchi Neuro: Grossly normal, moves all extremities Psych: Normal affect and thought content      Hortense Cantrall M. [name], MD 06/21/2019 9:27 AM

## 2019-06-24 MED FILL — OZEMPIC 0.25 OR 0.5 MG/DOSE: 2 | 28 days supply | Qty: 2 | Fill #0

## 2019-06-26 ENCOUNTER — Telehealth: Payer: Self-pay | Admitting: *Deleted

## 2019-06-26 ENCOUNTER — Encounter: Payer: Self-pay | Admitting: Cardiology

## 2019-06-26 NOTE — Telephone Encounter (Signed)
Date of appointment for Cardiac MRI is Thursday 07/25/19 not 07/24/19

## 2019-06-26 NOTE — Telephone Encounter (Signed)
Left message for patient regarding appointment for Cardiac MRI scheduled 07/24/19 at 12:00pm at [facility]----arrival time 11:15 am 1st floor Radiology department.  Will mail information to patient and it is also in My [facility]

## 2019-07-02 ENCOUNTER — Ambulatory Visit (INDEPENDENT_AMBULATORY_CARE_PROVIDER_SITE_OTHER): Payer: No Typology Code available for payment source | Admitting: Family Medicine

## 2019-07-02 ENCOUNTER — Encounter (INDEPENDENT_AMBULATORY_CARE_PROVIDER_SITE_OTHER): Payer: Self-pay | Admitting: Family Medicine

## 2019-07-02 ENCOUNTER — Other Ambulatory Visit: Payer: Self-pay

## 2019-07-02 VITALS — BP 115/65 | HR 72 | Temp 98.1°F | Ht 68.0 in | Wt 220.0 lb

## 2019-07-02 DIAGNOSIS — E559 Vitamin D deficiency, unspecified: Secondary | ICD-10-CM

## 2019-07-02 DIAGNOSIS — E1165 Type 2 diabetes mellitus with hyperglycemia: Secondary | ICD-10-CM

## 2019-07-02 DIAGNOSIS — Z9189 Other specified personal risk factors, not elsewhere classified: Secondary | ICD-10-CM

## 2019-07-02 DIAGNOSIS — Z6833 Body mass index (BMI) 33.0-33.9, adult: Secondary | ICD-10-CM

## 2019-07-02 DIAGNOSIS — E785 Hyperlipidemia, unspecified: Secondary | ICD-10-CM

## 2019-07-02 DIAGNOSIS — E1169 Type 2 diabetes mellitus with other specified complication: Secondary | ICD-10-CM

## 2019-07-02 DIAGNOSIS — E669 Obesity, unspecified: Secondary | ICD-10-CM

## 2019-07-02 MED ORDER — VITAMIN D (ERGOCALCIFEROL) 1.25 MG (50000 UNIT) PO CAPS: 50000.0000 [IU] | ORAL_CAPSULE | ORAL | 0 refills | Status: DC

## 2019-07-02 MED FILL — VIT D2 1.25 MG (50,000 UNIT: 1.25 MG | 28 days supply | Qty: 4 | Fill #0

## 2019-07-02 NOTE — Progress Notes (Signed)
Chief Complaint:   OBESITY Travis Owens is here to discuss his progress with his obesity treatment plan along with follow-up of his obesity related diagnoses. Travis Owens is on the Category 4 Plan and states he is following his eating plan approximately 0% of the time. [name] states he walked 1 mile 2 times per week.  Today's visit was #: 5 Starting weight: 220 lbs Starting date: 04/30/2019 Today's weight: 220 lbs Today's date: 07/02/2019 Total lbs lost to date: 0 Total lbs lost since last in-office visit: 0  Interim History: [name] got his first COVID vaccine and the second vaccine is scheduled for March 23rd. [name] bought food, it's at home and patient does not foresee any obstacles. [place].  Subjective:   Vitamin D deficiency  Travis Owens's Vitamin D level was 25.8 on 04/30/19. He is on prescription vit D. He admits fatigue and denies nausea, vomiting or muscle weakness. Labs were discussed with patient today.  Type 2 diabetes mellitus with hyperglycemia, without long-term current use of insulin ([name]) Travis Owens has no hypoglycemia. He is on Ozempic and Jardiance with no side effects.  Lab Results  Component Value Date   HGBA1C 9.1 (A) 06/21/2019   HGBA1C 9.5 (H) 04/30/2019   HGBA1C 8.3 (A) 12/17/2018   Lab Results  Component Value Date   LDLCALC 75 04/30/2019   CREATININE 0.62 (L) 04/30/2019   Lab Results  Component Value Date   INSULIN 24.2 04/30/2019   Hyperlipidemia associated with type 2 diabetes mellitus ([place]) Lipitor was increased to 80 mg daily. No side effects are noted.  Lab Results  Component Value Date   CHOL 163 04/30/2019   HDL 55 04/30/2019   LDLCALC 75 04/30/2019   TRIG 201 (H) 04/30/2019   CHOLHDL 2 09/12/2018   Lab Results  Component Value Date   ALT 30 04/30/2019   AST 26 04/30/2019   ALKPHOS 73 04/30/2019   BILITOT 0.3 04/30/2019   The 10-year ASCVD risk score Travis Bussing DC Jr., et al., 2013) is: 15.8%   Values used to  calculate the score:     Age: 34 years     Sex: Male     Is Non-Hispanic African American: No     Diabetic: Yes     Tobacco smoker: No     Systolic Blood Pressure: [id number] mmHg     Is BP treated: Yes     HDL Cholesterol: 55 mg/dL     Total Cholesterol: 163 mg/dL  At risk for osteoporosis Harlow is at higher risk of osteopenia and osteoporosis due to Vitamin D deficiency.   Assessment/Plan:   Vitamin D deficiency  Low Vitamin D level contributes to fatigue and are associated with obesity, breast, and colon cancer. [name] agrees to continue to take prescription Vitamin D @50 ,000 IU every week #4 with no refills and he will follow-up for routine testing of Vitamin D, at least 2-3 times per year to avoid over-replacement.  Type 2 diabetes mellitus with hyperglycemia, without long-term current use of insulin ([name]) Travis Owens will continue his current medications (no refill needed). He will start checking his fasting blood sugar 3 to 4 times per week at least.  Hyperlipidemia associated with type 2 diabetes mellitus ([place]) Travis Owens will continue his current medications and he will continue to work on diet, exercise and weight loss efforts. Orders and follow up as documented in patient record.   At risk for osteoporosis Travis Owens was given approximately  15 minutes of osteoporosis prevention counseling today. Travis Owens is at risk for osteopenia and osteoporosis due to his Vitamin D deficiency. He was encouraged to take his Vitamin D and follow his higher calcium diet and increase strengthening exercise to help strengthen his bones and decrease his risk of osteopenia and osteoporosis.  Repetitive spaced learning was employed today to elicit superior memory formation and behavioral change.  Class 1 obesity with serious comorbidity and body mass index (BMI) of 33.0 to 33.9 in adult, unspecified obesity type Travis Owens is currently in the action stage of change. As such, his goal is to continue with weight loss efforts. He has  agreed to the Category 4 Plan.   Exercise goals: Travis Owens will continue his current exercise regimen.  Behavioral modification strategies: increasing lean protein intake, increasing vegetables, meal planning and cooking strategies, keeping healthy foods in the home and planning for success.  [name] has agreed to follow-up with our clinic in 2 weeks. He was informed of the importance of frequent follow-up visits to maximize his success with intensive lifestyle modifications for his multiple health conditions.   Objective:   Blood pressure 115/65, pulse 72, temperature 98.1 F (36.7 C), temperature source Oral, height 5\' 8"  (1.727 m), weight 220 lb (99.8 kg), SpO2 98 %. Body mass index is 33.45 kg/m.  General: Cooperative, alert, well developed, in no acute distress. HEENT: Conjunctivae and lids unremarkable. Cardiovascular: Regular rhythm.  Lungs: Normal work of breathing. Neurologic: No focal deficits.   Lab Results  Component Value Date   CREATININE 0.62 (L) 04/30/2019   BUN 13 04/30/2019   NA 138 04/30/2019   K 4.4 04/30/2019   CL 101 04/30/2019   CO2 20 04/30/2019   Lab Results  Component Value Date   ALT 30 04/30/2019   AST 26 04/30/2019   ALKPHOS 73 04/30/2019   BILITOT 0.3 04/30/2019   Lab Results  Component Value Date   HGBA1C 9.1 (A) 06/21/2019   HGBA1C 9.5 (H) 04/30/2019   HGBA1C 8.3 (A) 12/17/2018   HGBA1C 9.5 (H) 09/12/2018   HGBA1C 7.8 09/07/2017   Lab Results  Component Value Date   INSULIN 24.2 04/30/2019   Lab Results  Component Value Date   TSH 1.460 04/30/2019   Lab Results  Component Value Date   CHOL 163 04/30/2019   HDL 55 04/30/2019   LDLCALC 75 04/30/2019   TRIG 201 (H) 04/30/2019   CHOLHDL 2 09/12/2018   Lab Results  Component Value Date   WBC 8.3 04/30/2019   HGB 14.1 04/30/2019   HCT 42.1 04/30/2019   MCV 87 04/30/2019   PLT 273 04/30/2019   No results found for: IRON, TIBC, FERRITIN   Ref. Range 04/30/2019 11:45  Vitamin D,  25-Hydroxy Latest Ref Range: 30.0 - 100.0 ng/mL 25.8 (L)    Attestation Statements:   Reviewed by clinician on day of visit: allergies, medications, problem list, medical history, surgical history, family history, social history, and previous encounter notes.  I, Doreene Nest, am acting as transcriptionist for Coralie Common, MD.  I have reviewed the above documentation for accuracy and completeness, and I agree with the above. - Ilene Qua, MD

## 2019-07-09 ENCOUNTER — Telehealth: Payer: Self-pay | Admitting: Cardiology

## 2019-07-09 MED FILL — EZETIMIBE 10 MG TABS: 10 | 90 days supply | Qty: 90 | Fill #2

## 2019-07-09 MED FILL — PANTOPRAZOLE SOD DR 40 MG T: 40 | 90 days supply | Qty: 90 | Fill #2

## 2019-07-09 MED FILL — LOSARTAN POTASSIUM 100 MG T: 100 | 90 days supply | Qty: 90 | Fill #3

## 2019-07-09 MED FILL — ESCITALOPRAM 20 MG TABLET: 20 | 90 days supply | Qty: 90 | Fill #2

## 2019-07-09 MED FILL — metFORMIN HCL 1000 MG TABS: 1000 | 90 days supply | Qty: 180 | Fill #2

## 2019-07-09 NOTE — Telephone Encounter (Signed)
Patient states his sleep study was not pre certified.  He also states he will need a BIPAP machine instead of a CPAP and wants to make sure that will be pre certified.

## 2019-07-09 NOTE — Telephone Encounter (Signed)
Returned a call to patient. I informed the patient that unless they changed their previous protocol, precerts have not been required for sleep studies as long as they go to a [facility] facility. I will call them to see if they will allow me to do a retro PA. He also asked me about his cardiac CT auth.states that he was told by "someone" that no PA is required but [facility] told him that it is. I told him that I would not know about that. I just do the pre certs for sleep studies, but I will find out for him when I call them. Also notified the patient that his order for the BIPAP machine was sent to [facility] on 05/28/19. I will contact them for status update.

## 2019-07-10 MED FILL — JARDIANCE 25 MG TABLET: 25 | 60 days supply | Qty: 60 | Fill #3

## 2019-07-15 ENCOUNTER — Ambulatory Visit (INDEPENDENT_AMBULATORY_CARE_PROVIDER_SITE_OTHER): Payer: No Typology Code available for payment source | Admitting: Family Medicine

## 2019-07-24 ENCOUNTER — Telehealth (HOSPITAL_COMMUNITY): Payer: Self-pay | Admitting: Emergency Medicine

## 2019-07-24 ENCOUNTER — Encounter (HOSPITAL_COMMUNITY): Payer: Self-pay

## 2019-07-24 NOTE — Telephone Encounter (Signed)
Attempted to call patient regarding upcoming cardiac CT appointment. °Left message on voicemail with name and callback number °Charlott Calvario RN Navigator Cardiac Imaging °[facility] °[phone] Office °[phone] Cell ° °

## 2019-07-24 NOTE — Telephone Encounter (Signed)
Pt returning phone call regarding upcoming cardiac imaging study; pt verbalizes understanding of appt date/time, parking situation and where to check in, and verified current allergies; name and call back number provided for further questions should they arise Irving Lubbers RN Navigator Cardiac Imaging [facility] [phone] office [phone] cell   Pt denies implants, denies claustro  

## 2019-07-25 ENCOUNTER — Ambulatory Visit (HOSPITAL_COMMUNITY)
Admission: RE | Admit: 2019-07-25 | Discharge: 2019-07-25 | Disposition: A | Discharge status: Home / Self Care | Payer: No Typology Code available for payment source | Source: Ambulatory Visit | Attending: Cardiology | Admitting: Cardiology

## 2019-07-25 ENCOUNTER — Other Ambulatory Visit: Payer: Self-pay

## 2019-07-25 DIAGNOSIS — I428 Other cardiomyopathies: Secondary | ICD-10-CM

## 2019-07-25 MED ORDER — GADOBUTROL 1 MMOL/ML IV SOLN
12.0000 mL | Freq: Once | INTRAVENOUS | Status: AC | PRN
  Administered 2019-07-25: 12 mL via INTRAVENOUS

## 2019-07-25 MED FILL — OZEMPIC 0.25 OR 0.5 MG/DOSE: 2 | 28 days supply | Qty: 2 | Fill #1

## 2019-07-29 ENCOUNTER — Encounter (INDEPENDENT_AMBULATORY_CARE_PROVIDER_SITE_OTHER): Payer: Self-pay | Admitting: Family Medicine

## 2019-07-29 ENCOUNTER — Other Ambulatory Visit: Payer: Self-pay

## 2019-07-29 ENCOUNTER — Ambulatory Visit (INDEPENDENT_AMBULATORY_CARE_PROVIDER_SITE_OTHER): Payer: No Typology Code available for payment source | Admitting: Family Medicine

## 2019-07-29 VITALS — BP 111/68 | HR 74 | Temp 97.8°F | Ht 68.0 in | Wt 214.0 lb

## 2019-07-29 DIAGNOSIS — E559 Vitamin D deficiency, unspecified: Secondary | ICD-10-CM

## 2019-07-29 DIAGNOSIS — G4733 Obstructive sleep apnea (adult) (pediatric): Secondary | ICD-10-CM | POA: Diagnosis not present

## 2019-07-29 DIAGNOSIS — Z6832 Body mass index (BMI) 32.0-32.9, adult: Secondary | ICD-10-CM

## 2019-07-29 DIAGNOSIS — Z9189 Other specified personal risk factors, not elsewhere classified: Secondary | ICD-10-CM | POA: Diagnosis not present

## 2019-07-29 DIAGNOSIS — E1165 Type 2 diabetes mellitus with hyperglycemia: Secondary | ICD-10-CM

## 2019-07-29 DIAGNOSIS — E669 Obesity, unspecified: Secondary | ICD-10-CM

## 2019-07-29 MED ORDER — VITAMIN D (ERGOCALCIFEROL) 1.25 MG (50000 UNIT) PO CAPS: 50000.0000 [IU] | ORAL_CAPSULE | ORAL | 0 refills | Status: DC

## 2019-07-29 MED FILL — VIT D2 1.25 MG (50,000 UNIT: 1.25 MG | 28 days supply | Qty: 4 | Fill #0

## 2019-07-29 NOTE — Progress Notes (Signed)
Chief Complaint:   OBESITY Travis Owens is here to discuss his progress with his obesity treatment plan along with follow-up of his obesity related diagnoses. Travis Owens is on the Category 4 Plan and states he is following his eating plan approximately 25% of the time. Travis Owens states he is walking for 20 minutes 2 times per week.  Today's visit was #: 6 Starting weight: 220 lbs Starting date: 04/30/2019 Today's weight: 214 lbs Today's date: 07/29/2019 Total lbs lost to date: 6 Total lbs lost since last in-office visit: 6  Interim History: Travis Owens has done better with weight loss on his Category 4 plan. His wife is in the program as well but there has been an increase in eating out.  Subjective:   1. Vitamin D deficiency Travis Owens is stable on Vit D, but his Vit D level is not yet at goal. He notes fatigue.  2. Severe obstructive sleep apnea Travis Owens states a recent sleep study showed severe obstructive sleep apnea, and he needs a BIPAP which he is waiting to have delivered.  3. Type 2 diabetes mellitus with hyperglycemia, without long-term current use of insulin ([name]) Travis Owens is not checking his BGs at home. He denies hypoglycemia. Last [name] was elevated at 9.1, but had been improving.  4. At risk for heart disease Travis Owens is at a higher than average risk for cardiovascular disease due to uncontrolled obstructive sleep apnea.   Assessment/Plan:   1. Vitamin D deficiency Low Vitamin D level contributes to fatigue and are associated with obesity, breast, and colon cancer. We will refill prescription Vitamin D for 1 month. Travis Owens will follow-up for routine testing of Vitamin D, at least 2-3 times per year to avoid over-replacement. We will recheck labs in 1 month.  - Vitamin D, Ergocalciferol, (DRISDOL) 1.25 MG (50000 UNIT) CAPS capsule; Take 1 capsule (50,000 Units total) by mouth every 7 (seven) days.  Dispense: 4 capsule; Refill: 0  2. Severe obstructive sleep apnea Travis Owens was encouraged to use BiPAP nightly as  this could decrease his cardiac risk, and improve metabolism. He feels he will do well with BiPAP once he gets it. Intensive lifestyle modifications are the first line treatment for this issue. We discussed several lifestyle modifications today and he will continue to work on diet, exercise and weight loss efforts. We will continue to monitor. Orders and follow up as documented in patient record.   3. Type 2 diabetes mellitus with hyperglycemia, without long-term current use of insulin ([name]) Good blood sugar control is important to decrease the likelihood of diabetic complications such as nephropathy, neuropathy, limb loss, blindness, coronary artery disease, and death. Intensive lifestyle modification including diet, exercise and weight loss are the first line of treatment for diabetes. Travis Owens agreed to start checking his BGs at least 1 time daily, and will continue to work on diet and exercise.  4. At risk for heart disease Travis Owens was given approximately 15 minutes of coronary artery disease prevention counseling today. He is 64 y.o. male and has risk factors for heart disease including obesity. We discussed intensive lifestyle modifications today with an emphasis on specific weight loss instructions and strategies.   Repetitive spaced learning was employed today to elicit superior memory formation and behavioral change.  5. Class 1 obesity with serious comorbidity and body mass index (BMI) of 32.0 to 32.9 in adult, unspecified obesity type Travis Owens is currently in the action stage of change. As such, his goal is to continue with weight loss efforts. He has  agreed to the Category 4 Plan.   Exercise goals: Travis Owens's goal is to increase walking to 30 minutes 5 times per week.  Behavioral modification strategies: decreasing eating out.  Travis Owens has agreed to follow-up with our clinic in 2 to 3 weeks with Dr. Elwyn Lade. He was informed of the importance of frequent follow-up visits to maximize his success with  intensive lifestyle modifications for his multiple health conditions.   Objective:   Blood pressure 111/68, pulse 74, temperature 97.8 F (36.6 C), temperature source Oral, height 5\' 8"  (1.727 m), weight 214 lb (97.1 kg), SpO2 95 %. Body mass index is 32.54 kg/m.  General: Cooperative, alert, well developed, in no acute distress. HEENT: Conjunctivae and lids unremarkable. Cardiovascular: Regular rhythm.  Lungs: Normal work of breathing. Neurologic: No focal deficits.   Lab Results  Component Value Date   CREATININE 0.62 (L) 04/30/2019   BUN 13 04/30/2019   NA 138 04/30/2019   K 4.4 04/30/2019   CL 101 04/30/2019   CO2 20 04/30/2019   Lab Results  Component Value Date   ALT 30 04/30/2019   AST 26 04/30/2019   ALKPHOS 73 04/30/2019   BILITOT 0.3 04/30/2019   Lab Results  Component Value Date   HGBA1C 9.1 (A) 06/21/2019   HGBA1C 9.5 (H) 04/30/2019   HGBA1C 8.3 (A) 12/17/2018   HGBA1C 9.5 (H) 09/12/2018   HGBA1C 7.8 09/07/2017   Lab Results  Component Value Date   INSULIN 24.2 04/30/2019   Lab Results  Component Value Date   TSH 1.460 04/30/2019   Lab Results  Component Value Date   CHOL 163 04/30/2019   HDL 55 04/30/2019   LDLCALC 75 04/30/2019   TRIG 201 (H) 04/30/2019   CHOLHDL 2 09/12/2018   Lab Results  Component Value Date   WBC 8.3 04/30/2019   HGB 14.1 04/30/2019   HCT 42.1 04/30/2019   MCV 87 04/30/2019   PLT 273 04/30/2019   No results found for: IRON, TIBC, FERRITIN  Attestation Statements:   Reviewed by clinician on day of visit: allergies, medications, problem list, medical history, surgical history, family history, social history, and previous encounter notes.   I, Trixie Dredge, am acting as transcriptionist for Dennard Nip, MD.  I have reviewed the above documentation for accuracy and completeness, and I agree with the above. -  Dennard Nip, MD

## 2019-08-13 ENCOUNTER — Other Ambulatory Visit: Payer: Self-pay

## 2019-08-13 ENCOUNTER — Encounter (INDEPENDENT_AMBULATORY_CARE_PROVIDER_SITE_OTHER): Payer: Self-pay | Admitting: Family Medicine

## 2019-08-13 ENCOUNTER — Ambulatory Visit (INDEPENDENT_AMBULATORY_CARE_PROVIDER_SITE_OTHER): Payer: No Typology Code available for payment source | Admitting: Family Medicine

## 2019-08-13 VITALS — BP 99/63 | HR 82 | Temp 97.9°F | Ht 68.0 in | Wt 209.0 lb

## 2019-08-13 DIAGNOSIS — E559 Vitamin D deficiency, unspecified: Secondary | ICD-10-CM

## 2019-08-13 DIAGNOSIS — Z9189 Other specified personal risk factors, not elsewhere classified: Secondary | ICD-10-CM

## 2019-08-13 DIAGNOSIS — Z6831 Body mass index (BMI) 31.0-31.9, adult: Secondary | ICD-10-CM

## 2019-08-13 DIAGNOSIS — E1165 Type 2 diabetes mellitus with hyperglycemia: Secondary | ICD-10-CM

## 2019-08-13 DIAGNOSIS — E669 Obesity, unspecified: Secondary | ICD-10-CM

## 2019-08-13 MED ORDER — JARDIANCE 10 MG PO TABS: 10.0000 mg | ORAL_TABLET | Freq: Every day | ORAL | 0 refills | Status: DC

## 2019-08-13 MED ORDER — VITAMIN D (ERGOCALCIFEROL) 1.25 MG (50000 UNIT) PO CAPS: 50000.0000 [IU] | ORAL_CAPSULE | ORAL | 0 refills | Status: DC

## 2019-08-13 MED FILL — JARDIANCE 10 MG TABLET: 10 | 30 days supply | Qty: 30 | Fill #0

## 2019-08-15 NOTE — Progress Notes (Signed)
Chief Complaint:   OBESITY Travis Owens is here to discuss his progress with his obesity treatment plan along with follow-up of his obesity related diagnoses. Travis Owens is on the Category 4 Plan and states he is following his eating plan approximately 50% of the time. Travis Owens states he is walking and doing yard work for 10 hours per week.  Today's visit was #: 7 Starting weight: 220 lbs Starting date: 04/30/2019 Today's weight: 209 lbs Today's date: 08/13/2019 Total lbs lost to date: 11 Total lbs lost since last in-office visit: 5  Interim History: Travis Owens voices the last few weeks have been better. He voices he's been mindful of the food he is eating. Often at dinner doing a can of beans and hotdogs. He has cut out most carbohydrates like bread. He is doing oatmeal in the morning and then things like yogurt.  Subjective:   1. Vitamin D deficiency Travis Owens Owens denies nausea, vomiting, or muscle weakness, but notes fatigue. He is on prescription Vit D.  2. Type 2 diabetes mellitus with hyperglycemia, without long-term current use of insulin ([name]) Travis Owens Owens's fasting BGs range between 111 and 138 with post prandials ranging between 107 and 139. He denies feelings of hypoglycemia. His goal is to be off Jardiance within the next month or two.  3. At risk for osteoporosis Travis Owens Owens is at higher risk of osteopenia and osteoporosis due to Vitamin D deficiency.   Assessment/Plan:   1. Vitamin D deficiency Low Vitamin D level contributes to fatigue and are associated with obesity, breast, and colon cancer. We will refill prescription Vitamin D for 1 month. Travis Owens Owens will follow-up for routine testing of Vitamin D, at least 2-3 times per year to avoid over-replacement.  - Vitamin D, Ergocalciferol, (DRISDOL) 1.25 MG (50000 UNIT) CAPS capsule; Take 1 capsule (50,000 Units total) by mouth every 7 (seven) days.  Dispense: 4 capsule; Refill: 0  2. Type 2 diabetes mellitus with hyperglycemia, without long-term current use of insulin  ([name]) Good blood sugar control is important to decrease the likelihood of diabetic complications such as nephropathy, neuropathy, limb loss, blindness, coronary artery disease, and death. Intensive lifestyle modification including diet, exercise and weight loss are the first line of treatment for diabetes. My agreed to change Jardiance to 10 mg PO daily.  - empagliflozin (JARDIANCE) 10 MG TABS tablet; Take 10 mg by mouth daily before breakfast.  Dispense: 30 tablet; Refill: 0  3. At risk for osteoporosis Travis Owens Owens was given approximately 15 minutes of osteoporosis prevention counseling today. Travis Owens is at risk for osteopenia and osteoporosis due to his Vitamin D deficiency. He was encouraged to take his Vitamin D and follow his higher calcium diet and increase strengthening exercise to help strengthen his bones and decrease his risk of osteopenia and osteoporosis.  Repetitive spaced learning was employed today to elicit superior memory formation and behavioral change.  4. Class 1 obesity with serious comorbidity and body mass index (BMI) of 31.0 to 31.9 in adult, unspecified obesity type Travis Owens Owens is currently in the action stage of change. As such, his goal is to continue with weight loss efforts. He has agreed to the Category 4 Plan.   Exercise goals: As is.  Behavioral modification strategies: increasing lean protein intake, increasing vegetables, meal planning and cooking strategies and planning for success.  Travis Owens has agreed to follow-up with our clinic in 2 weeks. He was informed of the importance of frequent follow-up visits to maximize his success with intensive lifestyle modifications for his multiple health  conditions.   Objective:   Blood pressure 99/63, pulse 82, temperature 97.9 F (36.6 C), temperature source Oral, height 5\' 8"  (1.727 m), weight 209 lb (94.8 kg), SpO2 96 %. Body mass index is 31.78 kg/m.  General: Cooperative, alert, well developed, in no acute distress. HEENT:  Conjunctivae and lids unremarkable. Cardiovascular: Regular rhythm.  Lungs: Normal work of breathing. Neurologic: No focal deficits.   Lab Results  Component Value Date   CREATININE 0.62 (L) 04/30/2019   BUN 13 04/30/2019   NA 138 04/30/2019   K 4.4 04/30/2019   CL 101 04/30/2019   CO2 20 04/30/2019   Lab Results  Component Value Date   ALT 30 04/30/2019   AST 26 04/30/2019   ALKPHOS 73 04/30/2019   BILITOT 0.3 04/30/2019   Lab Results  Component Value Date   HGBA1C 9.1 (A) 06/21/2019   HGBA1C 9.5 (H) 04/30/2019   HGBA1C 8.3 (A) 12/17/2018   HGBA1C 9.5 (H) 09/12/2018   HGBA1C 7.8 09/07/2017   Lab Results  Component Value Date   INSULIN 24.2 04/30/2019   Lab Results  Component Value Date   TSH 1.460 04/30/2019   Lab Results  Component Value Date   CHOL 163 04/30/2019   HDL 55 04/30/2019   LDLCALC 75 04/30/2019   TRIG 201 (H) 04/30/2019   CHOLHDL 2 09/12/2018   Lab Results  Component Value Date   WBC 8.3 04/30/2019   HGB 14.1 04/30/2019   HCT 42.1 04/30/2019   MCV 87 04/30/2019   PLT 273 04/30/2019   No results found for: IRON, TIBC, FERRITIN  Attestation Statements:   Reviewed by clinician on day of visit: allergies, medications, problem list, medical history, surgical history, family history, social history, and previous encounter notes.   I, Trixie Dredge, am acting as transcriptionist for April Manson, MD.  I have reviewed the above documentation for accuracy and completeness, and I agree with the above. - Ilene Qua, MD

## 2019-08-18 NOTE — Progress Notes (Signed)
Cardiology Office Note:    Date:  08/19/2019   ID:  Travis Owens, DOB 1955/09/12, MRN [id number]  PCP:  [name], MD  Cardiologist:  No primary care provider on file.  Electrophysiologist:  None   Referring MD: [name], MD   Chief Complaint  Patient presents with  . Congestive Heart Failure    History of Present Illness:    Travis Owens is a 64 y.o. male with a hx of chronic combined systolic and diastolic heart failure, type 2 diabetes, hypertension, hyperlipidemia, OSA who present for follow-up.  He was is referred by Dr. Leafy Ro for evaluation of left bundle branch block on 05/01/18.  He was referred to Dr. Leafy Ro for weight loss evaluation, an EKG was done which showed left bundle branch block.  TTE was done on 05/03/2019 and showed EF 40 to 45%, with marked marked septal-lateral dyssynchrony consistent with LBBB.  Coronary CTA on 06/06/2019 showed nonobstructive CAD (calcified plaque in the RPDA causing mild (25-49%) stenosis and calcified plaque in the proximal LAD and proximal RCA causing minimal (0-24%) stenosis).  Calcium score 111 (62nd percentile for age/gender).  CMR on 07/25/2019 showed EF 45%, no LGE, RV EF 37%.  Since last clinic visit, he reports that he has been doing well.  Has lost 10 pounds.  States that he is eating small meals throughout the day.  Has not been exercising but plans to start walking.  He denies any shortness of breath or chest pain.  Does report some lightheadedness with bending over too fast.  Had positive sleep study, was recommended to start on BiPAP but has not started yet.   Past Medical History:  Diagnosis Date  . Anxiety   . Chest pain   . Depression   . Diabetes mellitus without complication ([place])   . Fatty liver   . GERD (gastroesophageal reflux disease)   . Hyperlipidemia   . Hypertension   . Sleep apnea    has CPAP/does not use    Past Surgical History:  Procedure Laterality Date  . COLONOSCOPY     Dr. Olevia Perches  2008     Current Medications: Current Meds  Medication Sig  . aspirin EC 81 MG tablet Take 81 mg by mouth daily.  Marland Kitchen atorvastatin (LIPITOR) 80 MG tablet Take 1 tablet (80 mg total) by mouth daily at 6 PM.  . Blood Glucose Monitoring Suppl (FREESTYLE LITE) DEVI Check Blood sugar twice daily  . empagliflozin (JARDIANCE) 10 MG TABS tablet Take 10 mg by mouth daily before breakfast.  . escitalopram (LEXAPRO) 20 MG tablet Take 1 tablet (20 mg total) by mouth daily.  Marland Kitchen ezetimibe (ZETIA) 10 MG tablet Take 1 tablet (10 mg total) by mouth daily.  Marland Kitchen glucose blood (FREESTYLE LITE) test strip Check Blood sugar twice daily  . Lancets (FREESTYLE) lancets Check blood sugar twice daily  . losartan (COZAAR) 100 MG tablet Take 1 tablet (100 mg total) by mouth daily.  . metFORMIN (GLUCOPHAGE) 1000 MG tablet Take 1 tablet (1,000 mg total) by mouth 2 (two) times daily.  . metoprolol succinate (TOPROL XL) 100 MG 24 hr tablet Take 1 tablet (100 mg total) by mouth daily.  . Multiple Vitamin (MULTIVITAMIN) tablet Take 1 tablet by mouth daily.  . pantoprazole (PROTONIX) 40 MG tablet Take 1 tablet (40 mg total) by mouth daily.  . Semaglutide,0.25 or 0.5MG /DOS, (OZEMPIC, 0.25 OR 0.5 MG/DOSE,) 2 MG/1.5ML SOPN Inject 0.25-0.5 mg into the skin once a week.  Marland Kitchen  Vitamin D, Ergocalciferol, (DRISDOL) 1.25 MG (50000 UNIT) CAPS capsule Take 1 capsule (50,000 Units total) by mouth every 7 (seven) days.  . [DISCONTINUED] metoprolol succinate (TOPROL XL) 50 MG 24 hr tablet Take 1 tablet (50 mg total) by mouth daily.     Allergies:   Patient has no known allergies.   Social History   Socioeconomic History  . Marital status: Married    Spouse name: [name]  . Number of children: Not on file  . Years of education: Not on file  . Highest education level: Not on file  Occupational History  . Occupation: Theme park manager    Comment: [employer]  Tobacco Use  . Smoking status: Never Smoker  . Smokeless tobacco: Never Used    Substance and Sexual Activity  . Alcohol use: No  . Drug use: No  . Sexual activity: Yes  Other Topics Concern  . Not on file  Social History Narrative  . Not on file   Social Determinants of Health   Financial Resource Strain:   . Difficulty of Paying Living Expenses:   Food Insecurity:   . Worried About Charity fundraiser in the Last Year:   . Arboriculturist in the Last Year:   Transportation Needs:   . Film/video editor (Medical):   Marland Kitchen Lack of Transportation (Non-Medical):   Physical Activity:   . Days of Exercise per Week:   . Minutes of Exercise per Session:   Stress:   . Feeling of Stress :   Social Connections:   . Frequency of Communication with Friends and Family:   . Frequency of Social Gatherings with Friends and Family:   . Attends Religious Services:   . Active Member of Clubs or Organizations:   . Attends Archivist Meetings:   Marland Kitchen Marital Status:      Family History: The patient's family history includes Cancer in his father; Hypertension in his paternal grandmother; Stroke in his father and paternal grandfather. There is no history of Colon cancer, Colon polyps, Esophageal cancer, Rectal cancer, or Stomach cancer.  ROS:   Please see the history of present illness.     All other systems reviewed and are negative.  EKGs/Labs/Other Studies Reviewed:    The following studies were reviewed today:   EKG:  EKG is not ordered today.  The last ekg demonstrates normal sinus rhythm, rate 82, left bundle branch block  TTE 05/03/19:  1. Left ventricular ejection fraction, by visual estimation, is 40 to 45%. The left ventricle has mild to moderately decreased function. There is mildly increased left ventricular hypertrophy. There is marked septal-lateral dyssynchrony consistent with  LBBB.  2. Left ventricular diastolic parameters are consistent with Grade I diastolic dysfunction (impaired relaxation).  3. Global right ventricle has normal systolic  function.The right ventricular size is normal. No increase in right ventricular wall thickness.  4. Left atrial size was normal.  5. Right atrial size was normal.  6. The mitral valve is normal in structure. No evidence of mitral valve regurgitation. No evidence of mitral stenosis.  7. The tricuspid valve is normal in structure.  8. The aortic valve is tricuspid. Aortic valve regurgitation is not visualized. Mild aortic valve sclerosis without stenosis  9. TR signal is inadequate for assessing pulmonary artery systolic pressure. 10. The inferior vena cava is normal in size with greater than 50% respiratory variability, suggesting right atrial pressure of 3 mmHg.  Coronry CTA 06/06/19: 1. Coronary calcium  score of 111. This was 74 percentile for age and sex matched control.  2. Normal coronary origin with right dominance.  3. Nonobstructive CAD. There is calcified plaque in the RPDA causing mild (25-49%) stenosis and calcified plaque in the proximal LAD and proximal RCA causing minimal (0-24%) stenosis  CAD-RADS 2. Mild non-obstructive CAD (25-49%). Consider non-atherosclerotic causes of chest pain. Consider preventive therapy and risk factor modification.1. Coronary calcium score of 111. This was 22 percentile for age and sex matched control.  2. Normal coronary origin with right dominance.  3. Nonobstructive CAD. There is calcified plaque in the RPDA causing mild (25-49%) stenosis and calcified plaque in the proximal LAD and proximal RCA causing minimal (0-24%) stenosis  CAD-RADS 2. Mild non-obstructive CAD (25-49%). Consider non-atherosclerotic causes of chest pain. Consider preventive therapy and risk factor modification.  Noncardiac: IMPRESSION: No significant noncardiac findings. Incidental 6 mm densely calcified granuloma in the lingula.  CMR 07/25/19: 1. Normal LV size and thickness with markedly abnormal septal motion EF 45%. 2.  Possible LV non compaction seen  best in SA/3 chamber views 3. No delayed gadolinium uptake, scar, infiltration, infarct in LV myocardium 4.  Basal RV dilatation and hypokinesis RVEF 37% 5   normal MV,AV, TV 6.  Normal Aortic root 3.0 cm 7.  Normal T2* 48 msec 8. Unable to calculate T1/ECG failure to acquire adequate T1 map sequence pre contrast    Recent Labs: 04/30/2019: ALT 30; BUN 13; Creatinine, Ser 0.62; Hemoglobin 14.1; Platelets 273; Potassium 4.4; Sodium 138; TSH 1.460  Recent Lipid Panel    Component Value Date/Time   CHOL 163 04/30/2019 1145   TRIG 201 (H) 04/30/2019 1145   HDL 55 04/30/2019 1145   CHOLHDL 2 09/12/2018 1100   VLDL 35.8 09/12/2018 1100   LDLCALC 75 04/30/2019 1145    Physical Exam:    VS:  BP 128/72   Pulse 75   Ht 5\' 10"  (1.778 m)   Wt 217 lb 3.2 oz (98.5 kg)   SpO2 98%   BMI 31.16 kg/m     Wt Readings from Last 3 Encounters:  08/19/19 217 lb 3.2 oz (98.5 kg)  08/13/19 209 lb (94.8 kg)  07/29/19 214 lb (97.1 kg)     GEN:   in no acute distress HEENT: Normal NECK: No JVD CARDIAC:RRR, no murmurs, rubs, gallops RESPIRATORY:  Clear to auscultation without rales, wheezing or rhonchi  ABDOMEN: Soft, non-tender, non-distended MUSCULOSKELETAL:  No edema; No deformity  SKIN: Warm and dry NEUROLOGIC:  Alert and oriented x 3 PSYCHIATRIC:  Normal affect   ASSESSMENT:    1. Chronic combined systolic and diastolic heart failure ([place])   2. Coronary artery disease involving native coronary artery of native heart without angina pectoris   3. Essential hypertension   4. Hyperlipidemia, unspecified hyperlipidemia type   5. OSA (obstructive sleep apnea)    PLAN:     Chronic combined systolic and diastolic heart failure:: EF 40 to 45% on TTE from 05/02/18, with marked marked septal-lateral dyssynchrony consistent with LBBB.  Coronary CTA showed nonobstructive CAD.  CMR showed EF 45%, no LGE.  Appears euvolemic -Continue losartan 100 mg daily -Increase Toprol-XL to 100 mg  daily  Coronary artery disease: coronary CTA on 06/06/2019 showed nonobstructive CAD (calcified plaque in the RPDA causing mild (25-49%) stenosis and calcified plaque in the proximal LAD and proximal RCA causing minimal (0-24%) stenosis.  Calcium score 111 (62nd percentile for age/gender). -Continue atorvastatin 80 mg daily  Hypertension: On losartan 100  mg daily and will increase Toprol-XL to 100 mg daily as above  Hyperlipidemia: was on Atorvastatin 40 mg daily and Zetia 10 mg daily at time of last LDL 75 on 04/30/19.  Atorvastatin increased to 80 mg daily for goal LDL less than 70  Type 2 diabetes: [name] 9.5.   On metformin, Victoza, and Jardiance   OSA: diagnosed years ago, does not use CPAP. Sleep study on 05/15/2019 confirmed OSA, he is being started on BiPAP but has not started yet  RTC in 3 months  Medication Adjustments/Labs and Tests Ordered: Current medicines are reviewed at length with the patient today.  Concerns regarding medicines are outlined above.  No orders of the defined types were placed in this encounter.  Meds ordered this encounter  Medications  . metoprolol succinate (TOPROL XL) 100 MG 24 hr tablet    Sig: Take 1 tablet (100 mg total) by mouth daily.    Dispense:  90 tablet    Refill:  3    Dose increase    Patient Instructions  Medication Instructions:  INCREASE metoprolol succinate (Toprol XL) to 100 mg daily  *If you need a refill on your cardiac medications before your next appointment, please call your pharmacy*  Follow-Up: At [facility], you and your health needs are our priority.  As part of our continuing mission to provide you with exceptional heart care, we have created designated Provider Care Teams.  These Care Teams include your primary Cardiologist (physician) and Advanced Practice Providers (APPs -  Physician Assistants and Nurse Practitioners) who all work together to provide you with the care you need, when you need it.  We recommend  signing up for the patient portal called "MyChart".  Sign up information is provided on this After Visit Summary.  MyChart is used to connect with patients for Virtual Visits (Telemedicine).  Patients are able to view lab/test results, encounter notes, upcoming appointments, etc.  Non-urgent messages can be sent to your provider as well.   To learn more about what you can do with MyChart, go to [email or web address].    Your next appointment:   3 month(s)  The format for your next appointment:   In Person  Provider:   Oswaldo Milian, MD        Signed, Donato Heinz, MD  08/19/2019 7:12 PM    [facility]

## 2019-08-19 ENCOUNTER — Ambulatory Visit (INDEPENDENT_AMBULATORY_CARE_PROVIDER_SITE_OTHER): Payer: No Typology Code available for payment source | Admitting: Cardiology

## 2019-08-19 ENCOUNTER — Other Ambulatory Visit: Payer: Self-pay

## 2019-08-19 ENCOUNTER — Other Ambulatory Visit: Payer: Self-pay | Admitting: Cardiology

## 2019-08-19 ENCOUNTER — Encounter: Payer: Self-pay | Admitting: Cardiology

## 2019-08-19 VITALS — BP 128/72 | HR 75 | Ht 70.0 in | Wt 217.2 lb

## 2019-08-19 DIAGNOSIS — G4733 Obstructive sleep apnea (adult) (pediatric): Secondary | ICD-10-CM

## 2019-08-19 DIAGNOSIS — I10 Essential (primary) hypertension: Secondary | ICD-10-CM | POA: Diagnosis not present

## 2019-08-19 DIAGNOSIS — I2510 Atherosclerotic heart disease of native coronary artery without angina pectoris: Secondary | ICD-10-CM | POA: Diagnosis not present

## 2019-08-19 DIAGNOSIS — E785 Hyperlipidemia, unspecified: Secondary | ICD-10-CM | POA: Diagnosis not present

## 2019-08-19 DIAGNOSIS — I5042 Chronic combined systolic (congestive) and diastolic (congestive) heart failure: Secondary | ICD-10-CM

## 2019-08-19 MED ORDER — METOPROLOL SUCCINATE ER 100 MG PO TB24: 100.0000 mg | ORAL_TABLET | Freq: Every day | ORAL | 3 refills | Status: DC

## 2019-08-19 MED FILL — METOPROLOL SUCCINATE ER 100: 100 | 90 days supply | Qty: 90 | Fill #0

## 2019-08-19 NOTE — Patient Instructions (Signed)
Medication Instructions:  INCREASE metoprolol succinate (Toprol XL) to 100 mg daily  *If you need a refill on your cardiac medications before your next appointment, please call your pharmacy*  Follow-Up: At [facility], you and your health needs are our priority.  As part of our continuing mission to provide you with exceptional heart care, we have created designated Provider Care Teams.  These Care Teams include your primary Cardiologist (physician) and Advanced Practice Providers (APPs -  Physician Assistants and Nurse Practitioners) who all work together to provide you with the care you need, when you need it.  We recommend signing up for the patient portal called "MyChart".  Sign up information is provided on this After Visit Summary.  MyChart is used to connect with patients for Virtual Visits (Telemedicine).  Patients are able to view lab/test results, encounter notes, upcoming appointments, etc.  Non-urgent messages can be sent to your provider as well.   To learn more about what you can do with MyChart, go to [email or web address].    Your next appointment:   3 month(s)  The format for your next appointment:   In Person  Provider:   Oswaldo Milian, MD

## 2019-08-20 MED FILL — VIT D2 1.25 MG (50,000 UNIT: 1.25 MG | 28 days supply | Qty: 4 | Fill #0

## 2019-08-22 MED FILL — OZEMPIC 0.25 OR 0.5 MG/DOSE: 2 | 28 days supply | Qty: 2 | Fill #2

## 2019-08-29 ENCOUNTER — Other Ambulatory Visit: Payer: Self-pay

## 2019-08-29 ENCOUNTER — Ambulatory Visit (INDEPENDENT_AMBULATORY_CARE_PROVIDER_SITE_OTHER): Payer: No Typology Code available for payment source | Admitting: Family Medicine

## 2019-08-29 ENCOUNTER — Encounter (INDEPENDENT_AMBULATORY_CARE_PROVIDER_SITE_OTHER): Payer: Self-pay | Admitting: Family Medicine

## 2019-08-29 VITALS — BP 121/71 | HR 70 | Temp 97.9°F | Ht 70.0 in | Wt 209.0 lb

## 2019-08-29 DIAGNOSIS — E559 Vitamin D deficiency, unspecified: Secondary | ICD-10-CM

## 2019-08-29 DIAGNOSIS — E669 Obesity, unspecified: Secondary | ICD-10-CM | POA: Diagnosis not present

## 2019-08-29 DIAGNOSIS — E1165 Type 2 diabetes mellitus with hyperglycemia: Secondary | ICD-10-CM

## 2019-08-29 DIAGNOSIS — Z6831 Body mass index (BMI) 31.0-31.9, adult: Secondary | ICD-10-CM

## 2019-09-02 NOTE — Progress Notes (Signed)
Chief Complaint:   OBESITY Travis Owens is here to discuss his progress with his obesity treatment plan along with follow-up of his obesity related diagnoses. [name] is on the Category 4 Plan and states he is following his eating plan approximately 50% of the time. [name] states he is working in the yard for 80 minutes 3 times per week.  Today's visit was #: 8 Starting weight: 220 lbs Starting date: 08/29/2019 Today's weight: 209 lbs Today's date: 08/29/2019 Total lbs lost to date: 11 lbs Total lbs lost since last in-office visit: 0  Interim History: [name] has been working hard in the yard the last few weeks.  He reports that he is eating about the same.  Doing a protein shake for breakfast or oatmeal with blueberries or strawberries.  Lunch is [employer] or an [employer] sandwich with 1 slice of bread or around 2 ounces of chicken with some vegetables.  He is hoping to go to the mountains within the next few weeks.  Subjective:   1. Type 2 diabetes mellitus with hyperglycemia, without long-term current use of insulin ([name]) Blood sugar 116 this morning; 144 highest.  He is taking Jardiance, metformin, and Ozempic.  Lab Results  Component Value Date   HGBA1C 9.1 (A) 06/21/2019   HGBA1C 9.5 (H) 04/30/2019   HGBA1C 8.3 (A) 12/17/2018   Lab Results  Component Value Date   LDLCALC 75 04/30/2019   CREATININE 0.62 (L) 04/30/2019   Lab Results  Component Value Date   INSULIN 24.2 04/30/2019   2. Vitamin D deficiency Travis Owens's Vitamin D level was 25.8 on 04/30/2019. He is currently taking prescription vitamin D 50,000 IU each week. He denies nausea, vomiting or muscle weakness.  Assessment/Plan:   1. Type 2 diabetes mellitus with hyperglycemia, without long-term current use of insulin ([name]) Good blood sugar control is important to decrease the likelihood of diabetic complications such as nephropathy, neuropathy, limb loss, blindness, coronary artery disease, and death. Intensive  lifestyle modification including diet, exercise and weight loss are the first line of treatment for diabetes.  He will continue his current medications.  2. Vitamin D deficiency Low Vitamin D level contributes to fatigue and are associated with obesity, breast, and colon cancer. He agrees to continue to take prescription Vitamin D @50 ,000 IU every week and will follow-up for routine testing of Vitamin D, at least 2-3 times per year to avoid over-replacement.  3. Class 1 obesity with serious comorbidity and body mass index (BMI) of 31.0 to 31.9 in adult, unspecified obesity type Ranger is currently in the action stage of change. As such, his goal is to continue with weight loss efforts. He has agreed to practicing portion control and making smarter food choices, such as increasing vegetables and decreasing simple carbohydrates.   Exercise goals: Start walking for 10 minutes 3 times per week.  Behavioral modification strategies: increasing lean protein intake, increasing vegetables, decreasing eating out and meal planning and cooking strategies.  Travis Owens has agreed to follow-up with our clinic in 2 weeks. He was informed of the importance of frequent follow-up visits to maximize his success with intensive lifestyle modifications for his multiple health conditions.   Objective:   Blood pressure 121/71, pulse 70, temperature 97.9 F (36.6 C), temperature source Oral, height 5\' 10"  (1.778 m), weight 209 lb (94.8 kg), SpO2 96 %. Body mass index is 29.99 kg/m.  General: Cooperative, alert, well developed, in no acute distress. HEENT: Conjunctivae and lids unremarkable. Cardiovascular:  Regular rhythm.  Lungs: Normal work of breathing. Neurologic: No focal deficits.   Lab Results  Component Value Date   CREATININE 0.62 (L) 04/30/2019   BUN 13 04/30/2019   NA 138 04/30/2019   K 4.4 04/30/2019   CL 101 04/30/2019   CO2 20 04/30/2019   Lab Results  Component Value Date   ALT 30 04/30/2019   AST  26 04/30/2019   ALKPHOS 73 04/30/2019   BILITOT 0.3 04/30/2019   Lab Results  Component Value Date   HGBA1C 9.1 (A) 06/21/2019   HGBA1C 9.5 (H) 04/30/2019   HGBA1C 8.3 (A) 12/17/2018   HGBA1C 9.5 (H) 09/12/2018   HGBA1C 7.8 09/07/2017   Lab Results  Component Value Date   INSULIN 24.2 04/30/2019   Lab Results  Component Value Date   TSH 1.460 04/30/2019   Lab Results  Component Value Date   CHOL 163 04/30/2019   HDL 55 04/30/2019   LDLCALC 75 04/30/2019   TRIG 201 (H) 04/30/2019   CHOLHDL 2 09/12/2018   Lab Results  Component Value Date   WBC 8.3 04/30/2019   HGB 14.1 04/30/2019   HCT 42.1 04/30/2019   MCV 87 04/30/2019   PLT 273 04/30/2019   Attestation Statements:   Reviewed by clinician on day of visit: allergies, medications, problem list, medical history, surgical history, family history, social history, and previous encounter notes.  Time spent on visit including pre-visit chart review and post-visit care and charting was 15 minutes.   I, Water quality scientist, CMA, am acting as transcriptionist for Coralie Common, MD.  I have reviewed the above documentation for accuracy and completeness, and I agree with the above. - Jinny Blossom, MD

## 2019-09-13 ENCOUNTER — Ambulatory Visit (INDEPENDENT_AMBULATORY_CARE_PROVIDER_SITE_OTHER): Payer: No Typology Code available for payment source | Admitting: Family Medicine

## 2019-09-13 ENCOUNTER — Other Ambulatory Visit: Payer: Self-pay

## 2019-09-13 ENCOUNTER — Other Ambulatory Visit: Payer: Self-pay | Admitting: Family Medicine

## 2019-09-13 ENCOUNTER — Encounter: Payer: Self-pay | Admitting: Family Medicine

## 2019-09-13 VITALS — BP 108/64 | HR 73 | Temp 97.7°F | Ht 70.0 in | Wt 213.2 lb

## 2019-09-13 DIAGNOSIS — I152 Hypertension secondary to endocrine disorders: Secondary | ICD-10-CM

## 2019-09-13 DIAGNOSIS — E1165 Type 2 diabetes mellitus with hyperglycemia: Secondary | ICD-10-CM

## 2019-09-13 DIAGNOSIS — Z125 Encounter for screening for malignant neoplasm of prostate: Secondary | ICD-10-CM

## 2019-09-13 DIAGNOSIS — Z6830 Body mass index (BMI) 30.0-30.9, adult: Secondary | ICD-10-CM

## 2019-09-13 DIAGNOSIS — F325 Major depressive disorder, single episode, in full remission: Secondary | ICD-10-CM | POA: Diagnosis not present

## 2019-09-13 DIAGNOSIS — E118 Type 2 diabetes mellitus with unspecified complications: Secondary | ICD-10-CM | POA: Diagnosis not present

## 2019-09-13 DIAGNOSIS — E1169 Type 2 diabetes mellitus with other specified complication: Secondary | ICD-10-CM | POA: Diagnosis not present

## 2019-09-13 DIAGNOSIS — E1159 Type 2 diabetes mellitus with other circulatory complications: Secondary | ICD-10-CM

## 2019-09-13 DIAGNOSIS — E669 Obesity, unspecified: Secondary | ICD-10-CM

## 2019-09-13 DIAGNOSIS — E785 Hyperlipidemia, unspecified: Secondary | ICD-10-CM

## 2019-09-13 DIAGNOSIS — I10 Essential (primary) hypertension: Secondary | ICD-10-CM

## 2019-09-13 LAB — CBC
HCT: 42.7 % (ref 39.0–52.0)
Hemoglobin: 14.2 g/dL (ref 13.0–17.0)
MCHC: 33.3 g/dL (ref 30.0–36.0)
MCV: 90.2 fl (ref 78.0–100.0)
Platelets: 276 10*3/uL (ref 150.0–400.0)
RBC: 4.74 Mil/uL (ref 4.22–5.81)
RDW: 15 % (ref 11.5–15.5)
WBC: 8.7 10*3/uL (ref 4.0–10.5)

## 2019-09-13 LAB — POCT GLYCOSYLATED HEMOGLOBIN (HGB A1C): Hemoglobin A1C: 7.5 % — AB (ref 4.0–5.6)

## 2019-09-13 LAB — COMPREHENSIVE METABOLIC PANEL
ALT: 26 U/L (ref 0–53)
AST: 23 U/L (ref 0–37)
Albumin: 4.7 g/dL (ref 3.5–5.2)
Alkaline Phosphatase: 60 U/L (ref 39–117)
BUN: 17 mg/dL (ref 6–23)
CO2: 31 mEq/L (ref 19–32)
Calcium: 9.7 mg/dL (ref 8.4–10.5)
Chloride: 100 mEq/L (ref 96–112)
Creatinine, Ser: 0.82 mg/dL (ref 0.40–1.50)
GFR: 94.54 mL/min (ref >60.00)
Glucose, Bld: 141 mg/dL — ABNORMAL HIGH (ref 70–99)
Potassium: 4.5 mEq/L (ref 3.5–5.1)
Sodium: 136 mEq/L (ref 135–145)
Total Bilirubin: 0.6 mg/dL (ref 0.2–1.2)
Total Protein: 7.1 g/dL (ref 6.0–8.3)

## 2019-09-13 LAB — LIPID PANEL
Cholesterol: 139 mg/dL (ref 0–200)
HDL: 49.4 mg/dL (ref >39.00)
LDL Cholesterol: 57 mg/dL (ref 0–99)
NonHDL: 89.81
Total CHOL/HDL Ratio: 3
Triglycerides: 166 mg/dL — ABNORMAL HIGH (ref 0.0–149.0)
VLDL: 33.2 mg/dL (ref 0.0–40.0)

## 2019-09-13 LAB — TSH: TSH: 1.33 u[IU]/mL (ref 0.35–4.50)

## 2019-09-13 LAB — PSA: PSA: 1.26 ng/mL (ref 0.10–4.00)

## 2019-09-13 MED ORDER — EZETIMIBE 10 MG PO TABS: 10.0000 mg | ORAL_TABLET | Freq: Every day | ORAL | 3 refills | Status: DC

## 2019-09-13 MED ORDER — PANTOPRAZOLE SODIUM 40 MG PO TBEC: 40.0000 mg | DELAYED_RELEASE_TABLET | Freq: Every day | ORAL | 3 refills | Status: DC

## 2019-09-13 MED ORDER — LOSARTAN POTASSIUM 100 MG PO TABS: 100.0000 mg | ORAL_TABLET | Freq: Every day | ORAL | 3 refills | Status: DC

## 2019-09-13 MED ORDER — ESCITALOPRAM OXALATE 20 MG PO TABS: 20.0000 mg | ORAL_TABLET | Freq: Every day | ORAL | 3 refills | Status: DC

## 2019-09-13 MED ORDER — METFORMIN HCL 1000 MG PO TABS: 1000.0000 mg | ORAL_TABLET | Freq: Two times a day (BID) | ORAL | 3 refills | Status: DC

## 2019-09-13 NOTE — Assessment & Plan Note (Signed)
Doing well Lexapro 20 mg daily.

## 2019-09-13 NOTE — Assessment & Plan Note (Signed)
Check lipid panel, CBC, C met, TSH.  Continue atorvastatin 80 mg daily and Zetia 10 mg daily.

## 2019-09-13 NOTE — Progress Notes (Signed)
Chief Complaint:  Travis Owens is a 64 y.o. male who presents today for his annual comprehensive physical exam.    Assessment/Plan:  Chronic Problems Addressed Today: Depression, major, single episode, complete remission ([place]) Doing well Lexapro 20 mg daily.  Hyperlipidemia associated with type 2 diabetes mellitus ([name]) Check lipid panel, CBC, C met, TSH.  Continue atorvastatin 80 mg daily and Zetia 10 mg daily.  Type 2 diabetes mellitus with complication, without long-term current use of insulin ([name]) [name] 7.5.  Stop Jardiance.  Continue Ozempic 0.5 mg weekly and Metformin 1000 mg twice daily.  Follow-up in 3 to 6 months to recheck [name].  Hypertension associated with diabetes ([place]) Well-controlled.  Continue losartan 100 mg daily and metoprolol succinate 50 mg daily   Body mass index is 30.59 kg/m. / Obese BMI Metric Follow Up - 09/13/19 0909      BMI Metric Follow Up-Please document annually   BMI Metric Follow Up  Education provided        Preventative Healthcare: Check CBC, C met, TSH, PSA, lipid panel.  Patient Counseling(The following topics were reviewed and/or handout was given):  -Nutrition: Stressed importance of moderation in sodium/caffeine intake, saturated fat and cholesterol, caloric balance, sufficient intake of fresh fruits, vegetables, and fiber.  -Stressed the importance of regular exercise.   -Substance Abuse: Discussed cessation/primary prevention of tobacco, alcohol, or other drug use; driving or other dangerous activities under the influence; availability of treatment for abuse.   -Injury prevention: Discussed safety belts, safety helmets, smoke detector, smoking near bedding or upholstery.   -Sexuality: Discussed sexually transmitted diseases, partner selection, use of condoms, avoidance of unintended pregnancy and contraceptive alternatives.   -Dental health: Discussed importance of regular tooth brushing, flossing, and dental visits.  -Health  maintenance and immunizations reviewed. Please refer to Health maintenance section.  Return to care in 1 year for next preventative visit.     Subjective:  HPI:  He has no acute complaints today.   Lifestyle Diet: Making better food choices. Working with [employer] management clinic.  Exercise: Working in yard. Trying to walk more.   Depression screen PHQ 2/9 04/30/2019  Decreased Interest 2  Down, Depressed, Hopeless 1  PHQ - 2 Score 3  Altered sleeping 2  Tired, decreased energy 3  Change in appetite 2  Feeling bad or failure about yourself  0  Trouble concentrating 0  Moving slowly or fidgety/restless 0  Suicidal thoughts 0  PHQ-9 Score 10  Difficult doing work/chores Not difficult at all    Health Maintenance Due  Topic Date Due  . HIV Screening  Never done    ROS: Per HPI, otherwise a complete review of systems was negative.   PMH:  The following were reviewed and entered/updated in epic: Past Medical History:  Diagnosis Date  . Anxiety   . Chest pain   . Depression   . Diabetes mellitus without complication ([place])   . Fatty liver   . GERD (gastroesophageal reflux disease)   . Hyperlipidemia   . Hypertension   . Sleep apnea    has CPAP/does not use   Patient Active Problem List   Diagnosis Date Noted  . Hypertension associated with diabetes ([place]) 12/05/2016  . Type 2 diabetes mellitus with complication, without long-term current use of insulin ([place]) 12/05/2016  . Hyperlipidemia associated with type 2 diabetes mellitus ([place]) 12/05/2016  . Depression, major, single episode, complete remission ([place]) 12/05/2016   Past Surgical History:  Procedure Laterality Date  .  COLONOSCOPY     Dr. Olevia Perches  2008    Family History  Problem Relation Age of Onset  . Cancer Father        Lung Cancer  . Stroke Father   . Hypertension Paternal Grandmother   . Stroke Paternal Grandfather   . Colon cancer Neg Hx   . Colon polyps Neg Hx   . Esophageal cancer Neg Hx   .  Rectal cancer Neg Hx   . Stomach cancer Neg Hx     Medications- reviewed and updated Current Outpatient Medications  Medication Sig Dispense Refill  . aspirin EC 81 MG tablet Take 81 mg by mouth daily.    Marland Kitchen atorvastatin (LIPITOR) 80 MG tablet Take 1 tablet (80 mg total) by mouth daily at 6 PM. 90 tablet 3  . Blood Glucose Monitoring Suppl (FREESTYLE LITE) DEVI Check Blood sugar twice daily 1 Device 0  . escitalopram (LEXAPRO) 20 MG tablet Take 1 tablet (20 mg total) by mouth daily. 90 tablet 3  . ezetimibe (ZETIA) 10 MG tablet Take 1 tablet (10 mg total) by mouth daily. 90 tablet 3  . glucose blood (FREESTYLE LITE) test strip Check Blood sugar twice daily 100 each 12  . Lancets (FREESTYLE) lancets Check blood sugar twice daily 100 each 12  . losartan (COZAAR) 100 MG tablet Take 1 tablet (100 mg total) by mouth daily. 90 tablet 3  . metFORMIN (GLUCOPHAGE) 1000 MG tablet Take 1 tablet (1,000 mg total) by mouth 2 (two) times daily. 180 tablet 3  . metoprolol succinate (TOPROL XL) 100 MG 24 hr tablet Take 1 tablet (100 mg total) by mouth daily. 90 tablet 3  . Multiple Vitamin (MULTIVITAMIN) tablet Take 1 tablet by mouth daily.    . pantoprazole (PROTONIX) 40 MG tablet Take 1 tablet (40 mg total) by mouth daily. 90 tablet 3  . Semaglutide,0.25 or 0.5MG/DOS, (OZEMPIC, 0.25 OR 0.5 MG/DOSE,) 2 MG/1.5ML SOPN Inject 0.25-0.5 mg into the skin once a week. 1 pen 5  . Vitamin D, Ergocalciferol, (DRISDOL) 1.25 MG (50000 UNIT) CAPS capsule Take 1 capsule (50,000 Units total) by mouth every 7 (seven) days. 4 capsule 0   No current facility-administered medications for this visit.    Allergies-reviewed and updated No Known Allergies  Social History   Socioeconomic History  . Marital status: Married    Spouse name: Johncharles Fusselman  . Number of children: Not on file  . Years of education: Not on file  . Highest education level: Not on file  Occupational History  . Occupation: Theme park manager    Comment: [employer]  Tobacco Use  . Smoking status: Never Smoker  . Smokeless tobacco: Never Used  Substance and Sexual Activity  . Alcohol use: No  . Drug use: No  . Sexual activity: Yes  Other Topics Concern  . Not on file  Social History Narrative  . Not on file   Social Determinants of Health   Financial Resource Strain:   . Difficulty of Paying Living Expenses:   Food Insecurity:   . Worried About Charity fundraiser in the Last Year:   . Arboriculturist in the Last Year:   Transportation Needs:   . Film/video editor (Medical):   Marland Kitchen Lack of Transportation (Non-Medical):   Physical Activity:   . Days of Exercise per Week:   . Minutes of Exercise per Session:   Stress:   . Feeling of Stress :   Social Connections:   .  Frequency of Communication with Friends and Family:   . Frequency of Social Gatherings with Friends and Family:   . Attends Religious Services:   . Active Member of Clubs or Organizations:   . Attends Archivist Meetings:   Marland Kitchen Marital Status:         Objective:  Physical Exam: BP 108/64 (BP Location: Left Arm, Patient Position: Sitting, Cuff Size: Large)   Pulse 73   Temp 97.7 F (36.5 C) (Temporal)   Ht '5\' 10"'  (1.778 m)   Wt 213 lb 3.2 oz (96.7 kg)   SpO2 97%   BMI 30.59 kg/m   Body mass index is 30.59 kg/m. Wt Readings from Last 3 Encounters:  09/13/19 213 lb 3.2 oz (96.7 kg)  08/29/19 209 lb (94.8 kg)  08/19/19 217 lb 3.2 oz (98.5 kg)  Gen: NAD, resting comfortably HEENT: TMs normal bilaterally. OP clear. No thyromegaly noted.  CV: RRR with no murmurs appreciated Pulm: NWOB, CTAB with no crackles, wheezes, or rhonchi GI: Normal bowel sounds present. Soft, Nontender, Nondistended. MSK: no edema, cyanosis, or clubbing noted Skin: warm, dry Neuro: CN2-12 grossly intact. Strength 5/5 in upper and lower extremities. Reflexes symmetric and intact bilaterally.  Psych: Normal affect and thought content     Lianna Sitzmann M. [name],  MD 09/13/2019 9:48 AM

## 2019-09-13 NOTE — Assessment & Plan Note (Signed)
Well-controlled.  Continue losartan 100 mg daily and metoprolol succinate 50 mg daily

## 2019-09-13 NOTE — Patient Instructions (Signed)
It was very nice to see you today!  Your [name] is 7.5.  Please stop the Jardiance.  Keep up the good work with your diet and exercise.  We will check blood work today.  Please talk to your insurance company about shingles vaccine.  Please come back in 6 months to recheck your [name], or sooner if needed.  Take care, Dr [name]  Please try these tips to maintain a healthy lifestyle:   Eat at least 3 REAL meals and 1-2 snacks per day.  Aim for no more than 5 hours between eating.  If you eat breakfast, please do so within one hour of getting up.    Each meal should contain half fruits/vegetables, one quarter protein, and one quarter carbs (no bigger than a computer mouse)   Cut down on sweet beverages. This includes juice, soda, and sweet tea.     Drink at least 1 glass of water with each meal and aim for at least 8 glasses per day   Exercise at least 150 minutes every week.    Preventive Care 64-52 Years Old, Male Preventive care refers to lifestyle choices and visits with your health care provider that can promote health and wellness. This includes:  A yearly physical exam. This is also called an annual well check.  Regular dental and eye exams.  Immunizations.  Screening for certain conditions.  Healthy lifestyle choices, such as eating a healthy diet, getting regular exercise, not using drugs or products that contain nicotine and tobacco, and limiting alcohol use. What can I expect for my preventive care visit? Physical exam Your health care provider will check:  Height and weight. These may be used to calculate body mass index (BMI), which is a measurement that tells if you are at a healthy weight.  Heart rate and blood pressure.  Your skin for abnormal spots. Counseling Your health care provider may ask you questions about:  Alcohol, tobacco, and drug use.  Emotional well-being.  Home and relationship well-being.  Sexual activity.  Eating  habits.  Work and work Statistician. What immunizations do I need?  Influenza (flu) vaccine  This is recommended every year. Tetanus, diphtheria, and pertussis (Tdap) vaccine  You may need a Td booster every 10 years. Varicella (chickenpox) vaccine  You may need this vaccine if you have not already been vaccinated. Zoster (shingles) vaccine  You may need this after age 58. Measles, mumps, and rubella (MMR) vaccine  You may need at least one dose of MMR if you were born in 1957 or later. You may also need a second dose. Pneumococcal conjugate (PCV13) vaccine  You may need this if you have certain conditions and were not previously vaccinated. Pneumococcal polysaccharide (PPSV23) vaccine  You may need one or two doses if you smoke cigarettes or if you have certain conditions. Meningococcal conjugate (MenACWY) vaccine  You may need this if you have certain conditions. Hepatitis A vaccine  You may need this if you have certain conditions or if you travel or work in places where you may be exposed to hepatitis A. Hepatitis B vaccine  You may need this if you have certain conditions or if you travel or work in places where you may be exposed to hepatitis B. Haemophilus influenzae type b (Hib) vaccine  You may need this if you have certain risk factors. Human papillomavirus (HPV) vaccine  If recommended by your health care provider, you may need three doses over 6 months. You may receive vaccines as  individual doses or as more than one vaccine together in one shot (combination vaccines). Talk with your health care provider about the risks and benefits of combination vaccines. What tests do I need? Blood tests  Lipid and cholesterol levels. These may be checked every 5 years, or more frequently if you are over 9 years old.  Hepatitis C test.  Hepatitis B test. Screening  Lung cancer screening. You may have this screening every year starting at age 84 if you have a  30-pack-year history of smoking and currently smoke or have quit within the past 15 years.  Prostate cancer screening. Recommendations will vary depending on your family history and other risks.  Colorectal cancer screening. All adults should have this screening starting at age 44 and continuing until age 2. Your health care provider may recommend screening at age 83 if you are at increased risk. You will have tests every 1-10 years, depending on your results and the type of screening test.  Diabetes screening. This is done by checking your blood sugar (glucose) after you have not eaten for a while (fasting). You may have this done every 1-3 years.  Sexually transmitted disease (STD) testing. Follow these instructions at home: Eating and drinking  Eat a diet that includes fresh fruits and vegetables, whole grains, lean protein, and low-fat dairy products.  Take vitamin and mineral supplements as recommended by your health care provider.  Do not drink alcohol if your health care provider tells you not to drink.  If you drink alcohol: ? Limit how much you have to 0-2 drinks a day. ? Be aware of how much alcohol is in your drink. In the [place] one drink equals one 12 oz bottle of beer (355 mL), one 5 oz glass of wine (148 mL), or one 1 oz glass of hard liquor (44 mL). Lifestyle  Take daily care of your teeth and gums.  Stay active. Exercise for at least 30 minutes on 5 or more days each week.  Do not use any products that contain nicotine or tobacco, such as cigarettes, e-cigarettes, and chewing tobacco. If you need help quitting, ask your health care provider.  If you are sexually active, practice safe sex. Use a condom or other form of protection to prevent STIs (sexually transmitted infections).  Talk with your health care provider about taking a low-dose aspirin every day starting at age 48. What's next?  Go to your health care provider once a year for a well check visit.  Ask  your health care provider how often you should have your eyes and teeth checked.  Stay up to date on all vaccines. This information is not intended to replace advice given to you by your health care provider. Make sure you discuss any questions you have with your health care provider. Document Revised: 04/05/2018 Document Reviewed: 04/05/2018 Elsevier Patient Education  2020 [employer].

## 2019-09-13 NOTE — Assessment & Plan Note (Signed)
[name] 7.5.  Stop Jardiance.  Continue Ozempic 0.5 mg weekly and Metformin 1000 mg twice daily.  Follow-up in 3 to 6 months to recheck [name].

## 2019-09-16 NOTE — Progress Notes (Signed)
Please inform patient of the following:  Blood work is all STABLE. Do not need to make any further adjustments at this time. Recommend that he continue to work on diet and exercise and we can recheck in a year. We will see him back in 6 months to recheck his [name].

## 2019-09-17 ENCOUNTER — Encounter (INDEPENDENT_AMBULATORY_CARE_PROVIDER_SITE_OTHER): Payer: Self-pay | Admitting: Family Medicine

## 2019-09-17 ENCOUNTER — Ambulatory Visit (INDEPENDENT_AMBULATORY_CARE_PROVIDER_SITE_OTHER): Payer: No Typology Code available for payment source | Admitting: Family Medicine

## 2019-09-17 ENCOUNTER — Other Ambulatory Visit: Payer: Self-pay

## 2019-09-17 VITALS — BP 115/65 | HR 77 | Temp 98.4°F | Ht 70.0 in | Wt 209.0 lb

## 2019-09-17 DIAGNOSIS — E559 Vitamin D deficiency, unspecified: Secondary | ICD-10-CM

## 2019-09-17 DIAGNOSIS — E1165 Type 2 diabetes mellitus with hyperglycemia: Secondary | ICD-10-CM

## 2019-09-17 DIAGNOSIS — Z6830 Body mass index (BMI) 30.0-30.9, adult: Secondary | ICD-10-CM | POA: Diagnosis not present

## 2019-09-17 DIAGNOSIS — E669 Obesity, unspecified: Secondary | ICD-10-CM | POA: Diagnosis not present

## 2019-09-17 MED FILL — OZEMPIC 0.25 OR 0.5 MG/DOSE: 2 | 28 days supply | Qty: 2 | Fill #3

## 2019-09-17 NOTE — Progress Notes (Signed)
Chief Complaint:   OBESITY Travis Owens is here to discuss his progress with his obesity treatment plan along with follow-up of his obesity related diagnoses. Travis Owens is practicing portion control and making smarter food choices, such as increasing vegetables and decreasing simple carbohydrates and states he is following his eating plan approximately 50% of the time. Travis Owens states he is working in the yard.   Today's visit was #: 9 Starting weight: 220 lbs Starting date: 04/30/2019 Today's weight: 209 lbs Today's date: 09/17/2019 Total lbs lost to date: 11 Total lbs lost since last in-office visit: 0  Interim History: Travis Owens is still following the plan 50%; some days he is more on the plan than others. He loves oatmeal and is putting blueberries in it. He is doing [employer] and [employer] occasionally. Travis Owens has a Travis Owens interpreter coming to stay with him in the upcoming weeks and [place].  Subjective:   Type 2 diabetes mellitus with hyperglycemia, without long-term current use of insulin ([place]). Fasting blood sugars range between 100 and 130's. Travis Owens is on Ozempic and metformin. [name] at Dr. Marigene Ehlers office was 7.5 (significantly decreased from 9.1 in February 2021).  Lab Results  Component Value Date   HGBA1C 7.5 (A) 09/13/2019   HGBA1C 9.1 (A) 06/21/2019   HGBA1C 9.5 (H) 04/30/2019   Lab Results  Component Value Date   LDLCALC 57 09/13/2019   CREATININE 0.82 09/13/2019   Lab Results  Component Value Date   INSULIN 24.2 04/30/2019   Vitamin D deficiency. No nausea, vomiting, or muscle weakness. Travis Owens endorses fatigue. He is on prescription Vitamin D supplementation. Last Vitamin D 25.8 on 04/30/2019.  Assessment/Plan:   Type 2 diabetes mellitus with hyperglycemia, without long-term current use of insulin (Travis Owens). Good blood sugar control is important to decrease the likelihood of diabetic complications such as nephropathy, neuropathy, limb  loss, blindness, coronary artery disease, and death. Intensive lifestyle modification including diet, exercise and weight loss are the first line of treatment for diabetes. Travis Owens will have follow-up labs in 3 months.  Vitamin D deficiency. Low Vitamin D level contributes to fatigue and are associated with obesity, breast, and colon cancer. He agrees to continue to take prescription Vitamin D (no refill needed) and will follow-up for routine testing of Vitamin D, at least 2-3 times per year to avoid over-replacement.  Class 1 obesity with serious comorbidity and body mass index (BMI) of 30.0 to 30.9 in adult, unspecified obesity type.  Travis Owens is currently in the action stage of change. As such, his goal is to continue with weight loss efforts. He has agreed to practicing portion control and making smarter food choices, such as increasing vegetables and decreasing simple carbohydrates.   Exercise goals: Travis Owens will continue his current exercise regimen.  Behavioral modification strategies: increasing lean protein intake, meal planning and cooking strategies, travel eating strategies and avoiding temptations.  Travis Owens has agreed to follow-up with our clinic in 4 weeks. He was informed of the importance of frequent follow-up visits to maximize his success with intensive lifestyle modifications for his multiple health conditions.   Objective:   Blood pressure 115/65, pulse 77, temperature 98.4 F (36.9 C), temperature source Oral, height 5\' 10"  (1.778 m), weight 209 lb (94.8 kg), SpO2 96 %. Body mass index is 29.99 kg/m.  General: Cooperative, alert, well developed, in no acute distress. HEENT: Conjunctivae and lids unremarkable. Cardiovascular: Regular rhythm.  Lungs: Normal work of breathing. Neurologic:  No focal deficits.   Lab Results  Component Value Date   CREATININE 0.82 09/13/2019   BUN 17 09/13/2019   NA 136 09/13/2019   K 4.5 09/13/2019   CL 100 09/13/2019   CO2 31 09/13/2019   Lab  Results  Component Value Date   ALT 26 09/13/2019   AST 23 09/13/2019   ALKPHOS 60 09/13/2019   BILITOT 0.6 09/13/2019   Lab Results  Component Value Date   HGBA1C 7.5 (A) 09/13/2019   HGBA1C 9.1 (A) 06/21/2019   HGBA1C 9.5 (H) 04/30/2019   HGBA1C 8.3 (A) 12/17/2018   HGBA1C 9.5 (H) 09/12/2018   Lab Results  Component Value Date   INSULIN 24.2 04/30/2019   Lab Results  Component Value Date   TSH 1.33 09/13/2019   Lab Results  Component Value Date   CHOL 139 09/13/2019   HDL 49.40 09/13/2019   LDLCALC 57 09/13/2019   TRIG 166.0 (H) 09/13/2019   CHOLHDL 3 09/13/2019   Lab Results  Component Value Date   WBC 8.7 09/13/2019   HGB 14.2 09/13/2019   HCT 42.7 09/13/2019   MCV 90.2 09/13/2019   PLT 276.0 09/13/2019   No results found for: IRON, TIBC, FERRITIN  Attestation Statements:   Reviewed by clinician on day of visit: allergies, medications, problem list, medical history, surgical history, family history, social history, and previous encounter notes.  Time spent on visit including pre-visit chart review and post-visit charting and care was 15 minutes.   I, Travis Owens, am acting as transcriptionist for Travis Common, MD   I have reviewed the above documentation for accuracy and completeness, and I agree with the above. - Travis Blossom, MD

## 2019-09-19 MED FILL — EZETIMIBE 10 MG TABS: 10 | 90 days supply | Qty: 90 | Fill #0

## 2019-09-19 MED FILL — PANTOPRAZOLE SOD DR 40 MG T: 40 | 90 days supply | Qty: 90 | Fill #0

## 2019-09-19 MED FILL — metFORMIN HCL 1000 MG TABS: 1000 | 90 days supply | Qty: 180 | Fill #0

## 2019-09-19 MED FILL — ESCITALOPRAM 20 MG TABLET: 20 | 90 days supply | Qty: 90 | Fill #0

## 2019-09-26 MED FILL — LOSARTAN POTASSIUM 100 MG T: 100 | 90 days supply | Qty: 90 | Fill #0

## 2019-10-09 MED FILL — metFORMIN HCL 1000 MG TABS: 1000 | 90 days supply | Qty: 180 | Fill #0

## 2019-10-09 MED FILL — ESCITALOPRAM 20 MG TABLET: 20 | 90 days supply | Qty: 90 | Fill #0

## 2019-10-09 MED FILL — PANTOPRAZOLE SOD DR 40 MG T: 40 | 90 days supply | Qty: 90 | Fill #0

## 2019-10-09 MED FILL — EZETIMIBE 10 MG TABS: 10 | 90 days supply | Qty: 90 | Fill #0

## 2019-10-15 ENCOUNTER — Encounter (INDEPENDENT_AMBULATORY_CARE_PROVIDER_SITE_OTHER): Payer: Self-pay | Admitting: Family Medicine

## 2019-10-15 ENCOUNTER — Other Ambulatory Visit: Payer: Self-pay

## 2019-10-15 ENCOUNTER — Ambulatory Visit (INDEPENDENT_AMBULATORY_CARE_PROVIDER_SITE_OTHER): Payer: No Typology Code available for payment source | Admitting: Family Medicine

## 2019-10-15 VITALS — BP 100/67 | HR 85 | Temp 98.2°F | Ht 70.0 in | Wt 209.0 lb

## 2019-10-15 DIAGNOSIS — E559 Vitamin D deficiency, unspecified: Secondary | ICD-10-CM

## 2019-10-15 DIAGNOSIS — E1165 Type 2 diabetes mellitus with hyperglycemia: Secondary | ICD-10-CM

## 2019-10-15 DIAGNOSIS — Z6830 Body mass index (BMI) 30.0-30.9, adult: Secondary | ICD-10-CM

## 2019-10-15 DIAGNOSIS — E669 Obesity, unspecified: Secondary | ICD-10-CM | POA: Diagnosis not present

## 2019-10-16 NOTE — Progress Notes (Signed)
Chief Complaint:   OBESITY Travis Owens is here to discuss his progress with his obesity treatment plan along with follow-up of his obesity related diagnoses. Travis Owens is on practicing portion control and making smarter food choices, such as increasing vegetables and decreasing simple carbohydrates and states he is following his eating plan approximately 50% of the time. Travis Owens states he is working in the yard.  Today's visit was #: 10 Starting weight: 220 lbs Starting date: 04/30/2019 Today's weight: 209 lbs Today's date: 10/15/2019 Total lbs lost to date: 11 lbs Total lbs lost since last in-office visit: 0  Interim History: Travis Owens says he has had [place] friends visiting, so [place] and has been traveling quite a bit.  He reports that he ate fairly nutritious food; lots of fruits and vegetables.  Subjective:   1. Type 2 diabetes mellitus with hyperglycemia, without long-term current use of insulin ([name]) [name] has been checking his blood sugars.  He says he has indulged in some sweets such as ice cream.  He is taking metformin and Ozempic.  Last [name] is improved to 7.5 from 9.1.  Lab Results  Component Value Date   HGBA1C 7.5 (A) 09/13/2019   HGBA1C 9.1 (A) 06/21/2019   HGBA1C 9.5 (H) 04/30/2019   Lab Results  Component Value Date   LDLCALC 57 09/13/2019   CREATININE 0.82 09/13/2019   Lab Results  Component Value Date   INSULIN 24.2 04/30/2019   2. Vitamin D deficiency Travis Owens's Vitamin D level was 25.8 on 04/30/2019. He is currently taking prescription vitamin D 50,000 IU each week. He denies nausea, vomiting or muscle weakness.  He endorses fatigue.  Assessment/Plan:   1. Type 2 diabetes mellitus with hyperglycemia, without long-term current use of insulin ([name]) Good blood sugar control is important to decrease the likelihood of diabetic complications such as nephropathy, neuropathy, limb loss, blindness, coronary artery disease, and death. Intensive  lifestyle modification including diet, exercise and weight loss are the first line of treatment for diabetes.   2. Vitamin D deficiency Low Vitamin D level contributes to fatigue and are associated with obesity, breast, and colon cancer. He agrees to continue to take prescription Vitamin D @50 ,000 IU every week and will follow-up for routine testing of Vitamin D, at least 2-3 times per year to avoid over-replacement.  3. Class 1 obesity with serious comorbidity and body mass index (BMI) of 30.0 to 30.9 in adult, unspecified obesity type Travis Owens is currently in the action stage of change. As such, his goal is to continue with weight loss efforts. He has agreed to practicing portion control and making smarter food choices, such as increasing vegetables and decreasing simple carbohydrates.   Exercise goals: Travis Owens will start walking for 15 minutes 3 times a week.  Behavioral modification strategies: increasing lean protein intake, meal planning and cooking strategies, keeping healthy foods in the home and planning for success.  Travis Owens has agreed to follow-up with our clinic in 2 weeks. He was informed of the importance of frequent follow-up visits to maximize his success with intensive lifestyle modifications for his multiple health conditions.   Objective:   Blood pressure 100/67, pulse 85, temperature 98.2 F (36.8 C), temperature source Oral, height 5\' 10"  (1.778 m), weight 209 lb (94.8 kg), SpO2 97 %. Body mass index is 29.99 kg/m.  General: Cooperative, alert, well developed, in no acute distress. HEENT: Conjunctivae and lids unremarkable. Cardiovascular: Regular rhythm.  Lungs: Normal work of breathing.  Neurologic: No focal deficits.   Lab Results  Component Value Date   CREATININE 0.82 09/13/2019   BUN 17 09/13/2019   NA 136 09/13/2019   K 4.5 09/13/2019   CL 100 09/13/2019   CO2 31 09/13/2019   Lab Results  Component Value Date   ALT 26 09/13/2019   AST 23 09/13/2019   ALKPHOS  60 09/13/2019   BILITOT 0.6 09/13/2019   Lab Results  Component Value Date   HGBA1C 7.5 (A) 09/13/2019   HGBA1C 9.1 (A) 06/21/2019   HGBA1C 9.5 (H) 04/30/2019   HGBA1C 8.3 (A) 12/17/2018   HGBA1C 9.5 (H) 09/12/2018   Lab Results  Component Value Date   INSULIN 24.2 04/30/2019   Lab Results  Component Value Date   TSH 1.33 09/13/2019   Lab Results  Component Value Date   CHOL 139 09/13/2019   HDL 49.40 09/13/2019   LDLCALC 57 09/13/2019   TRIG 166.0 (H) 09/13/2019   CHOLHDL 3 09/13/2019   Lab Results  Component Value Date   WBC 8.7 09/13/2019   HGB 14.2 09/13/2019   HCT 42.7 09/13/2019   MCV 90.2 09/13/2019   PLT 276.0 09/13/2019   Attestation Statements:   Reviewed by clinician on day of visit: allergies, medications, problem list, medical history, surgical history, family history, social history, and previous encounter notes.  Time spent on visit including pre-visit chart review and post-visit care and charting was 15 minutes.   I, Water quality scientist, CMA, am acting as transcriptionist for Coralie Common, MD.  I have reviewed the above documentation for accuracy and completeness, and I agree with the above. - Jinny Blossom, MD

## 2019-10-18 MED FILL — OZEMPIC 0.25 OR 0.5 MG/DOSE: 2 | 28 days supply | Qty: 2 | Fill #4

## 2019-10-24 ENCOUNTER — Encounter (INDEPENDENT_AMBULATORY_CARE_PROVIDER_SITE_OTHER): Payer: Self-pay

## 2019-10-29 ENCOUNTER — Ambulatory Visit (INDEPENDENT_AMBULATORY_CARE_PROVIDER_SITE_OTHER): Payer: No Typology Code available for payment source | Admitting: Family Medicine

## 2019-11-07 ENCOUNTER — Other Ambulatory Visit: Payer: Self-pay

## 2019-11-07 ENCOUNTER — Ambulatory Visit (INDEPENDENT_AMBULATORY_CARE_PROVIDER_SITE_OTHER): Payer: No Typology Code available for payment source | Admitting: Family Medicine

## 2019-11-07 ENCOUNTER — Encounter (INDEPENDENT_AMBULATORY_CARE_PROVIDER_SITE_OTHER): Payer: Self-pay | Admitting: Family Medicine

## 2019-11-07 VITALS — BP 92/53 | HR 75 | Temp 98.2°F | Ht 70.0 in | Wt 209.0 lb

## 2019-11-07 DIAGNOSIS — E1165 Type 2 diabetes mellitus with hyperglycemia: Secondary | ICD-10-CM

## 2019-11-07 DIAGNOSIS — I10 Essential (primary) hypertension: Secondary | ICD-10-CM

## 2019-11-07 DIAGNOSIS — F3289 Other specified depressive episodes: Secondary | ICD-10-CM

## 2019-11-07 DIAGNOSIS — E669 Obesity, unspecified: Secondary | ICD-10-CM

## 2019-11-07 DIAGNOSIS — Z6830 Body mass index (BMI) 30.0-30.9, adult: Secondary | ICD-10-CM

## 2019-11-07 MED ORDER — LOSARTAN POTASSIUM 100 MG PO TABS: 50.0000 mg | ORAL_TABLET | Freq: Every day | ORAL | 0 refills | Status: DC

## 2019-11-07 MED FILL — LOSARTAN POTASSIUM 100 MG T: 100 | 90 days supply | Qty: 90 | Fill #0

## 2019-11-12 NOTE — Progress Notes (Signed)
Chief Complaint:   OBESITY Travis Owens is here to discuss his progress with his obesity treatment plan along with follow-up of his obesity related diagnoses. Travis Owens is on practicing portion control and making smarter food choices, such as increasing vegetables and decreasing simple carbohydrates and states he is following his eating plan approximately 50% of the time. [name] states he is doing 0 minutes 0 times per week.  Today's visit was #: 11 Starting weight: 220 lbs Starting date: 04/30/2019 Today's weight: 209 lbs Today's date: 11/07/2019 Total lbs lost to date: 11 Total lbs lost since last in-office visit: 0  Interim History: Travis Owens has had a week off over the last few weeks. He did get a chance to go [place]. Breakfast is cinnamon oatmeal or eggs (2) with grits or fries. Lunch is microwaveable meal (today he had a [employer] that was 540 calories). Dinner is [employer] roast beef sandwich with 1 slice of bread. He has had some ice cream and cookies.  Subjective:   1. Type 2 diabetes mellitus with hyperglycemia, without long-term current use of insulin ([name]) Tullio denies hypoglycemia, and the highest BGs is 158 (fasting) and lowest fasting BGs in the 140's. He is on Ozempic and metformin.  2. Essential hypertension [name]'s blood pressure is low today, and he denies dizziness or lightheadedness. He is on Cozaaer 100 mg daily.  3. Other depression, with emotional eating [name] denies suicidal ideas or homicidal ideas. His medications seems less effective currently.  Assessment/Plan:   1. Type 2 diabetes mellitus with hyperglycemia, without long-term current use of insulin ([name]) Good blood sugar control is important to decrease the likelihood of diabetic complications such as nephropathy, neuropathy, limb loss, blindness, coronary artery disease, and death. Intensive lifestyle modification including diet, exercise and weight loss are the first line of treatment for diabetes.  Travis Owens will continue his current medications, no change in dose.  2. Essential hypertension Travis Owens is working on healthy weight loss and exercise to improve blood pressure control. We will watch for signs of hypotension as he continues his lifestyle modifications. Travis Owens agreed to decrease Cozaar to 50 mg (he is to cut his medications in half).  3. Other depression, with emotional eating Behavior modification techniques were discussed today to help Travis Owens deal with his emotional/non-hunger eating behaviors. Travis Owens is to start walking and we will follow up at his next appointment. Orders and follow up as documented in patient record.   4. Class 1 obesity with serious comorbidity and body mass index (BMI) of 30.0 to 30.9 in adult, unspecified obesity type Travis Owens is currently in the action stage of change. As such, his goal is to continue with weight loss efforts. He has agreed to practicing portion control and making smarter food choices, such as increasing vegetables and decreasing simple carbohydrates.   Exercise goals: Travis Owens is to start walking for 10-15 minutes 2-3 times per week.  Behavioral modification strategies: increasing lean protein intake, meal planning and cooking strategies, keeping healthy foods in the home and planning for success.  Travis Owens has agreed to follow-up with our clinic in 2 to 3 weeks. He was informed of the importance of frequent follow-up visits to maximize his success with intensive lifestyle modifications for his multiple health conditions.   Objective:   Blood pressure (!) 92/53, pulse 75, temperature 98.2 F (36.8 C), temperature source Oral, height 5\' 10"  (1.778 m), weight 209 lb (94.8 kg), SpO2 96 %. Body mass index is 29.99 kg/m.  General: Cooperative, alert, well developed, in no acute distress. HEENT: Conjunctivae and lids unremarkable. Cardiovascular: Regular rhythm.  Lungs: Normal work of breathing. Neurologic: No focal deficits.   Lab Results  Component Value  Date   CREATININE 0.82 09/13/2019   BUN 17 09/13/2019   NA 136 09/13/2019   K 4.5 09/13/2019   CL 100 09/13/2019   CO2 31 09/13/2019   Lab Results  Component Value Date   ALT 26 09/13/2019   AST 23 09/13/2019   ALKPHOS 60 09/13/2019   BILITOT 0.6 09/13/2019   Lab Results  Component Value Date   HGBA1C 7.5 (A) 09/13/2019   HGBA1C 9.1 (A) 06/21/2019   HGBA1C 9.5 (H) 04/30/2019   HGBA1C 8.3 (A) 12/17/2018   HGBA1C 9.5 (H) 09/12/2018   Lab Results  Component Value Date   INSULIN 24.2 04/30/2019   Lab Results  Component Value Date   TSH 1.33 09/13/2019   Lab Results  Component Value Date   CHOL 139 09/13/2019   HDL 49.40 09/13/2019   LDLCALC 57 09/13/2019   TRIG 166.0 (H) 09/13/2019   CHOLHDL 3 09/13/2019   Lab Results  Component Value Date   WBC 8.7 09/13/2019   HGB 14.2 09/13/2019   HCT 42.7 09/13/2019   MCV 90.2 09/13/2019   PLT 276.0 09/13/2019   No results found for: IRON, TIBC, FERRITIN  Attestation Statements:   Reviewed by clinician on day of visit: allergies, medications, problem list, medical history, surgical history, family history, social history, and previous encounter notes.  Time spent on visit including pre-visit chart review and post-visit care and charting was 15 minutes.    I, Trixie Dredge, am acting as transcriptionist for Coralie Common, MD.  I have reviewed the above documentation for accuracy and completeness, and I agree with the above. - Jinny Blossom, MD

## 2019-11-19 ENCOUNTER — Ambulatory Visit: Payer: No Typology Code available for payment source | Admitting: Cardiology

## 2019-11-22 MED FILL — OZEMPIC 0.25 OR 0.5 MG/DOSE: 2 | 28 days supply | Qty: 2 | Fill #5

## 2019-11-26 ENCOUNTER — Ambulatory Visit (INDEPENDENT_AMBULATORY_CARE_PROVIDER_SITE_OTHER): Payer: No Typology Code available for payment source | Admitting: Family Medicine

## 2019-12-02 ENCOUNTER — Other Ambulatory Visit: Payer: Self-pay

## 2019-12-02 ENCOUNTER — Ambulatory Visit (INDEPENDENT_AMBULATORY_CARE_PROVIDER_SITE_OTHER): Payer: No Typology Code available for payment source | Admitting: Family Medicine

## 2019-12-02 ENCOUNTER — Encounter (INDEPENDENT_AMBULATORY_CARE_PROVIDER_SITE_OTHER): Payer: Self-pay | Admitting: Family Medicine

## 2019-12-02 VITALS — BP 99/61 | HR 78 | Temp 97.5°F | Ht 70.0 in | Wt 213.0 lb

## 2019-12-02 DIAGNOSIS — Z6830 Body mass index (BMI) 30.0-30.9, adult: Secondary | ICD-10-CM | POA: Diagnosis not present

## 2019-12-02 DIAGNOSIS — E669 Obesity, unspecified: Secondary | ICD-10-CM | POA: Diagnosis not present

## 2019-12-02 DIAGNOSIS — G4733 Obstructive sleep apnea (adult) (pediatric): Secondary | ICD-10-CM

## 2019-12-02 DIAGNOSIS — E1165 Type 2 diabetes mellitus with hyperglycemia: Secondary | ICD-10-CM | POA: Diagnosis not present

## 2019-12-03 NOTE — Progress Notes (Signed)
Chief Complaint:   OBESITY Travis Owens is here to discuss his progress with his obesity treatment plan along with follow-up of his obesity related diagnoses. Travis Owens is on practicing portion control and making smarter food choices, such as increasing vegetables and decreasing simple carbohydrates and states he is following his eating plan approximately 25% of the time. Travis Owens states he is is active while doing yard work.  Today's visit was #: 12 Starting weight: 220 lbs Starting date: 04/30/2019 Today's weight: 213 lbs Today's date: 12/02/2019 Total lbs lost to date: 7 Total lbs lost since last in-office visit: 0  Interim History: Travis Owens feels he has only been able to be compliant for about 25% on the meal plan secondary to some home and work stress. He is hopeful that the next few weeks will slow down. He has done some yard work. He does realize he hasn't had as much protein in the past few weeks.  Subjective:   1. OSA (obstructive sleep apnea) Travis Owens finally got his BiPAP machine, and he is down from upper 50's events to less than 10 events per hour. He voices he is still adjusting to wearing his mask.  2. Type 2 diabetes mellitus with hyperglycemia, without long-term current use of insulin ([name]) Travis Owens's highest blood sugar was of 154, and he denies hypoglycemia. He is on Ozempic and metformin.  Assessment/Plan:   1. OSA (obstructive sleep apnea) Intensive lifestyle modifications are the first line treatment for this issue. We discussed several lifestyle modifications today. Lorcan will continue to work on diet, exercise and weight loss efforts. We will continue to monitor, and will follow up at his next appointment. Orders and follow up as documented in patient record.   Counseling  Sleep apnea is a condition in which breathing pauses or becomes shallow during sleep. This happens over and over during the night. This disrupts your sleep and keeps your body from getting the rest that it needs, which  can cause tiredness and lack of energy (fatigue) during the day.  Sleep apnea treatment: If you were given a device to open your airway while you sleep, USE IT!  Sleep hygiene:   Limit or avoid alcohol, caffeinated beverages, and cigarettes, especially close to bedtime.   Do not eat a large meal or eat spicy foods right before bedtime. This can lead to digestive discomfort that can make it hard for you to sleep.  Keep a sleep diary to help you and your health care provider figure out what could be causing your insomnia.  . Make your bedroom a dark, comfortable place where it is easy to fall asleep. ? Put up shades or blackout curtains to block light from outside. ? Use a white noise machine to block noise. ? Keep the temperature cool. . Limit screen use before bedtime. This includes: ? Watching TV. ? Using your smartphone, tablet, or computer. . Stick to a routine that includes going to bed and waking up at the same times every day and night. This can help you fall asleep faster. Consider making a quiet activity, such as reading, part of your nighttime routine. . Try to avoid taking naps during the day so that you sleep better at night. . Get out of bed if you are still awake after 15 minutes of trying to sleep. Keep the lights down, but try reading or doing a quiet activity. When you feel sleepy, go back to bed.  2. Type 2 diabetes mellitus with hyperglycemia, without long-term current  use of insulin ([name]) Good blood sugar control is important to decrease the likelihood of diabetic complications such as nephropathy, neuropathy, limb loss, blindness, coronary artery disease, and death. Intensive lifestyle modification including diet, exercise and weight loss are the first line of treatment for diabetes. Carrel will continue to follow up as directed. We will follow up on his blood sugars at his next appointment.  3. Class 1 obesity with serious comorbidity and body mass index (BMI) of 30.0 to  30.9 in adult, unspecified obesity type Travis Owens is currently in the action stage of change. As such, his goal is to continue with weight loss efforts. He has agreed to practicing portion control and making smarter food choices, such as increasing vegetables and decreasing simple carbohydrates.   Exercise goals: All adults should avoid inactivity. Some physical activity is better than none, and adults who participate in any amount of physical activity gain some health benefits.  Behavioral modification strategies: increasing lean protein intake, increasing vegetables, meal planning and cooking strategies, keeping healthy foods in the home and planning for success.  Travis Owens has agreed to follow-up with our clinic in 2 to 3 weeks. He was informed of the importance of frequent follow-up visits to maximize his success with intensive lifestyle modifications for his multiple health conditions.   Objective:   Blood pressure 99/61, pulse 78, temperature (!) 97.5 F (36.4 C), temperature source Oral, height 5\' 10"  (1.778 m), weight 213 lb (96.6 kg), SpO2 96 %. Body mass index is 30.56 kg/m.  General: Cooperative, alert, well developed, in no acute distress. HEENT: Conjunctivae and lids unremarkable. Cardiovascular: Regular rhythm.  Lungs: Normal work of breathing. Neurologic: No focal deficits.   Lab Results  Component Value Date   CREATININE 0.82 09/13/2019   BUN 17 09/13/2019   NA 136 09/13/2019   K 4.5 09/13/2019   CL 100 09/13/2019   CO2 31 09/13/2019   Lab Results  Component Value Date   ALT 26 09/13/2019   AST 23 09/13/2019   ALKPHOS 60 09/13/2019   BILITOT 0.6 09/13/2019   Lab Results  Component Value Date   HGBA1C 7.5 (A) 09/13/2019   HGBA1C 9.1 (A) 06/21/2019   HGBA1C 9.5 (H) 04/30/2019   HGBA1C 8.3 (A) 12/17/2018   HGBA1C 9.5 (H) 09/12/2018   Lab Results  Component Value Date   INSULIN 24.2 04/30/2019   Lab Results  Component Value Date   TSH 1.33 09/13/2019   Lab  Results  Component Value Date   CHOL 139 09/13/2019   HDL 49.40 09/13/2019   LDLCALC 57 09/13/2019   TRIG 166.0 (H) 09/13/2019   CHOLHDL 3 09/13/2019   Lab Results  Component Value Date   WBC 8.7 09/13/2019   HGB 14.2 09/13/2019   HCT 42.7 09/13/2019   MCV 90.2 09/13/2019   PLT 276.0 09/13/2019   No results found for: IRON, TIBC, FERRITIN  Attestation Statements:   Reviewed by clinician on day of visit: allergies, medications, problem list, medical history, surgical history, family history, social history, and previous encounter notes.  Time spent on visit including pre-visit chart review and post-visit care and charting was 15 minutes.    I, Trixie Dredge, am acting as transcriptionist for Coralie Common, MD.  I have reviewed the above documentation for accuracy and completeness, and I agree with the above. - Jinny Blossom, MD

## 2019-12-23 ENCOUNTER — Telehealth: Payer: Self-pay

## 2019-12-23 ENCOUNTER — Ambulatory Visit (INDEPENDENT_AMBULATORY_CARE_PROVIDER_SITE_OTHER): Payer: No Typology Code available for payment source | Admitting: Family Medicine

## 2019-12-23 ENCOUNTER — Other Ambulatory Visit: Payer: Self-pay | Admitting: Family Medicine

## 2019-12-23 ENCOUNTER — Other Ambulatory Visit: Payer: Self-pay

## 2019-12-23 ENCOUNTER — Encounter (INDEPENDENT_AMBULATORY_CARE_PROVIDER_SITE_OTHER): Payer: Self-pay | Admitting: Family Medicine

## 2019-12-23 VITALS — BP 111/71 | HR 83 | Temp 97.9°F | Ht 70.0 in | Wt 220.0 lb

## 2019-12-23 DIAGNOSIS — I10 Essential (primary) hypertension: Secondary | ICD-10-CM

## 2019-12-23 DIAGNOSIS — E1165 Type 2 diabetes mellitus with hyperglycemia: Secondary | ICD-10-CM | POA: Diagnosis not present

## 2019-12-23 DIAGNOSIS — E669 Obesity, unspecified: Secondary | ICD-10-CM | POA: Diagnosis not present

## 2019-12-23 DIAGNOSIS — Z6831 Body mass index (BMI) 31.0-31.9, adult: Secondary | ICD-10-CM

## 2019-12-23 DIAGNOSIS — Z9189 Other specified personal risk factors, not elsewhere classified: Secondary | ICD-10-CM | POA: Diagnosis not present

## 2019-12-23 MED ORDER — LOSARTAN POTASSIUM 100 MG PO TABS: 50.0000 mg | ORAL_TABLET | Freq: Every day | ORAL | 0 refills | Status: DC

## 2019-12-23 MED ORDER — OZEMPIC (0.25 OR 0.5 MG/DOSE) 2 MG/1.5ML ~~LOC~~ SOPN: 0.5000 mg | PEN_INJECTOR | SUBCUTANEOUS | 1 refills | Status: DC

## 2019-12-23 MED FILL — OZEMPIC 0.25 OR 0.5 MG/DOSE: 2 | 28 days supply | Qty: 2 | Fill #0

## 2019-12-23 MED FILL — METOPROLOL SUCCINATE ER 100: 100 | 90 days supply | Qty: 90 | Fill #1

## 2019-12-23 NOTE — Telephone Encounter (Signed)
Pt is wanting referrals for 2 reasons. Pt has centivo, so therefore needs a referral to see any DR.   1. He is having GI issues and a hiatal hernia, per his wife. Wife sees Dr. Adria Devon, so she is requesting a referral to him.  2. Pt is having a hard time hearing and would like to look further into that.

## 2019-12-23 NOTE — Telephone Encounter (Signed)
Please advise 

## 2019-12-23 NOTE — Progress Notes (Signed)
Chief Complaint:   OBESITY Travis Owens is here to discuss his progress with his obesity treatment plan along with follow-up of his obesity related diagnoses. Travis Owens is on practicing portion control and making smarter food choices, such as increasing vegetables and decreasing simple carbohydrates and states he is following his eating plan approximately 50% of the time. Travis Owens states he is doing yard work for 80 minutes 2 times per week.  Today's visit was #: 97 Starting weight: 220 lbs Starting date: 04/30/2019 Today's weight: 220 lbs Today's date: 12/23/2019 Total lbs lost to date: 0 Total lbs lost since last in-office visit: 0  Interim History: Travis Owens voices he has been doing some stress eating and he is eating out 1-2 times per day. He is often going to [employer] or [employer], and getting burgers and fried chicken. He has been doing some yard work. He realizes he needs to be prepared to get back on the plan.  Subjective:   1. Type 2 diabetes mellitus with hyperglycemia, without long-term current use of insulin ([name]) Travis Owens has been eating out frequently. He is on Ozempic, ARB, metformin, and statin.  2. Essential hypertension Travis Owens's blood pressure is better controlled with decreased losartan dose. He denies chest pain, chest pressure, or headache.  3. At risk for heart disease Travis Owens is at a higher than average risk for cardiovascular disease due to obesity.   Assessment/Plan:   1. Type 2 diabetes mellitus with hyperglycemia, without long-term current use of insulin ([name]) Good blood sugar control is important to decrease the likelihood of diabetic complications such as nephropathy, neuropathy, limb loss, blindness, coronary artery disease, and death. Intensive lifestyle modification including diet, exercise and weight loss are the first line of treatment for diabetes. Candace will continue Ozempic at 0.5 mg SubQ weekly, and we will refill for 2 months.  - Semaglutide,0.25 or 0.5MG /DOS, (OZEMPIC, 0.25 OR  0.5 MG/DOSE,) 2 MG/1.5ML SOPN; Inject 0.375 mLs (0.5 mg total) into the skin once a week.  Dispense: 1.5 mL; Refill: 1  2. Essential hypertension Travis Owens is working on healthy weight loss and exercise to improve blood pressure control. We will watch for signs of hypotension as he continues his lifestyle modifications. We will refill losartan for 1 month.  - losartan (COZAAR) 100 MG tablet; Take 0.5 tablets (50 mg total) by mouth daily.  Dispense: 30 tablet; Refill: 0  3. At risk for heart disease Travis Owens was given approximately 15 minutes of coronary artery disease prevention counseling today. He is 64 y.o. male and has risk factors for heart disease including obesity. We discussed intensive lifestyle modifications today with an emphasis on specific weight loss instructions and strategies.   Repetitive spaced learning was employed today to elicit superior memory formation and behavioral change.  4. Class 1 obesity with serious comorbidity and body mass index (BMI) of 31.0 to 31.9 in adult, unspecified obesity type Travis Owens is currently in the action stage of change. As such, his goal is to continue with weight loss efforts. He has agreed to practicing portion control and making smarter food choices, such as increasing vegetables and decreasing simple carbohydrates.   Exercise goals: As is.  Behavioral modification strategies: increasing lean protein intake, meal planning and cooking strategies, keeping healthy foods in the home and planning for success.  Travis Owens has agreed to follow-up with our clinic in 2 to 3 weeks. He was informed of the importance of frequent follow-up visits to maximize his success with intensive lifestyle modifications for his multiple health  conditions.   Objective:   Blood pressure 111/71, pulse 83, temperature 97.9 F (36.6 C), temperature source Oral, height 5\' 10"  (1.778 m), weight 220 lb (99.8 kg), SpO2 95 %. Body mass index is 31.57 kg/m.  General: Cooperative, alert,  well developed, in no acute distress. HEENT: Conjunctivae and lids unremarkable. Cardiovascular: Regular rhythm.  Lungs: Normal work of breathing. Neurologic: No focal deficits.   Lab Results  Component Value Date   CREATININE 0.82 09/13/2019   BUN 17 09/13/2019   NA 136 09/13/2019   K 4.5 09/13/2019   CL 100 09/13/2019   CO2 31 09/13/2019   Lab Results  Component Value Date   ALT 26 09/13/2019   AST 23 09/13/2019   ALKPHOS 60 09/13/2019   BILITOT 0.6 09/13/2019   Lab Results  Component Value Date   HGBA1C 7.5 (A) 09/13/2019   HGBA1C 9.1 (A) 06/21/2019   HGBA1C 9.5 (H) 04/30/2019   HGBA1C 8.3 (A) 12/17/2018   HGBA1C 9.5 (H) 09/12/2018   Lab Results  Component Value Date   INSULIN 24.2 04/30/2019   Lab Results  Component Value Date   TSH 1.33 09/13/2019   Lab Results  Component Value Date   CHOL 139 09/13/2019   HDL 49.40 09/13/2019   LDLCALC 57 09/13/2019   TRIG 166.0 (H) 09/13/2019   CHOLHDL 3 09/13/2019   Lab Results  Component Value Date   WBC 8.7 09/13/2019   HGB 14.2 09/13/2019   HCT 42.7 09/13/2019   MCV 90.2 09/13/2019   PLT 276.0 09/13/2019   No results found for: IRON, TIBC, FERRITIN  Attestation Statements:   Reviewed by clinician on day of visit: allergies, medications, problem list, medical history, surgical history, family history, social history, and previous encounter notes.   I, Trixie Dredge, am acting as transcriptionist for Coralie Common, MD.  I have reviewed the above documentation for accuracy and completeness, and I agree with the above. - Jinny Blossom, MD

## 2019-12-24 ENCOUNTER — Other Ambulatory Visit: Payer: Self-pay

## 2019-12-24 DIAGNOSIS — H9190 Unspecified hearing loss, unspecified ear: Secondary | ICD-10-CM

## 2019-12-24 DIAGNOSIS — K449 Diaphragmatic hernia without obstruction or gangrene: Secondary | ICD-10-CM

## 2019-12-24 NOTE — Telephone Encounter (Signed)
Referrals have been placed.  

## 2019-12-24 NOTE — Telephone Encounter (Signed)
[place] with placing referrals to GI and audiology.  [name]. [name], MD 12/24/2019 9:12 AM

## 2020-01-02 ENCOUNTER — Encounter: Payer: Self-pay | Admitting: Cardiovascular Disease

## 2020-01-02 ENCOUNTER — Other Ambulatory Visit: Payer: Self-pay

## 2020-01-02 ENCOUNTER — Ambulatory Visit (INDEPENDENT_AMBULATORY_CARE_PROVIDER_SITE_OTHER): Payer: No Typology Code available for payment source | Admitting: Cardiovascular Disease

## 2020-01-02 VITALS — BP 126/70 | HR 72 | Ht 70.0 in | Wt 223.0 lb

## 2020-01-02 DIAGNOSIS — I428 Other cardiomyopathies: Secondary | ICD-10-CM

## 2020-01-02 DIAGNOSIS — G4733 Obstructive sleep apnea (adult) (pediatric): Secondary | ICD-10-CM

## 2020-01-02 DIAGNOSIS — I2510 Atherosclerotic heart disease of native coronary artery without angina pectoris: Secondary | ICD-10-CM

## 2020-01-02 DIAGNOSIS — I5042 Chronic combined systolic (congestive) and diastolic (congestive) heart failure: Secondary | ICD-10-CM | POA: Diagnosis not present

## 2020-01-02 DIAGNOSIS — E118 Type 2 diabetes mellitus with unspecified complications: Secondary | ICD-10-CM

## 2020-01-02 DIAGNOSIS — E785 Hyperlipidemia, unspecified: Secondary | ICD-10-CM

## 2020-01-02 DIAGNOSIS — I447 Left bundle-branch block, unspecified: Secondary | ICD-10-CM

## 2020-01-02 NOTE — Progress Notes (Addendum)
Cardiology Office Note    Date:  01/06/2020   ID:  Travis Owens, DOB 11/07/1955, MRN [id number]  PCP:  Travis Barrack, MD  Cardiologist:  Travis Majestic, MD (sleep): Travis Owens  New sleep evaluation.  History of Present Illness:  Travis Owens is a 64 y.o. male who is followed by Travis Owens for primary cardiology care.  He has a history of chronic combined systolic and diastolic heart failure, type 2 diabetes mellitus, hypertension, as well as hyperlipidemia.  He has been demonstrated to have left bundle branch block.  Coronary CTA in February 2021 showed nonobstructive CAD with a calcium score of 111 (62nd percentile for age/gender.  CMR on July 25, 2019 showed an EF 45% with RV EF 37% and no late gadolinium enhancement.  Due to concerns for obstructive sleep apnea, he was referred for a sleep study on May 23, 2019.  He met split-night criteria and on the diagnostic portion of the study sleep apnea was severe with an AHI of 50.5/h, RDI 76.1/h, and he was unable to achieve any REM sleep.  Oxygen desaturated to a nadir of 76%.  He underwent CPAP titration and was transitioned to BiPAP therapy due to central events.  He was titrated up to 18/14 but he did not have any REM sleep at that stage.    Prior to CPAP therapy, he admits to snoring, daytime sleepiness, frequent nocturia, nonrestorative sleep, as well as fatigability.  He typically goes to bed at midnight and may wake up around 6 or 7 AM.  He states remotely he had a sleep study 20 years ago.  The office today were able to obtain several downloads.  It appears that his initial download is from November 12, 2019 through December 11, 2019.  During that timeframe he was meeting usage compliance at 90 days but he just barely missed duration compliance with usage greater than 4 hours at only 67%.  His average usage was 4 hours and 45 minutes.  AHI was 12.2 and his 95th percentile pressure was 21.7/17.7.  Download in August showed decreased  compliance but he had been out of town and forgot to take his machine with him.  AHI was 8.8 and 95th percentile pressure was 21.6/17.6.  [place] he has used CPAP with 100% compliance since his return in August 29.  However the download from August 10 through January 01, 2020 was inclusive of days where he did not have machine and therefore compliance was not met.  He does admit to feeling improved since initiating CPAP therapy, however, he still has residual daytime sleepiness.  An Epworth Sleepiness Scale score was endorsed in the office today and this endorsed at 13 as shown below:  Epworth Sleepiness Scale: Situation   Chance of Dozing/Sleeping (0 = never , 1 = slight chance , 2 = moderate chance , 3 = high chance )   sitting and reading 2   watching TV 2   sitting inactive in a public place 1   being a passenger in a motor vehicle for an hour or more 2   lying down in the afternoon 3   sitting and talking to someone 0   sitting quietly after lunch (no alcohol) 3   while stopped for a few minutes in traffic as the driver 0   Total Score  13     He presents for evaluation.  Past Medical History:  Diagnosis Date  . Anxiety   . Chest pain   . Depression   . Diabetes mellitus without complication ([place])   . Fatty liver   . GERD (gastroesophageal reflux disease)   . Hyperlipidemia   . Hypertension   . Sleep apnea    has CPAP/does not use    Past Surgical History:  Procedure Laterality Date  . COLONOSCOPY     Travis Owens  2008    Current Medications: Outpatient Medications Prior to Visit  Medication Sig Dispense Refill  . aspirin EC 81 MG tablet Take 81 mg by mouth daily.    Marland Kitchen atorvastatin (LIPITOR) 80 MG tablet Take 1 tablet (80 mg total) by mouth daily at 6 PM. 90 tablet 3  . Blood Glucose Monitoring Suppl (FREESTYLE LITE) DEVI Check Blood sugar twice daily 1 Device 0  . escitalopram (LEXAPRO) 20 MG tablet Take 1  tablet (20 mg total) by mouth daily. 90 tablet 3  . ezetimibe (ZETIA) 10 MG tablet Take 1 tablet (10 mg total) by mouth daily. 90 tablet 3  . glucose blood (FREESTYLE LITE) test strip Check Blood sugar twice daily 100 each 12  . Lancets (FREESTYLE) lancets Check blood sugar twice daily 100 each 12  . losartan (COZAAR) 100 MG tablet Take 0.5 tablets (50 mg total) by mouth daily. 30 tablet 0  . metFORMIN (GLUCOPHAGE) 1000 MG tablet Take 1 tablet (1,000 mg total) by mouth 2 (two) times daily. 180 tablet 3  . metoprolol succinate (TOPROL-XL) 50 MG 24 hr tablet Take 50 mg by mouth daily. Take with or immediately following a meal.    . Multiple Vitamin (MULTIVITAMIN) tablet Take 1 tablet by mouth daily.    . pantoprazole (PROTONIX) 40 MG tablet Take 1 tablet (40 mg total) by mouth daily. 90 tablet 3  . Semaglutide,0.25 or 0.5MG/DOS, (OZEMPIC, 0.25 OR 0.5 MG/DOSE,) 2 MG/1.5ML SOPN Inject 0.375 mLs (0.5 mg total) into the skin once a week. 1.5 mL 1  . Vitamin D, Ergocalciferol, (DRISDOL) 1.25 MG (50000 UNIT) CAPS capsule Take 1 capsule (50,000 Units total) by mouth every 7 (seven) days. 4 capsule 0  . metoprolol succinate (TOPROL XL) 100 MG 24 hr tablet Take 1 tablet (100 mg total) by mouth daily. (Patient not taking: Reported on 01/02/2020) 90 tablet 3   No facility-administered medications prior to visit.     Allergies:   Patient has no known allergies.   Social History   Socioeconomic History  . Marital status: Married    Spouse name: Travis Owens  . Number of children: Not on file  . Years of education: Not on file  . Highest education level: Not on file  Occupational History  . Occupation: Theme park manager    Comment: [employer]  Tobacco Use  . Smoking status: Never Smoker  . Smokeless tobacco: Never Used  Vaping Use  . Vaping Use: Never used  Substance and Sexual Activity  . Alcohol use: No  . Drug use: No  . Sexual activity: Yes  Other Topics Concern  . Not on file  Social  History Narrative  . Not on file   Social Determinants of Health   Financial Resource Strain:   . Difficulty of Paying Living Expenses: Not on file  Food Insecurity:   . Worried About Charity fundraiser in the Last Year: Not on file  . Ran Out of Food in the Last Year: Not on file  Transportation Needs:   . Lack of Transportation (  Medical): Not on file  . Lack of Transportation (Non-Medical): Not on file  Physical Activity:   . Days of Exercise per Week: Not on file  . Minutes of Exercise per Session: Not on file  Stress:   . Feeling of Stress : Not on file  Social Connections:   . Frequency of Communication with Friends and Family: Not on file  . Frequency of Social Gatherings with Friends and Family: Not on file  . Attends Religious Services: Not on file  . Active Member of Clubs or Organizations: Not on file  . Attends Archivist Meetings: Not on file  . Marital Status: Not on file    Socially he is married for 20 years.  He does not have children.  He works as a Theme park manager.  Family History:  The patient's family history includes Cancer in his father; Hypertension in his paternal grandmother; Stroke in his father and paternal grandfather.  Father died at age 6 and was a heavy smoker who had lung cancer Mother died at age 29 He has 1 brother age 48 A cousin has sleep apnea.  ROS General: Negative; No fevers, chills, or night sweats;  HEENT: Negative; No changes in vision or hearing, sinus congestion, difficulty swallowing Pulmonary: Negative; No cough, wheezing, shortness of breath, hemoptysis Cardiovascular: Negative; No chest pain, presyncope, syncope, palpitations GI: Negative; No nausea, vomiting, diarrhea, or abdominal pain GU: Negative; No dysuria, hematuria, or difficulty voiding Musculoskeletal: Negative; no myalgias, joint pain, or weakness Hematologic/Oncology: Negative; no easy bruising, bleeding Endocrine: Negative; no heat/cold intolerance; no  diabetes Neuro: Negative; no changes in balance, headaches Skin: Negative; No rashes or skin lesions Psychiatric: Negative; No behavioral problems, depression Sleep: Positive for OSA, severe with snoring, daytime sleepiness, nonrestorative sleep, frequent awakenings, and excessive daytime sleepiness.  No bruxism, restless legs, hypnogognic hallucinations, no cataplexy Other comprehensive 14 point system review is negative.   PHYSICAL EXAM:   VS:  BP 126/70   Pulse 72   Ht 5' 10" (1.778 m)   Wt 223 lb (101.2 kg)   BMI 32.00 kg/m     Repeat blood pressure by me 130/74  Wt Readings from Last 3 Encounters:  01/02/20 223 lb (101.2 kg)  12/23/19 220 lb (99.8 kg)  12/02/19 213 lb (96.6 kg)    General: Alert, oriented, no distress.  Skin: normal turgor, no rashes, warm and dry HEENT: Normocephalic, atraumatic. Pupils equal round and reactive to light; sclera anicteric; extraocular muscles intact; Fundi ** Nose without nasal septal hypertrophy Mouth/Parynx benign; Mallinpatti scale Neck: No JVD, no carotid bruits; normal carotid upstroke Lungs: clear to ausculatation and percussion; no wheezing or rales Chest wall: without tenderness to palpitation Heart: PMI not displaced, RRR, s1 s2 normal, 1/6 systolic murmur, no diastolic murmur, no rubs, gallops, thrills, or heaves Abdomen: soft, nontender; no hepatosplenomehaly, BS+; abdominal aorta nontender and not dilated by palpation. Back: no CVA tenderness Pulses 2+ Musculoskeletal: full range of motion, normal strength, no joint deformities Extremities: no clubbing cyanosis or edema, Homan's sign negative  Neurologic: grossly nonfocal; Cranial nerves grossly wnl Psychologic: Normal mood and affect   Studies/Labs Reviewed:   EKG:  EKG is not ordered today.  I personally reviewed the ECG of May 02, 2019 which showed normal sinus rhythm at 82 with left bundle branch block and repolarization changes.  Recent Labs: BMP Latest Ref Rng &  Units 09/13/2019 04/30/2019 09/12/2018  Glucose 70 - 99 mg/dL 141(H) 183(H) 138(H)  BUN 6 - 23 mg/dL 17  13 13  Creatinine 0.40 - 1.50 mg/dL 0.82 0.62(L) 0.76  BUN/Creat Ratio 10 - 24 - 21 -  Sodium 135 - 145 mEq/L 136 138 138  Potassium 3.5 - 5.1 mEq/L 4.5 4.4 4.8  Chloride 96 - 112 mEq/L 100 101 99  CO2 19 - 32 mEq/L _0 Calcium 8.4 - 10.5 mg/dL 9.7 9.5 9.6     Hepatic Function Latest Ref Rng & Units 09/13/2019 04/30/2019 09/12/2018  Total Protein 6.0 - 8.3 g/dL 7.1 7.0 7.3  Albumin 3.5 - 5.2 g/dL 4.7 4.5 4.7  AST 0 - 37 U/L _1 ALT 0 - 53 U/L _2 Alk Phosphatase 39 - 117 U/L 60 73 57  Total Bilirubin 0.2 - 1.2 mg/dL 0.6 0.3 0.4    CBC Latest Ref Rng & Units 09/13/2019 04/30/2019 09/12/2018  WBC 4.0 - 10.5 K/uL 8.7 8.3 9.2  Hemoglobin 13.0 - 17.0 g/dL 14.2 14.1 14.0  Hematocrit 39 - 52 % 42.7 42.1 41.6  Platelets 150 - 400 K/uL 276.0 273 289.0   Lab Results  Component Value Date   MCV 90.2 09/13/2019   MCV 87 04/30/2019   MCV 88.8 09/12/2018   Lab Results  Component Value Date   TSH 1.33 09/13/2019   Lab Results  Component Value Date   HGBA1C 7.5 (A) 09/13/2019     BNP No results found for: BNP  ProBNP No results found for: PROBNP   Lipid Panel     Component Value Date/Time   CHOL 139 09/13/2019 0941   CHOL 163 04/30/2019 1145   TRIG 166.0 (H) 09/13/2019 0941   HDL 49.40 09/13/2019 0941   HDL 55 04/30/2019 1145   CHOLHDL 3 09/13/2019 0941   VLDL 33.2 09/13/2019 0941   LDLCALC 57 09/13/2019 0941   LDLCALC 75 04/30/2019 1145   LABVLDL 33 04/30/2019 1145     RADIOLOGY: No results found.   Additional studies/ records that were reviewed today include:   SPLIT NIGHT STUDY: 05/23/2019 SLEEP STUDY TECHNIQUE As per the AASM Manual for the Scoring of Sleep and Associated Events v2.3 (April 2016) with a hypopnea requiring 4% desaturations.  The channels recorded and monitored were frontal, central and occipital EEG, electrooculogram (EOG),  submentalis EMG (chin), nasal and oral airflow, thoracic and abdominal wall motion, anterior tibialis EMG, snore microphone, electrocardiogram, and pulse oximetry. Bi-level positive airway pressure (BiPAP) was initiated when the patient met split night criteria and was titrated according to treat sleep-disordered breathing.  RESPIRATORY PARAMETERS Diagnostic Total AHI (/hr):            50.5     RDI (/hr):         76.1     OA Index (/hr):            16.1     CA Index (/hr):      0.0 REM AHI (/hr):            N/A      NREM AHI (/hr):          50.5     Supine AHI (/hr):         50.5     Non-supine AHI (/hr):        N/A Min O2 Sat (%):          76.0     Mean O2 (%):  91.9     Time below 88% (min):  15.3       Titration Optimal IPAP Pressure (cm): 18        Optimal EPAP Pressure (cm):            14        AHI at Optimal Pressure (/hr):          0.0       Min O2 at Optimal Pressure (%):       93.0 Sleep % at Optimal (%):         8          Supine % at Optimal (%):       100         SLEEP ARCHITECTURE The study was initiated at 10:41:27 PM and terminated at 4:41:50 AM. The total recorded time was 360.4 minutes. EEG confirmed total sleep time was 257.5 minutes yielding a sleep efficiency of 71.5%%. Sleep onset after lights out was 24.9 minutes with a REM latency of 202.5 minutes. The patient spent 14.6%% of the night in stage N1 sleep, 74.6%% in stage N2 sleep, 2.7%% in stage N3 and 8.2% in REM. Wake after sleep onset (WASO) was 78.0 minutes. The Arousal Index was 62.2/hour.   LEG MOVEMENT DATA The total Periodic Limb Movements of Sleep (PLMS) were 0. The PLMS index was 0.0 .  CARDIAC DATA The 2 lead EKG demonstrated sinus rhythm. The mean heart rate was 100.0 beats per minute. Other EKG findings include: None.  IMPRESSIONS - Severe obstructive sleep apnea occurred during the diagnostic portion of the study (AHI  50.5 /h; RDI 76.1/h, with absence of REM sleep). CPAP was initiated at 6  cm,  titrated to 7 cm and was then transitioned to BiPAP therapy due to central events.  BiPAP was initated at 9/5 with maximum titration 18/14 cm of water. AHI at 18/14 was 0, but RDI 86.1 without any REM sleep. - No central sleep apnea occurred during the diagnostic portion of the study (CAI = 0.0/hour). - Severe oxygen desaturation during the diagnostic portion of the study to a nadir of 76.0%. - No snoring was audible during this study. - No cardiac abnormalities were noted during this study. - Clinically significant periodic limb movements of sleep did not occur during the study.  DIAGNOSIS - Obstructive Sleep Apnea (327.23 [G47.33 ICD-10])  RECOMMENDATIONS - Recommend an initial trial of BiPAP Auto therapy with an EPAP min of 13, PS of 4 and IPAP max of 25 cm H2O with heated humidification.  A Medium size Resmed Full Face Mask AirFit F20 mask was used for the titration.  - Effort should be made to optimize nasal and oropharyngeal patency.  - Avoid alcohol, sedatives and other CNS depressants that may worsen sleep apnea and disrupt normal sleep architecture. - Sleep hygiene should be reviewed to assess factors that may improve sleep quality. - Weight management and regular exercise should be initiated or continued. - Recommend a download in 30 days and sleep Center for evaluation after 4 weeks of therapy.  [Electronically signed] 05/28/2019 09:14 AM   ASSESSMENT:    1. Severe obstructive sleep apnea   2. Chronic combined systolic and diastolic heart failure ([place])   3. Coronary artery disease involving native coronary artery of native heart without angina pectoris   4. Nonischemic cardiomyopathy ([place])   5. LBBB (left bundle branch block)   6. Type 2 diabetes mellitus with complication, without long-term current use of insulin ([name])   7. Hyperlipidemia with target LDL less than  61     PLAN:  Travis Owens is a 65 year old gentleman who is followed by Travis Owens and has  significant cardiovascular comorbidities including a history of chronic combined systolic and diastolic heart failure, hypertension, and hyperlipidemia.  He has been demonstrated to have mild nonobstructive plaque on coronary CTA and EF on CMR at 45% consistent with his echo reading.  He has been on losartan and metoprolol for hypertension, atorvastatin and Zetia for hyperlipidemia and Metformin for his diabetes mellitus.  He had significant symptoms suggesting sleep apnea including snoring, daytime sleepiness, frequent awakenings, nocturia, nonrestorative sleep, as well as fatigability.  I thoroughly reviewed his split-night sleep study which demonstrated severe sleep apnea on the diagnostic portion of the test and he had inability to achieve rem sleep.  There was significant oxygen desaturation to a nadir of 76%.  He required BiPAP titration.  He has been meeting compliance standards with reference to usage but his usage duration has just been short of the required 70% of days greater than 4 hours.  I suspect he will meet compliance and I discussed with him the importance of meeting compliance within 90 days or potentially run the risk that insurance can take back his machine.  He had gone out of town to [employer] and unfortunately did not take the machine with him which has led to not meeting compliance standards on his most recent download with reference to usage duration.  He has been set at a minimum EPAP pressure of 13 with a maximum IPAP pressure of 25 and his most recent download shows an H AHI of 8.6.  Central apnea index was 2.1.  I will make adjustments to his equipment and am decreasing his ramp time from 30 minutes down to 15 minutes.  I am also increasing his minimum EPAP initiation to 14 and will change his pressure support up to 5 cm of water such that his initial starting pressure and following his ramp will be 19/14.  He has been consistently having 95th percentile pressures at almost 22/18.   We will need to obtain a download within 4 weeks to verify compliance is being met.  If there are issues I will see him at that time otherwise I will see him in 6 months for reevaluation.    Medication Adjustments/Labs and Tests Ordered: Current medicines are reviewed at length with the patient today.  Concerns regarding medicines are outlined above.  Medication changes, Labs and Tests ordered today are listed in the Patient Instructions below. Patient Instructions  Medication Instructions:  CONTINUE WITH CURRENT MEDICATIONS. NO CHANGES.  *If you need a refill on your cardiac medications before your next appointment, please call your pharmacy*  Follow-Up: At [facility], you and your health needs are our priority.  As part of our continuing mission to provide you with exceptional heart care, we have created designated Provider Care Teams.  These Care Teams include your primary Cardiologist (physician) and Advanced Practice Providers (APPs -  Physician Assistants and Nurse Practitioners) who all work together to provide you with the care you need, when you need it.  We recommend signing up for the patient portal called "MyChart".  Sign up information is provided on this After Visit Summary.  MyChart is used to connect with patients for Virtual Visits (Telemedicine).  Patients are able to view lab/test results, encounter notes, upcoming appointments, etc.  Non-urgent messages can be sent to your provider as well.   To learn more about what you  can do with MyChart, go to [email or web address].    Your next appointment:   6 month(s)  The format for your next appointment:   In Person  Provider:   Shelva Majestic, MD       Signed, Travis Majestic, MD  01/06/2020 5:06 PM    [place] [address] [place], [place], [place]  [address] Phone: [phone]

## 2020-01-02 NOTE — Patient Instructions (Signed)

## 2020-01-03 ENCOUNTER — Encounter (INDEPENDENT_AMBULATORY_CARE_PROVIDER_SITE_OTHER): Payer: Self-pay | Admitting: Family Medicine

## 2020-01-06 ENCOUNTER — Ambulatory Visit (INDEPENDENT_AMBULATORY_CARE_PROVIDER_SITE_OTHER): Payer: No Typology Code available for payment source | Admitting: Family Medicine

## 2020-01-10 MED FILL — ATORVASTATIN 80 MG TABLET: 80 | 90 days supply | Qty: 90 | Fill #2

## 2020-01-10 MED FILL — PANTOPRAZOLE SOD DR 40 MG T: 40 | 90 days supply | Qty: 90 | Fill #1

## 2020-01-10 MED FILL — ESCITALOPRAM 20 MG TABLET: 20 | 90 days supply | Qty: 90 | Fill #1

## 2020-01-10 MED FILL — EZETIMIBE 10 MG TABS: 10 | 90 days supply | Qty: 90 | Fill #1

## 2020-01-10 MED FILL — metFORMIN HCL 1000 MG TABS: 1000 | 90 days supply | Qty: 180 | Fill #1

## 2020-01-10 NOTE — Progress Notes (Signed)
Cardiology Office Note:    Date:  01/14/2020   ID:  Travis Owens, DOB [birth year], MRN [id number]  PCP:  [name], MD  Cardiologist:  Donato Heinz, MD  Electrophysiologist:  None   Referring MD: [name], MD   Chief Complaint  Patient presents with  . Congestive Heart Failure    History of Present Illness:    Travis Owens is a 64 y.o. male with a hx of chronic combined systolic and diastolic heart failure, type 2 diabetes, hypertension, hyperlipidemia, OSA who present for follow-up.  He was is referred by Dr. Leafy Ro for evaluation of left bundle branch block on 05/01/18.  He was referred to Dr. Leafy Ro for weight loss evaluation, an EKG was done which showed left bundle branch block.  TTE was done on 05/03/2019 and showed EF 40 to 45%, with marked marked septal-lateral dyssynchrony consistent with LBBB.  Coronary CTA on 06/06/2019 showed nonobstructive CAD (calcified plaque in the RPDA causing mild (25-49%) stenosis and calcified plaque in the proximal LAD and proximal RCA causing minimal (0-24%) stenosis).  Calcium score 111 (62nd percentile for age/gender).  CMR on 07/25/2019 showed EF 45%, no LGE, RV EF 37%.  Since last clinic visit, reports that he has been doing well.  Does state that he had some dizziness when he was taking Toprol-XL 100 mg daily.  Decreased dose to 50 mg daily and dizziness resolved.  He denies any chest pain, dyspnea, syncope, palpitations, or lower extremity edema.  He has started using BiPAP for OSA, states that he feels significantly improved.  He lost 15 pounds earlier this year but has gained it back.  He has been following with weight management.  Reports that he has not been exercising recently.   Wt Readings from Last 3 Encounters:  01/14/20 229 lb (103.9 kg)  01/02/20 223 lb (101.2 kg)  12/23/19 220 lb (99.8 kg)      Past Medical History:  Diagnosis Date  . Anxiety   . Chest pain   . Depression   . Diabetes mellitus without  complication ([place])   . Fatty liver   . GERD (gastroesophageal reflux disease)   . Hyperlipidemia   . Hypertension   . Sleep apnea    has CPAP/does not use    Past Surgical History:  Procedure Laterality Date  . COLONOSCOPY     Dr. Olevia Perches  2008    Current Medications: No outpatient medications have been marked as taking for the 01/14/20 encounter (Office Visit) with Donato Heinz, MD.     Allergies:   Patient has no known allergies.   Social History   Socioeconomic History  . Marital status: Married    Spouse name: Travis Owens  . Number of children: Not on file  . Years of education: Not on file  . Highest education level: Not on file  Occupational History  . Occupation: Theme park manager    Comment: [employer]  Tobacco Use  . Smoking status: Never Smoker  . Smokeless tobacco: Never Used  Vaping Use  . Vaping Use: Never used  Substance and Sexual Activity  . Alcohol use: No  . Drug use: No  . Sexual activity: Yes  Other Topics Concern  . Not on file  Social History Narrative  . Not on file   Social Determinants of Health   Financial Resource Strain:   . Difficulty of Paying Living Expenses: Not on file  Food Insecurity:   . Worried About  Running Out of Food in the Last Year: Not on file  . Ran Out of Food in the Last Year: Not on file  Transportation Needs:   . Lack of Transportation (Medical): Not on file  . Lack of Transportation (Non-Medical): Not on file  Physical Activity:   . Days of Exercise per Week: Not on file  . Minutes of Exercise per Session: Not on file  Stress:   . Feeling of Stress : Not on file  Social Connections:   . Frequency of Communication with Friends and Family: Not on file  . Frequency of Social Gatherings with Friends and Family: Not on file  . Attends Religious Services: Not on file  . Active Member of Clubs or Organizations: Not on file  . Attends Archivist Meetings: Not on file  . Marital Status:  Not on file     Family History: The patient's family history includes Cancer in his father; Hypertension in his paternal grandmother; Stroke in his father and paternal grandfather. There is no history of Colon cancer, Colon polyps, Esophageal cancer, Rectal cancer, or Stomach cancer.  ROS:   Please see the history of present illness.     All other systems reviewed and are negative.  EKGs/Labs/Other Studies Reviewed:    The following studies were reviewed today:   EKG:  EKG is ordered today.  The last ekg demonstrates normal sinus rhythm, rate 72, left bundle branch block  TTE 05/03/19:  1. Left ventricular ejection fraction, by visual estimation, is 40 to 45%. The left ventricle has mild to moderately decreased function. There is mildly increased left ventricular hypertrophy. There is marked septal-lateral dyssynchrony consistent with  LBBB.  2. Left ventricular diastolic parameters are consistent with Grade I diastolic dysfunction (impaired relaxation).  3. Global right ventricle has normal systolic function.The right ventricular size is normal. No increase in right ventricular wall thickness.  4. Left atrial size was normal.  5. Right atrial size was normal.  6. The mitral valve is normal in structure. No evidence of mitral valve regurgitation. No evidence of mitral stenosis.  7. The tricuspid valve is normal in structure.  8. The aortic valve is tricuspid. Aortic valve regurgitation is not visualized. Mild aortic valve sclerosis without stenosis  9. TR signal is inadequate for assessing pulmonary artery systolic pressure. 10. The inferior vena cava is normal in size with greater than 50% respiratory variability, suggesting right atrial pressure of 3 mmHg.  Coronry CTA 06/06/19: 1. Coronary calcium score of 111. This was 43 percentile for age and sex matched control.  2. Normal coronary origin with right dominance.  3. Nonobstructive CAD. There is calcified plaque in the RPDA  causing mild (25-49%) stenosis and calcified plaque in the proximal LAD and proximal RCA causing minimal (0-24%) stenosis  CAD-RADS 2. Mild non-obstructive CAD (25-49%). Consider non-atherosclerotic causes of chest pain. Consider preventive therapy and risk factor modification.1. Coronary calcium score of 111. This was 64 percentile for age and sex matched control.  2. Normal coronary origin with right dominance.  3. Nonobstructive CAD. There is calcified plaque in the RPDA causing mild (25-49%) stenosis and calcified plaque in the proximal LAD and proximal RCA causing minimal (0-24%) stenosis  CAD-RADS 2. Mild non-obstructive CAD (25-49%). Consider non-atherosclerotic causes of chest pain. Consider preventive therapy and risk factor modification.  Noncardiac: IMPRESSION: No significant noncardiac findings. Incidental 6 mm densely calcified granuloma in the lingula.  CMR 07/25/19: 1. Normal LV size and thickness with markedly abnormal septal  motion EF 45%. 2.  Possible LV non compaction seen best in SA/3 chamber views 3. No delayed gadolinium uptake, scar, infiltration, infarct in LV myocardium 4.  Basal RV dilatation and hypokinesis RVEF 37% 5   normal MV,AV, TV 6.  Normal Aortic root 3.0 cm 7.  Normal T2* 48 msec 8. Unable to calculate T1/ECG failure to acquire adequate T1 map sequence pre contrast    Recent Labs: 09/13/2019: ALT 26; BUN 17; Creatinine, Ser 0.82; Hemoglobin 14.2; Platelets 276.0; Potassium 4.5; Sodium 136; TSH 1.33  Recent Lipid Panel    Component Value Date/Time   CHOL 139 09/13/2019 0941   CHOL 163 04/30/2019 1145   TRIG 166.0 (H) 09/13/2019 0941   HDL 49.40 09/13/2019 0941   HDL 55 04/30/2019 1145   CHOLHDL 3 09/13/2019 0941   VLDL 33.2 09/13/2019 0941   LDLCALC 57 09/13/2019 0941   LDLCALC 75 04/30/2019 1145    Physical Exam:    VS:  BP 122/76   Pulse 72   Ht 5\' 10"  (1.778 m)   Wt 229 lb (103.9 kg)   SpO2 97%   BMI 32.86 kg/m      Wt Readings from Last 3 Encounters:  01/14/20 229 lb (103.9 kg)  01/02/20 223 lb (101.2 kg)  12/23/19 220 lb (99.8 kg)     GEN:   in no acute distress HEENT: Normal NECK: No JVD CARDIAC:RRR, no murmurs, rubs, gallops RESPIRATORY:  Clear to auscultation without rales, wheezing or rhonchi  ABDOMEN: Soft, non-tender, non-distended MUSCULOSKELETAL:  No edema; No deformity  SKIN: Warm and dry NEUROLOGIC:  Alert and oriented x 3 PSYCHIATRIC:  Normal affect   ASSESSMENT:    1. Chronic combined systolic and diastolic heart failure ([place])   2. Coronary artery disease involving native coronary artery of native heart without angina pectoris   3. Essential hypertension   4. Hyperlipidemia with target LDL less than 70    PLAN:     Chronic combined systolic and diastolic heart failure:: EF 40 to 45% on TTE from 05/02/18, with marked marked septal-lateral dyssynchrony consistent with LBBB.  Coronary CTA showed nonobstructive CAD.  CMR showed EF 45%, no LGE.  Appears euvolemic.  Suspect systolic dysfunction due to LBBB -Continue losartan 100 mg daily -Continue Toprol-XL 50 mg daily.  Unable to tolerate 100 mg daily due to dizziness  Coronary artery disease: coronary CTA on 06/06/2019 showed nonobstructive CAD (calcified plaque in the RPDA causing mild (25-49%) stenosis and calcified plaque in the proximal LAD and proximal RCA causing minimal (0-24%) stenosis.  Calcium score 111 (62nd percentile for age/gender). -Continue atorvastatin 80 mg daily  Hypertension: On losartan 100 mg daily and will increase Toprol-XL to 100 mg daily as above.  Appears well controlled.  Hyperlipidemia: Continue atorvastatin 80 mg daily and Zetia 10 mg daily.  LDL 57 on 09/13/2019, at goal less than 70  Type 2 diabetes: [name] 7.5 on 09/13/19.   On metformin, Victoza, and Jardiance   OSA: Sleep study on 05/15/2019 confirmed OSA, has started on Bipap  RTC in 6 months  Medication Adjustments/Labs and Tests  Ordered: Current medicines are reviewed at length with the patient today.  Concerns regarding medicines are outlined above.  Orders Placed This Encounter  Procedures  . EKG 12-Lead   No orders of the defined types were placed in this encounter.   Patient Instructions  Medication Instructions:  Your physician recommends that you continue on your current medications as directed. Please refer to the Current Medication list  given to you today.  *If you need a refill on your cardiac medications before your next appointment, please call your pharmacy*  Follow-Up: At [facility], you and your health needs are our priority.  As part of our continuing mission to provide you with exceptional heart care, we have created designated Provider Care Teams.  These Care Teams include your primary Cardiologist (physician) and Advanced Practice Providers (APPs -  Physician Assistants and Nurse Practitioners) who all work together to provide you with the care you need, when you need it.  We recommend signing up for the patient portal called "MyChart".  Sign up information is provided on this After Visit Summary.  MyChart is used to connect with patients for Virtual Visits (Telemedicine).  Patients are able to view lab/test results, encounter notes, upcoming appointments, etc.  Non-urgent messages can be sent to your provider as well.   To learn more about what you can do with MyChart, go to [email or web address].    Your next appointment:   6 month(s)  The format for your next appointment:   In Person  Provider:   Oswaldo Milian, MD       Signed, Donato Heinz, MD  01/14/2020 8:34 AM    [place]

## 2020-01-14 ENCOUNTER — Other Ambulatory Visit: Payer: Self-pay

## 2020-01-14 ENCOUNTER — Ambulatory Visit (INDEPENDENT_AMBULATORY_CARE_PROVIDER_SITE_OTHER): Payer: No Typology Code available for payment source | Admitting: Cardiology

## 2020-01-14 VITALS — BP 122/76 | HR 72 | Ht 70.0 in | Wt 229.0 lb

## 2020-01-14 DIAGNOSIS — I10 Essential (primary) hypertension: Secondary | ICD-10-CM

## 2020-01-14 DIAGNOSIS — E785 Hyperlipidemia, unspecified: Secondary | ICD-10-CM | POA: Diagnosis not present

## 2020-01-14 DIAGNOSIS — I5042 Chronic combined systolic (congestive) and diastolic (congestive) heart failure: Secondary | ICD-10-CM | POA: Diagnosis not present

## 2020-01-14 DIAGNOSIS — I2510 Atherosclerotic heart disease of native coronary artery without angina pectoris: Secondary | ICD-10-CM | POA: Diagnosis not present

## 2020-01-14 NOTE — Patient Instructions (Signed)

## 2020-01-24 MED FILL — OZEMPIC 0.25 OR 0.5 MG/DOSE: 2 | 84 days supply | Qty: 5 | Fill #1

## 2020-03-17 ENCOUNTER — Ambulatory Visit: Payer: No Typology Code available for payment source | Admitting: Family Medicine

## 2020-03-18 ENCOUNTER — Ambulatory Visit (INDEPENDENT_AMBULATORY_CARE_PROVIDER_SITE_OTHER): Payer: No Typology Code available for payment source | Admitting: Family Medicine

## 2020-03-18 ENCOUNTER — Other Ambulatory Visit: Payer: Self-pay

## 2020-03-18 ENCOUNTER — Encounter: Payer: Self-pay | Admitting: Family Medicine

## 2020-03-18 VITALS — BP 117/74 | HR 77 | Temp 98.0°F | Ht 70.0 in | Wt 228.2 lb

## 2020-03-18 DIAGNOSIS — E118 Type 2 diabetes mellitus with unspecified complications: Secondary | ICD-10-CM | POA: Diagnosis not present

## 2020-03-18 DIAGNOSIS — G4733 Obstructive sleep apnea (adult) (pediatric): Secondary | ICD-10-CM | POA: Insufficient documentation

## 2020-03-18 DIAGNOSIS — E1165 Type 2 diabetes mellitus with hyperglycemia: Secondary | ICD-10-CM | POA: Diagnosis not present

## 2020-03-18 DIAGNOSIS — I152 Hypertension secondary to endocrine disorders: Secondary | ICD-10-CM

## 2020-03-18 DIAGNOSIS — Z23 Encounter for immunization: Secondary | ICD-10-CM

## 2020-03-18 DIAGNOSIS — E1159 Type 2 diabetes mellitus with other circulatory complications: Secondary | ICD-10-CM | POA: Diagnosis not present

## 2020-03-18 DIAGNOSIS — F325 Major depressive disorder, single episode, in full remission: Secondary | ICD-10-CM

## 2020-03-18 LAB — POCT GLYCOSYLATED HEMOGLOBIN (HGB A1C): Hemoglobin A1C: 10.6 % — AB (ref 4.0–5.6)

## 2020-03-18 MED ORDER — OZEMPIC (1 MG/DOSE) 2 MG/1.5ML ~~LOC~~ SOPN: 1.0000 mg | PEN_INJECTOR | SUBCUTANEOUS | 3 refills | Status: DC

## 2020-03-18 MED FILL — OZEMPIC (1 MG/DOSE) 4 MG/3M: 4 | 28 days supply | Qty: 3 | Fill #0

## 2020-03-18 NOTE — Assessment & Plan Note (Signed)
At goal. Recently cut down on dose of losartan due to dizziness. Continue losartan 50mg  daily and metoprolol succnate 50mg  daily. Dizziness as resolved.

## 2020-03-18 NOTE — Progress Notes (Signed)
   Travis Owens is a 64 y.o. male who presents today for an office visit.  Assessment/Plan:  Chronic Problems Addressed Today: OSA treated with BiPAP Stable. Tolerating BiPAP well.   Type 2 diabetes mellitus with complication, without long-term current use of insulin ([name]) Uncontrolled at 10.6. Has had several dietary indiscretions. Will increase ozempic to 1mg  weekly. Continue metformin 168m bid. Recheck in 3  Months.   Hypertension associated with diabetes ([place]) At goal. Recently cut down on dose of losartan due to dizziness. Continue losartan 50mg  daily and metoprolol succnate 50mg  daily. Dizziness as resolved.   Depression, major, single episode, complete remission ([name]) Stable. Continue lexapro 20mg  daily.   Flu vaccine given today.     Subjective:  HPI:  See A/p.         Objective:  Physical Exam: BP 117/74   Pulse 77   Temp 98 F (36.7 C) (Temporal)   Ht 5\' 10"  (1.778 m)   Wt 228 lb 3.2 oz (103.5 kg)   SpO2 95%   BMI 32.74 kg/m   Wt Readings from Last 3 Encounters:  03/18/20 228 lb 3.2 oz (103.5 kg)  01/14/20 229 lb (103.9 kg)  01/02/20 223 lb (101.2 kg)  Gen: No acute distress, resting comfortably Neuro: Grossly normal, moves all extremities Psych: Normal affect and thought content      [employer] M. [name], MD 03/18/2020 8:42 AM

## 2020-03-18 NOTE — Assessment & Plan Note (Signed)
Uncontrolled at 10.6. Has had several dietary indiscretions. Will increase ozempic to 1mg  weekly. Continue metformin 171m bid. Recheck in 3  Months.

## 2020-03-18 NOTE — Assessment & Plan Note (Signed)
Stable. Tolerating BiPAP well.

## 2020-03-18 NOTE — Patient Instructions (Addendum)
It was very nice to see you today!  We will give you your flu vaccine today.   Please increase the ozempic to 1mg  weekly.  Please continue working on your diet and exercise.  I would like to see you back in 3 months or sooner as needed.   Take care, Dr [name]  Please try these tips to maintain a healthy lifestyle:   Eat at least 3 REAL meals and 1-2 snacks per day.  Aim for no more than 5 hours between eating.  If you eat breakfast, please do so within one hour of getting up.    Each meal should contain half fruits/vegetables, one quarter protein, and one quarter carbs (no bigger than a computer mouse)   Cut down on sweet beverages. This includes juice, soda, and sweet tea.     Drink at least 1 glass of water with each meal and aim for at least 8 glasses per day   Exercise at least 150 minutes every week.

## 2020-03-18 NOTE — Assessment & Plan Note (Signed)
Stable. Continue lexapro 20mg  daily.

## 2020-03-23 MED FILL — METOPROLOL SUCCINATE ER 100: 100 | 90 days supply | Qty: 90 | Fill #2

## 2020-04-16 MED FILL — ATORVASTATIN 80 MG TABLET: 80 | 90 days supply | Qty: 90 | Fill #3

## 2020-04-24 MED FILL — EZETIMIBE 10 MG TABS: 10 | 90 days supply | Qty: 90 | Fill #2

## 2020-04-24 MED FILL — PANTOPRAZOLE SOD DR 40 MG T: 40 | 90 days supply | Qty: 90 | Fill #2

## 2020-04-24 MED FILL — ESCITALOPRAM 20 MG TABLET: 20 | 90 days supply | Qty: 90 | Fill #2

## 2020-04-24 MED FILL — METFORMIN HCL 1000 MG TABS: 1000 | 90 days supply | Qty: 180 | Fill #2

## 2020-04-28 ENCOUNTER — Other Ambulatory Visit (INDEPENDENT_AMBULATORY_CARE_PROVIDER_SITE_OTHER): Payer: Self-pay | Admitting: Family Medicine

## 2020-04-28 ENCOUNTER — Encounter (INDEPENDENT_AMBULATORY_CARE_PROVIDER_SITE_OTHER): Payer: Self-pay | Admitting: Family Medicine

## 2020-04-28 ENCOUNTER — Other Ambulatory Visit: Payer: Self-pay

## 2020-04-28 ENCOUNTER — Telehealth (INDEPENDENT_AMBULATORY_CARE_PROVIDER_SITE_OTHER): Payer: 59 | Admitting: Family Medicine

## 2020-04-28 ENCOUNTER — Telehealth (INDEPENDENT_AMBULATORY_CARE_PROVIDER_SITE_OTHER): Payer: Self-pay

## 2020-04-28 DIAGNOSIS — Z6831 Body mass index (BMI) 31.0-31.9, adult: Secondary | ICD-10-CM | POA: Diagnosis not present

## 2020-04-28 DIAGNOSIS — E669 Obesity, unspecified: Secondary | ICD-10-CM

## 2020-04-28 DIAGNOSIS — E559 Vitamin D deficiency, unspecified: Secondary | ICD-10-CM | POA: Diagnosis not present

## 2020-04-28 DIAGNOSIS — E1165 Type 2 diabetes mellitus with hyperglycemia: Secondary | ICD-10-CM | POA: Diagnosis not present

## 2020-04-28 MED ORDER — VITAMIN D (ERGOCALCIFEROL) 1.25 MG (50000 UNIT) PO CAPS: 50000.0000 [IU] | ORAL_CAPSULE | ORAL | 0 refills | Status: DC

## 2020-04-28 NOTE — Telephone Encounter (Signed)
I connected with  Travis Owens on 04/28/20 by a video enabled telemedicine application and verified that I am speaking with the correct person using two identifiers.   I discussed the limitations of evaluation and management by telemedicine. The patient expressed understanding and agreed to proceed.

## 2020-04-29 NOTE — Progress Notes (Signed)
TeleHealth Visit:  Due to the COVID-19 pandemic, this visit was completed with telemedicine (audio/video) technology to reduce patient and provider exposure as well as to preserve personal protective equipment.   Travis Owens has verbally consented to this TeleHealth visit. The patient is located at home, the provider is located at the [facility] office. The participants in this visit include the listed provider and patient. The visit was conducted today via MyChart video.  Chief Complaint: OBESITY Travis Owens is here to discuss his progress with his obesity treatment plan along with follow-up of his obesity related diagnoses. Travis Owens is on practicing portion control and making smarter food choices, such as increasing vegetables and decreasing simple carbohydrates and states he is following his eating plan approximately 0% of the time. Travis Owens states he is not exercising regularly at this time.  Today's visit was #: 14 Starting weight: 220 lbs Starting date: 04/30/2019  Interim History: Travis Owens reports that he got his booster yesterday and is feeling the effects.  He has been doing quite a bit of home repairs and improvements.  His last appointment was at the end of August 2020.  He has been eating whatever he wants.  He wants to recommit to the plan.  He says he has some indulgent food in the house.  Subjective:   1. Vitamin D deficiency Travis Owens's Vitamin D level was 25.8 on 04/30/2019. He was previously on prescription vitamin D. He denies nausea, vomiting or muscle weakness.  He endorses fatigue.  2. Type 2 diabetes mellitus with hyperglycemia, without long-term current use of insulin ([name]) Last [name] was 10.6.  He is on Ozempic 1 mg weekly.  Denies feelings of hypoglycemia.  Lab Results  Component Value Date   HGBA1C 10.6 (A) 03/18/2020   HGBA1C 7.5 (A) 09/13/2019   HGBA1C 9.1 (A) 06/21/2019   Lab Results  Component Value Date   LDLCALC 57 09/13/2019   CREATININE 0.82 09/13/2019   Lab  Results  Component Value Date   INSULIN 24.2 04/30/2019   Assessment/Plan:   1. Vitamin D deficiency Low Vitamin D level contributes to fatigue and are associated with obesity, breast, and colon cancer. He agrees to continue to take prescription Vitamin D @50 ,000 IU every week and will follow-up for routine testing of Vitamin D, at least 2-3 times per year to avoid over-replacement.  -Refill Vitamin D, Ergocalciferol, (DRISDOL) 1.25 MG (50000 UNIT) CAPS capsule; Take 1 capsule (50,000 Units total) by mouth every 7 (seven) days.  Dispense: 4 capsule; Refill: 0  2. Type 2 diabetes mellitus with hyperglycemia, without long-term current use of insulin ([name]) Good blood sugar control is important to decrease the likelihood of diabetic complications such as nephropathy, neuropathy, limb loss, blindness, coronary artery disease, and death. Intensive lifestyle modification including diet, exercise and weight loss are the first line of treatment for diabetes.  Will follow-up at next appointment.   3. Class 1 obesity with serious comorbidity and body mass index (BMI) of 31.0 to 31.9 in adult, unspecified obesity type  Travis Owens is currently in the action stage of change. As such, his goal is to continue with weight loss efforts. He has agreed to the Category 4 Plan with 2 meals on plan x1 week, then 2 meals and snacks on plan x1 week.   Exercise goals: No exercise has been prescribed at this time.  Behavioral modification strategies: increasing lean protein intake, meal planning and cooking strategies, keeping healthy foods in the home and planning for success.  Travis Owens has agreed to follow-up with our clinic in 2 weeks. He was informed of the importance of frequent follow-up visits to maximize his success with intensive lifestyle modifications for his multiple health conditions.  Objective:   VITALS: Per patient if applicable, see vitals. GENERAL: Alert and in no acute distress. CARDIOPULMONARY: No  increased WOB. Speaking in clear sentences.  PSYCH: Pleasant and cooperative. Speech normal rate and rhythm. Affect is appropriate. Insight and judgement are appropriate. Attention is focused, linear, and appropriate.  NEURO: Oriented as arrived to appointment on time with no prompting.   Lab Results  Component Value Date   CREATININE 0.82 09/13/2019   BUN 17 09/13/2019   NA 136 09/13/2019   K 4.5 09/13/2019   CL 100 09/13/2019   CO2 31 09/13/2019   Lab Results  Component Value Date   ALT 26 09/13/2019   AST 23 09/13/2019   ALKPHOS 60 09/13/2019   BILITOT 0.6 09/13/2019   Lab Results  Component Value Date   HGBA1C 10.6 (A) 03/18/2020   HGBA1C 7.5 (A) 09/13/2019   HGBA1C 9.1 (A) 06/21/2019   HGBA1C 9.5 (H) 04/30/2019   HGBA1C 8.3 (A) 12/17/2018   Lab Results  Component Value Date   INSULIN 24.2 04/30/2019   Lab Results  Component Value Date   TSH 1.33 09/13/2019   Lab Results  Component Value Date   CHOL 139 09/13/2019   HDL 49.40 09/13/2019   LDLCALC 57 09/13/2019   TRIG 166.0 (H) 09/13/2019   CHOLHDL 3 09/13/2019   Lab Results  Component Value Date   WBC 8.7 09/13/2019   HGB 14.2 09/13/2019   HCT 42.7 09/13/2019   MCV 90.2 09/13/2019   PLT 276.0 09/13/2019   Attestation Statements:   Reviewed by clinician on day of visit: allergies, medications, problem list, medical history, surgical history, family history, social history, and previous encounter notes.  I, Water quality scientist, CMA, am acting as transcriptionist for Coralie Common, MD.  I have reviewed the above documentation for accuracy and completeness, and I agree with the above. - Jinny Blossom, MD

## 2020-05-01 MED FILL — OZEMPIC (1 MG/DOSE) 4 MG/3M: 4 | 28 days supply | Qty: 3 | Fill #1

## 2020-05-01 MED FILL — LOSARTAN POTASSIUM 100 MG T: 100 | 30 days supply | Qty: 30 | Fill #1

## 2020-05-12 ENCOUNTER — Telehealth (INDEPENDENT_AMBULATORY_CARE_PROVIDER_SITE_OTHER): Payer: Self-pay

## 2020-05-12 ENCOUNTER — Other Ambulatory Visit: Payer: Self-pay

## 2020-05-12 ENCOUNTER — Telehealth (INDEPENDENT_AMBULATORY_CARE_PROVIDER_SITE_OTHER): Payer: 59 | Admitting: Family Medicine

## 2020-05-25 DIAGNOSIS — G4733 Obstructive sleep apnea (adult) (pediatric): Secondary | ICD-10-CM | POA: Diagnosis not present

## 2020-05-26 ENCOUNTER — Encounter (INDEPENDENT_AMBULATORY_CARE_PROVIDER_SITE_OTHER): Payer: Self-pay | Admitting: Family Medicine

## 2020-05-26 ENCOUNTER — Telehealth (INDEPENDENT_AMBULATORY_CARE_PROVIDER_SITE_OTHER): Payer: 59 | Admitting: Family Medicine

## 2020-05-26 DIAGNOSIS — E669 Obesity, unspecified: Secondary | ICD-10-CM

## 2020-05-26 DIAGNOSIS — E1165 Type 2 diabetes mellitus with hyperglycemia: Secondary | ICD-10-CM

## 2020-05-26 DIAGNOSIS — I152 Hypertension secondary to endocrine disorders: Secondary | ICD-10-CM | POA: Diagnosis not present

## 2020-05-26 DIAGNOSIS — E1159 Type 2 diabetes mellitus with other circulatory complications: Secondary | ICD-10-CM

## 2020-05-26 DIAGNOSIS — Z6832 Body mass index (BMI) 32.0-32.9, adult: Secondary | ICD-10-CM | POA: Diagnosis not present

## 2020-05-27 NOTE — Progress Notes (Signed)
TeleHealth Visit:  Due to the COVID-19 pandemic, this visit was completed with telemedicine (audio/video) technology to reduce patient and provider exposure as well as to preserve personal protective equipment.   Travis Owens has verbally consented to this TeleHealth visit. The patient is located at home, the provider is located at the [facility] office. The participants in this visit include the listed provider and patient. The visit was conducted today via video.   Chief Complaint: OBESITY Travis Owens is here to discuss his progress with his obesity treatment plan along with follow-up of his obesity related diagnoses. Travis Owens is on the Category 4 Plan and states he is following his eating plan approximately 0% of the time. Travis Owens states he is doing 0 minutes 0 times per week.  Today's visit was #: 15 Starting weight: 220 lbs Starting date: 04/30/2019  Interim History: Last appointment was exposed but wife was negative for COVID. He had symptoms for headache, body aches, and feeling ill. Pt lost food plan, so he has not been able to follow. He is trying to use the next few weeks to get his house in order. He can go to the grocery store today or tomorrow.   Subjective:   1. Type 2 diabetes mellitus with hyperglycemia, without long-term current use of insulin ([name]) Last [name] 10.6. Pt is on Ozempic and Metformin. His blood sugar was 200 on last check. He is not checking his blood sugar very often.  2. Hypertension associated with diabetes ([place]) BP controlled previously.  Pt denies chest pain, chest pressure and headache.  Assessment/Plan:   1. Type 2 diabetes mellitus with hyperglycemia, without long-term current use of insulin ([name]) Good blood sugar control is important to decrease the likelihood of diabetic complications such as nephropathy, neuropathy, limb loss, blindness, coronary artery disease, and death. Intensive lifestyle modification including diet, exercise and weight loss are the  first line of treatment for diabetes. Follow up with PCP for [name] check. Consider addition of SGLT-2.  2. Hypertension associated with diabetes ([place]) Selvin is working on healthy weight loss and exercise to improve blood pressure control. We will watch for signs of hypotension as he continues his lifestyle modifications. Continue current meds of Losartan and Metoprolol. Follow up on BP at next appointment.   3. Class 1 obesity with serious comorbidity and body mass index (BMI) of 32.0 to 32.9 in adult, unspecified obesity type Travis Owens is currently in the action stage of change. As such, his goal is to continue with weight loss efforts. He has agreed to the Category 4 Plan.   Exercise goals: No exercise has been prescribed at this time.  Behavioral modification strategies: increasing lean protein intake, meal planning and cooking strategies, keeping healthy foods in the home and planning for success.  Travis Owens has agreed to follow-up with our clinic in 2 weeks. He was informed of the importance of frequent follow-up visits to maximize his success with intensive lifestyle modifications for his multiple health conditions.  Objective:   VITALS: Per patient if applicable, see vitals. GENERAL: Alert and in no acute distress. CARDIOPULMONARY: No increased WOB. Speaking in clear sentences.  PSYCH: Pleasant and cooperative. Speech normal rate and rhythm. Affect is appropriate. Insight and judgement are appropriate. Attention is focused, linear, and appropriate.  NEURO: Oriented as arrived to appointment on time with no prompting.   Lab Results  Component Value Date   CREATININE 0.82 09/13/2019   BUN 17 09/13/2019   NA 136 09/13/2019   K 4.5  09/13/2019   CL 100 09/13/2019   CO2 31 09/13/2019   Lab Results  Component Value Date   ALT 26 09/13/2019   AST 23 09/13/2019   ALKPHOS 60 09/13/2019   BILITOT 0.6 09/13/2019   Lab Results  Component Value Date   HGBA1C 10.6 (A) 03/18/2020   HGBA1C 7.5 (A)  09/13/2019   HGBA1C 9.1 (A) 06/21/2019   HGBA1C 9.5 (H) 04/30/2019   HGBA1C 8.3 (A) 12/17/2018   Lab Results  Component Value Date   INSULIN 24.2 04/30/2019   Lab Results  Component Value Date   TSH 1.33 09/13/2019   Lab Results  Component Value Date   CHOL 139 09/13/2019   HDL 49.40 09/13/2019   LDLCALC 57 09/13/2019   TRIG 166.0 (H) 09/13/2019   CHOLHDL 3 09/13/2019   Lab Results  Component Value Date   WBC 8.7 09/13/2019   HGB 14.2 09/13/2019   HCT 42.7 09/13/2019   MCV 90.2 09/13/2019   PLT 276.0 09/13/2019   No results found for: IRON, TIBC, FERRITIN  Attestation Statements:   Reviewed by clinician on day of visit: allergies, medications, problem list, medical history, surgical history, family history, social history, and previous encounter notes.  Coral Ceo, am acting as transcriptionist for Coralie Common, MD.   I have reviewed the above documentation for accuracy and completeness, and I agree with the above. - Jinny Blossom, MD

## 2020-06-01 MED FILL — OZEMPIC (1 MG/DOSE) 4 MG/3M: 4 | 28 days supply | Qty: 3 | Fill #2

## 2020-06-09 ENCOUNTER — Other Ambulatory Visit: Payer: Self-pay

## 2020-06-09 ENCOUNTER — Ambulatory Visit (INDEPENDENT_AMBULATORY_CARE_PROVIDER_SITE_OTHER): Payer: 59 | Admitting: Family Medicine

## 2020-06-09 ENCOUNTER — Encounter (INDEPENDENT_AMBULATORY_CARE_PROVIDER_SITE_OTHER): Payer: Self-pay | Admitting: Family Medicine

## 2020-06-09 VITALS — BP 135/75 | HR 79 | Temp 98.5°F | Ht 70.0 in | Wt 218.0 lb

## 2020-06-09 DIAGNOSIS — E669 Obesity, unspecified: Secondary | ICD-10-CM | POA: Diagnosis not present

## 2020-06-09 DIAGNOSIS — E1159 Type 2 diabetes mellitus with other circulatory complications: Secondary | ICD-10-CM

## 2020-06-09 DIAGNOSIS — Z6831 Body mass index (BMI) 31.0-31.9, adult: Secondary | ICD-10-CM

## 2020-06-09 DIAGNOSIS — E1165 Type 2 diabetes mellitus with hyperglycemia: Secondary | ICD-10-CM

## 2020-06-09 DIAGNOSIS — I152 Hypertension secondary to endocrine disorders: Secondary | ICD-10-CM

## 2020-06-13 DIAGNOSIS — G4733 Obstructive sleep apnea (adult) (pediatric): Secondary | ICD-10-CM | POA: Diagnosis not present

## 2020-06-16 NOTE — Progress Notes (Unsigned)
Chief Complaint:   OBESITY Travis Owens is here to discuss his progress with his obesity treatment plan along with follow-up of his obesity related diagnoses. Travis Owens is on the Category 4 Plan and states he is following his eating plan approximately 75% of the time. Travis Owens states he is walking 20 minutes 2 times per week.  Today's visit was #: 75 Starting weight: 220 lbs Starting date: 04/30/2019 Today's weight: 218 lbs Today's date: 06/09/2020 Total lbs lost to date: 2 lbs Total lbs lost since last in-office visit: 2 lbs  Interim History: Travis Owens likes the meal plan. He likes the food and quantity of food. He feels full on plan. Travis Owens doesn't see any obstacles in the next few weeks. He started walking again, which has gotten him outside for a bit.  Subjective:   1. Hypertension associated with diabetes (Travis Owens) Travis Owens's BP is well controlled.  Pt denies chest pain, chest pressure and headache. He is on Cozaar and Toprol.  BP Readings from Last 3 Encounters:  06/09/20 135/75  03/18/20 117/74  01/14/20 122/76    2. Type 2 diabetes mellitus with hyperglycemia, without long-term current use of insulin ([name]) Travis Owens denies feelings of hypoglycemia. He is on Metformin and Ozempic.  Lab Results  Component Value Date   HGBA1C 10.6 (A) 03/18/2020   HGBA1C 7.5 (A) 09/13/2019   HGBA1C 9.1 (A) 06/21/2019   Lab Results  Component Value Date   LDLCALC 57 09/13/2019   CREATININE 0.82 09/13/2019   Lab Results  Component Value Date   INSULIN 24.2 04/30/2019    Assessment/Plan:   1. Hypertension associated with diabetes (Travis Owens) Travis Owens is working on healthy weight loss and exercise to improve blood pressure control. We will watch for signs of hypotension as he continues his lifestyle modifications. Continue current treatment plan.  2. Type 2 diabetes mellitus with hyperglycemia, without long-term current use of insulin ([name]) Good blood sugar control is important to decrease the likelihood of diabetic  complications such as nephropathy, neuropathy, limb loss, blindness, coronary artery disease, and death. Intensive lifestyle modification including diet, exercise and weight loss are the first line of treatment for diabetes. Continue current meds and Category 4 meal plan.  3. Class 1 obesity with serious comorbidity and body mass index (BMI) of 31.0 to 31.9 in adult, unspecified obesity type Travis Owens is currently in the action stage of change. As such, his goal is to continue with weight loss efforts. He has agreed to the Category 4 Plan.   Exercise goals: All adults should avoid inactivity. Some physical activity is better than none, and adults who participate in any amount of physical activity gain some health benefits.  Behavioral modification strategies: increasing lean protein intake, increasing vegetables, meal planning and cooking strategies and keeping healthy foods in the home.  Travis Owens has agreed to follow-up with our clinic in 2-3 weeks. He was informed of the importance of frequent follow-up visits to maximize his success with intensive lifestyle modifications for his multiple health conditions.   Objective:   Blood pressure 135/75, pulse 79, temperature 98.5 F (36.9 C), temperature source Oral, height 5\' 10"  (1.778 m), weight 218 lb (98.9 kg), SpO2 97 %. Body mass index is 31.28 kg/m.  General: Cooperative, alert, well developed, in no acute distress. HEENT: Conjunctivae and lids unremarkable. Cardiovascular: Regular rhythm.  Lungs: Normal work of breathing. Neurologic: No focal deficits.   Lab Results  Component Value Date   CREATININE 0.82 09/13/2019   BUN 17 09/13/2019   NA  136 09/13/2019   K 4.5 09/13/2019   CL 100 09/13/2019   CO2 31 09/13/2019   Lab Results  Component Value Date   ALT 26 09/13/2019   AST 23 09/13/2019   ALKPHOS 60 09/13/2019   BILITOT 0.6 09/13/2019   Lab Results  Component Value Date   HGBA1C 10.6 (A) 03/18/2020   HGBA1C 7.5 (A) 09/13/2019    HGBA1C 9.1 (A) 06/21/2019   HGBA1C 9.5 (H) 04/30/2019   HGBA1C 8.3 (A) 12/17/2018   Lab Results  Component Value Date   INSULIN 24.2 04/30/2019   Lab Results  Component Value Date   TSH 1.33 09/13/2019   Lab Results  Component Value Date   CHOL 139 09/13/2019   HDL 49.40 09/13/2019   LDLCALC 57 09/13/2019   TRIG 166.0 (H) 09/13/2019   CHOLHDL 3 09/13/2019   Lab Results  Component Value Date   WBC 8.7 09/13/2019   HGB 14.2 09/13/2019   HCT 42.7 09/13/2019   MCV 90.2 09/13/2019   PLT 276.0 09/13/2019     Attestation Statements:   Reviewed by clinician on day of visit: allergies, medications, problem list, medical history, surgical history, family history, social history, and previous encounter notes.  Time spent on visit including pre-visit chart review and post-visit care and charting was 15 minutes.   Coral Ceo, am acting as transcriptionist for Coralie Common, MD.   I have reviewed the above documentation for accuracy and completeness, and I agree with the above. - Jinny Blossom, MD

## 2020-06-18 ENCOUNTER — Telehealth: Payer: Self-pay

## 2020-06-18 ENCOUNTER — Encounter: Payer: Self-pay | Admitting: Family Medicine

## 2020-06-18 ENCOUNTER — Ambulatory Visit: Payer: 59 | Admitting: Family Medicine

## 2020-06-18 ENCOUNTER — Other Ambulatory Visit: Payer: Self-pay

## 2020-06-18 VITALS — BP 118/77 | HR 79 | Temp 98.0°F | Ht 70.0 in | Wt 225.8 lb

## 2020-06-18 DIAGNOSIS — E1159 Type 2 diabetes mellitus with other circulatory complications: Secondary | ICD-10-CM

## 2020-06-18 DIAGNOSIS — I152 Hypertension secondary to endocrine disorders: Secondary | ICD-10-CM

## 2020-06-18 DIAGNOSIS — E118 Type 2 diabetes mellitus with unspecified complications: Secondary | ICD-10-CM

## 2020-06-18 LAB — POCT GLYCOSYLATED HEMOGLOBIN (HGB A1C): Hemoglobin A1C: 10.7 % — AB (ref 4.0–5.6)

## 2020-06-18 MED ORDER — OZEMPIC (1 MG/DOSE) 4 MG/3ML ~~LOC~~ SOPN: 2.0000 mg | PEN_INJECTOR | SUBCUTANEOUS | 3 refills | Status: DC

## 2020-06-18 NOTE — Telephone Encounter (Signed)
Please let patient know insurance will not pay for higher dose. WE can send in an alternative oral med or we can continue current meds and recheck in 3 months.  [name]. [name], MD 06/18/2020 3:50 PM

## 2020-06-18 NOTE — Assessment & Plan Note (Signed)
[name] uncontrolled at 10.7.  He is working on lifestyle modifications and weight loss.  We will increase ozempic to 2mg  weekly. Hopefully this should help some with his weight loss as well.  Discussed possibility of side effects at higher dose.  Also discussed addition of SGLT2 inhibitor however he recently stopped Jardiance about a year ago due to concerns over side effects.  Will continue Metformin 1000mg  twice daily.  Follow-up [name] in 3 months.  May consider combo medication such as Invokamet if [name] continues to be uncontrolled.  May need basal insulin at some point in the future.  Hopefully will not need this as he continues to work on weight loss.

## 2020-06-18 NOTE — Telephone Encounter (Signed)
Please advise 

## 2020-06-18 NOTE — Progress Notes (Signed)
   Travis Owens is a 65 y.o. male who presents today for an office visit.  Assessment/Plan:  Chronic Problems Addressed Today: Type 2 diabetes mellitus with complication, without long-term current use of insulin ([name]) [name] uncontrolled at 10.7.  He is working on lifestyle modifications and weight loss.  We will increase ozempic to 2mg  weekly. Hopefully this should help some with his weight loss as well.  Discussed possibility of side effects at higher dose.  Also discussed addition of SGLT2 inhibitor however he recently stopped Jardiance about a year ago due to concerns over side effects.  Will continue Metformin 1000mg  twice daily.  Follow-up [name] in 3 months.  May consider combo medication such as Invokamet if [name] continues to be uncontrolled.  May need basal insulin at some point in the future.  Hopefully will not need this as he continues to work on weight loss.      Subjective:  HPI:  See A/p.         Objective:  Physical Exam: BP 118/77   Pulse 79   Temp 98 F (36.7 C) (Temporal)   Ht 5\' 10"  (1.778 m)   Wt 225 lb 12.8 oz (102.4 kg)   SpO2 99%   BMI 32.40 kg/m   Wt Readings from Last 3 Encounters:  06/18/20 225 lb 12.8 oz (102.4 kg)  06/09/20 218 lb (98.9 kg)  03/18/20 228 lb 3.2 oz (103.5 kg)    Gen: No acute distress, resting comfortably Neuro: Grossly normal, moves all extremities Psych: Normal affect and thought content      [name] M. [name], MD 06/18/2020 8:52 AM

## 2020-06-18 NOTE — Telephone Encounter (Signed)
Travis Owens is calling in asking for clarification on the Semaglutide, 1 MG/DOSE, (OZEMPIC, 1 MG/DOSE,) 4 MG/3ML SOPN. It states to inject 2MG  but at this time Ozempic can only be accepted by insurance with injection of 1MG , the pharmacist states that in the near future Ozempic is going to have a 2MG  injection but that is not until later this year.

## 2020-06-18 NOTE — Patient Instructions (Addendum)
It was very nice to see you today!  Please increase your ozempic to 2mg  weekly.   Please continue working on your diet and exercise.  I will see you back in 3 months to recheck your [name].  Please come back to see me sooner if needed.  Take care, Dr [name]  Please try these tips to maintain a healthy lifestyle:   Eat at least 3 REAL meals and 1-2 snacks per day.  Aim for no more than 5 hours between eating.  If you eat breakfast, please do so within one hour of getting up.    Each meal should contain half fruits/vegetables, one quarter protein, and one quarter carbs (no bigger than a computer mouse)   Cut down on sweet beverages. This includes juice, soda, and sweet tea.     Drink at least 1 glass of water with each meal and aim for at least 8 glasses per day   Exercise at least 150 minutes every week.

## 2020-06-22 ENCOUNTER — Telehealth: Payer: Self-pay

## 2020-06-22 NOTE — Telephone Encounter (Signed)
[place] is requesting a call in regards to clarification on the Ozempic prescription sent in

## 2020-06-23 ENCOUNTER — Other Ambulatory Visit: Payer: Self-pay

## 2020-06-23 ENCOUNTER — Ambulatory Visit (INDEPENDENT_AMBULATORY_CARE_PROVIDER_SITE_OTHER): Payer: 59 | Admitting: Family Medicine

## 2020-06-23 ENCOUNTER — Encounter (INDEPENDENT_AMBULATORY_CARE_PROVIDER_SITE_OTHER): Payer: Self-pay | Admitting: Family Medicine

## 2020-06-23 ENCOUNTER — Other Ambulatory Visit (INDEPENDENT_AMBULATORY_CARE_PROVIDER_SITE_OTHER): Payer: Self-pay | Admitting: Family Medicine

## 2020-06-23 VITALS — BP 105/67 | HR 79 | Temp 98.1°F | Ht 70.0 in | Wt 218.0 lb

## 2020-06-23 DIAGNOSIS — Z9189 Other specified personal risk factors, not elsewhere classified: Secondary | ICD-10-CM

## 2020-06-23 DIAGNOSIS — E669 Obesity, unspecified: Secondary | ICD-10-CM

## 2020-06-23 DIAGNOSIS — E1165 Type 2 diabetes mellitus with hyperglycemia: Secondary | ICD-10-CM

## 2020-06-23 DIAGNOSIS — E559 Vitamin D deficiency, unspecified: Secondary | ICD-10-CM | POA: Diagnosis not present

## 2020-06-23 DIAGNOSIS — Z6831 Body mass index (BMI) 31.0-31.9, adult: Secondary | ICD-10-CM | POA: Diagnosis not present

## 2020-06-23 MED ORDER — WEGOVY 1.7 MG/0.75ML ~~LOC~~ SOAJ: 1.7000 mg | SUBCUTANEOUS | 0 refills | Status: DC

## 2020-06-23 NOTE — Telephone Encounter (Signed)
This was already addressed last week. Please see phone note from last week.  [name]. [name], MD 06/23/2020 8:49 AM

## 2020-06-23 NOTE — Telephone Encounter (Signed)
With the Ozempic RX that you called in will only be a 14 day supply and will not be covered by insurance. The highest strength is the 1mg  dosage and that's a 28 day supply. Please advise.

## 2020-06-23 NOTE — Telephone Encounter (Signed)
Patient states that he wants to continue on the current medications that he's on and recheck in 3 months.

## 2020-06-24 ENCOUNTER — Encounter (INDEPENDENT_AMBULATORY_CARE_PROVIDER_SITE_OTHER): Payer: Self-pay | Admitting: Family Medicine

## 2020-06-24 DIAGNOSIS — Z6831 Body mass index (BMI) 31.0-31.9, adult: Secondary | ICD-10-CM | POA: Insufficient documentation

## 2020-06-24 DIAGNOSIS — E559 Vitamin D deficiency, unspecified: Secondary | ICD-10-CM | POA: Insufficient documentation

## 2020-06-24 DIAGNOSIS — E669 Obesity, unspecified: Secondary | ICD-10-CM | POA: Insufficient documentation

## 2020-06-24 NOTE — Progress Notes (Signed)
Chief Complaint:   OBESITY Keith is here to discuss his progress with his obesity treatment plan along with follow-up of his obesity related diagnoses. Siraj is on the Category 4 Plan and states he is following his eating plan approximately 75% of the time. Renne states he is walking for 20 minutes 1-3 times per week.  Today's visit was #: 40 Starting weight: 220 lbs Starting date: 04/30/2019 Today's weight: 218 lbs Today's date: 06/23/2020 Total lbs lost to date: 2 lbs Total lbs lost since last in-office visit: 0  Interim History: Kees says snacking at night is an issue for him.  He feels his meals are on plan.  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 These are very tempting to him and he generally indulges.  Hunger is satisfied.  He notes he was off plan over Christmas and is working on getting back on plan.  Subjective:   1. Type 2 diabetes mellitus with hyperglycemia, without long-term current use of insulin ([name]) Not well controlled.  [name] 10.7.  He is on Ozempic 1 mg, but his PCP wants to increase the dose to 2 mg.  Insurance will not pay for this.  His is also on metformin 1,000 mg twice daily.  Denies hypoglycemia. His PCP mentioned insulin as a possibility and Javone notes this is an incentive for him to do better with his diet because he does not want to start insulin.   Lab Results  Component Value Date   HGBA1C 10.7 (A) 06/18/2020   HGBA1C 10.6 (A) 03/18/2020   HGBA1C 7.5 (A) 09/13/2019   Lab Results  Component Value Date   LDLCALC 57 09/13/2019   CREATININE 0.82 09/13/2019   Lab Results  Component Value Date   INSULIN 24.2 04/30/2019   2. Vitamin D deficiency Vitamin D is low at 25.8.  On weekly prescription vitamin D.  3. At risk for side effect of medication Tanner is at risk for side effect of medication due to increasing his dose of semaglutide.   Assessment/Plan:   1. Type 2 diabetes mellitus with hyperglycemia, without  long-term current use of insulin [facility]) New prescription for Wegovy 1.7 mg subcutaneously weekly has been sent to pharmacy.  Continue metformin.  2. Vitamin D deficiency Continue prescription vitamin D.  Will check vitamin D level within the next month or two.  3. At risk for side effect of medication Allard was given approximately 15 minutes of drug side effect counseling today.  We discussed side effect possibility and risk versus benefits. Peng agreed to the medication and will contact this office if these side effects are intolerable.  Repetitive spaced learning was employed today to elicit superior memory formation and behavioral change.  4. Class 1 obesity with serious comorbidity and body mass index (BMI) of 31.0 to 31.9 in adult, unspecified obesity type  - Start Semaglutide-Weight Management (WEGOVY) 1.7 MG/0.75ML SOAJ; Inject 1.7 mg into the skin once a week.  Dispense: 3 mL; Refill: 0  Shabazz is currently in the action stage of change. As such, his goal is to continue with weight loss efforts. He has agreed to the Category 4 Plan.   Discussed talking with parishioners about bringing sweets as gifts.    Discussed keeping home environment clear of sweets/simple carbs.  Exercise goals: As is.  Behavioral modification strategies: decreasing simple carbohydrates and dealing with family or coworker sabotage.  Bane has agreed to follow-up with our clinic in 2-3  weeks with Dr. Jearld Shines or me.  Objective:   Blood pressure 105/67, pulse 79, temperature 98.1 F (36.7 C), temperature source Oral, height 5\' 10"  (1.778 m), weight 218 lb (98.9 kg), SpO2 97 %. Body mass index is 31.28 kg/m.  General: Cooperative, alert, well developed, in no acute distress. HEENT: Conjunctivae and lids unremarkable. Cardiovascular: Regular rhythm.  Lungs: Normal work of breathing. Neurologic: No focal deficits.   Lab Results  Component Value Date   CREATININE 0.82 09/13/2019   BUN 17 09/13/2019   NA  136 09/13/2019   K 4.5 09/13/2019   CL 100 09/13/2019   CO2 31 09/13/2019   Lab Results  Component Value Date   ALT 26 09/13/2019   AST 23 09/13/2019   ALKPHOS 60 09/13/2019   BILITOT 0.6 09/13/2019   Lab Results  Component Value Date   HGBA1C 10.7 (A) 06/18/2020   HGBA1C 10.6 (A) 03/18/2020   HGBA1C 7.5 (A) 09/13/2019   HGBA1C 9.1 (A) 06/21/2019   HGBA1C 9.5 (H) 04/30/2019   Lab Results  Component Value Date   INSULIN 24.2 04/30/2019   Lab Results  Component Value Date   TSH 1.33 09/13/2019   Lab Results  Component Value Date   CHOL 139 09/13/2019   HDL 49.40 09/13/2019   LDLCALC 57 09/13/2019   TRIG 166.0 (H) 09/13/2019   CHOLHDL 3 09/13/2019   Lab Results  Component Value Date   WBC 8.7 09/13/2019   HGB 14.2 09/13/2019   HCT 42.7 09/13/2019   MCV 90.2 09/13/2019   PLT 276.0 09/13/2019   Attestation Statements:   Reviewed by clinician on day of visit: allergies, medications, problem list, medical history, surgical history, family history, social history, and previous encounter notes.  I, Water quality scientist, CMA, am acting as Location manager for [employer], [place].  I have reviewed the above documentation for accuracy and completeness, and I agree with the above. -  Georgianne Fick, FNP

## 2020-06-29 ENCOUNTER — Telehealth (INDEPENDENT_AMBULATORY_CARE_PROVIDER_SITE_OTHER): Payer: Self-pay

## 2020-06-29 MED FILL — METOPROLOL SUCCINATE ER 100: 100 | 90 days supply | Qty: 90 | Fill #3

## 2020-06-29 NOTE — Telephone Encounter (Signed)
Last OV with Dawn 

## 2020-06-29 NOTE — Telephone Encounter (Signed)
Pt called stating that his pharmacy [facility] outpatient pharmacy needs prior auth. On his WEGOVY, please give pharmacy a call at [phone]

## 2020-06-30 ENCOUNTER — Encounter (INDEPENDENT_AMBULATORY_CARE_PROVIDER_SITE_OTHER): Payer: Self-pay

## 2020-06-30 NOTE — Telephone Encounter (Signed)
Prior auth submitted

## 2020-07-01 ENCOUNTER — Other Ambulatory Visit (INDEPENDENT_AMBULATORY_CARE_PROVIDER_SITE_OTHER): Payer: Self-pay

## 2020-07-02 ENCOUNTER — Telehealth (INDEPENDENT_AMBULATORY_CARE_PROVIDER_SITE_OTHER): Payer: Self-pay

## 2020-07-02 NOTE — Telephone Encounter (Signed)
Pt's wife called in and stated that she was calling in on behave of her husband. Mrs.Bleicher said that her husband is out of his blood pressure meds. I informed the pt that she could send a mychart message, she stated that she wants to talk to [facility] directly. I informed Mrs. Manrique that I will send a message to the nurse. Please advise

## 2020-07-02 NOTE — Telephone Encounter (Signed)
Patient called back regarding authorization for [facility]. He said it is time for a new prescription because he is out of medication. Last seen [place]. He is aware office is closed Friday.

## 2020-07-02 NOTE — Telephone Encounter (Signed)
Spoke with patient regarding medication. Pt stated that the question was supposed to be about his Wegovy. Pt was advised that we will resubmit the PA once it has ben 30 days since his last paid claim on the medication the [facility] is to replace. Pt verbalized understanding and states he will send a mychart message once it has been 30 days to request the new PA on Wegovy. Pt stated no need to contact his wife he will discuss our conversation with her himself.

## 2020-07-03 ENCOUNTER — Encounter (INDEPENDENT_AMBULATORY_CARE_PROVIDER_SITE_OTHER): Payer: Self-pay | Admitting: Family Medicine

## 2020-07-06 ENCOUNTER — Encounter (INDEPENDENT_AMBULATORY_CARE_PROVIDER_SITE_OTHER): Payer: Self-pay

## 2020-07-06 ENCOUNTER — Ambulatory Visit (INDEPENDENT_AMBULATORY_CARE_PROVIDER_SITE_OTHER): Payer: 59 | Admitting: Family Medicine

## 2020-07-06 NOTE — Telephone Encounter (Signed)
My chart message sent to pt.

## 2020-07-08 ENCOUNTER — Ambulatory Visit: Payer: 59 | Admitting: Cardiovascular Disease

## 2020-07-08 ENCOUNTER — Encounter: Payer: Self-pay | Admitting: Cardiovascular Disease

## 2020-07-08 ENCOUNTER — Other Ambulatory Visit: Payer: Self-pay

## 2020-07-08 VITALS — BP 120/78 | HR 83 | Ht 70.0 in | Wt 225.0 lb

## 2020-07-08 DIAGNOSIS — I5042 Chronic combined systolic (congestive) and diastolic (congestive) heart failure: Secondary | ICD-10-CM

## 2020-07-08 DIAGNOSIS — Z9989 Dependence on other enabling machines and devices: Secondary | ICD-10-CM | POA: Diagnosis not present

## 2020-07-08 DIAGNOSIS — E118 Type 2 diabetes mellitus with unspecified complications: Secondary | ICD-10-CM

## 2020-07-08 DIAGNOSIS — I10 Essential (primary) hypertension: Secondary | ICD-10-CM | POA: Diagnosis not present

## 2020-07-08 DIAGNOSIS — E785 Hyperlipidemia, unspecified: Secondary | ICD-10-CM

## 2020-07-08 DIAGNOSIS — G4733 Obstructive sleep apnea (adult) (pediatric): Secondary | ICD-10-CM | POA: Diagnosis not present

## 2020-07-08 NOTE — Patient Instructions (Signed)
Medication Instructions:  Continue current medications  *If you need a refill on your cardiac medications before your next appointment, please call your pharmacy*   Lab Work: None Ordered   Testing/Procedures: None Ordered   Follow-Up: At [facility], you and your health needs are our priority.  As part of our continuing mission to provide you with exceptional heart care, we have created designated Provider Care Teams.  These Care Teams include your primary Cardiologist (physician) and Advanced Practice Providers (APPs -  Physician Assistants and Nurse Practitioners) who all work together to provide you with the care you need, when you need it.  We recommend signing up for the patient portal called "MyChart".  Sign up information is provided on this After Visit Summary.  MyChart is used to connect with patients for Virtual Visits (Telemedicine).  Patients are able to view lab/test results, encounter notes, upcoming appointments, etc.  Non-urgent messages can be sent to your provider as well.   To learn more about what you can do with MyChart, go to [email or web address]://www.mychart.com.    Your next appointment:   1 year(s)  The format for your next appointment:   In Person  Provider:   You may see Thomas Kelly, MD or one of the following Advanced Practice Providers on your designated Care Team:   Hao Meng, PA-C Angela Duke, PA-C or  Krista Kroeger, PA-C     

## 2020-07-11 DIAGNOSIS — G4733 Obstructive sleep apnea (adult) (pediatric): Secondary | ICD-10-CM | POA: Diagnosis not present

## 2020-07-13 ENCOUNTER — Encounter: Payer: Self-pay | Admitting: Cardiovascular Disease

## 2020-07-13 MED FILL — WEGOVY 1.7 MG/0.75ML SOAJ: 1.7 | 28 days supply | Qty: 3 | Fill #0

## 2020-07-13 NOTE — Progress Notes (Unsigned)
Cardiology Office Note:    Date:  07/14/2020   ID:  Travis Owens, DOB 18-Oct-1955, MRN [id number]  PCP:  Travis Barrack, MD  Cardiologist:  Travis Heinz, MD  Electrophysiologist:  None   Referring MD: Travis Barrack, MD   Chief Complaint  Patient presents with  . Congestive Heart Failure    History of Present Illness:    Travis Owens is a 65 y.o. male with a hx of chronic combined systolic and diastolic heart failure, type 2 diabetes, hypertension, hyperlipidemia, OSA who present for follow-up.  He was is referred by Dr. Leafy Owens for evaluation of left bundle branch block on 05/01/18.  He was referred to Dr. Leafy Owens for weight loss evaluation, an EKG was done which showed left bundle branch block.  TTE was done on 05/03/2019 and showed EF 40 to 45%, with marked marked septal-lateral dyssynchrony consistent with LBBB.  Coronary CTA on 06/06/2019 showed nonobstructive CAD (calcified plaque in the RPDA causing mild (25-49%) stenosis and calcified plaque in the proximal LAD and proximal RCA causing minimal (0-24%) stenosis).  Calcium score 111 (62nd percentile for age/gender).  CMR on 07/25/2019 showed EF 45%, no LGE, RV EF 37%.  Since last clinic visit, he reports that he has been doing well.  Denies any chest pain, dyspnea, lightheadedness, syncope, lower extremity edema, or palpitations.  Reports has been compliant with his BiPAP.  Has been walking couple times a week for 20 minutes, denies any exertional symptoms.   BP Readings from Last 3 Encounters:  07/14/20 118/68  07/14/20 115/74  07/08/20 120/78     Wt Readings from Last 3 Encounters:  07/14/20 222 lb (100.7 kg)  07/14/20 216 lb (98 kg)  07/08/20 225 lb (102.1 kg)      Past Medical History:  Diagnosis Date  . Anxiety   . Chest pain   . Depression   . Diabetes mellitus without complication ([place])   . Fatty liver   . GERD (gastroesophageal reflux disease)   . Hyperlipidemia   . Hypertension   . Sleep apnea     has CPAP/does not use    Past Surgical History:  Procedure Laterality Date  . COLONOSCOPY     Dr. Olevia Owens  2008    Current Medications: Current Meds  Medication Sig  . aspirin EC 81 MG tablet Take 81 mg by mouth daily.  Marland Kitchen atorvastatin (LIPITOR) 80 MG tablet Take 1 tablet (80 mg total) by mouth daily at 6 PM.  . Blood Glucose Monitoring Suppl (FREESTYLE LITE) DEVI Check Blood sugar twice daily  . escitalopram (LEXAPRO) 20 MG tablet Take 1 tablet (20 mg total) by mouth daily.  Marland Kitchen ezetimibe (ZETIA) 10 MG tablet Take 1 tablet (10 mg total) by mouth daily.  Marland Kitchen glucose blood (FREESTYLE LITE) test strip Check Blood sugar twice daily  . Lancets (FREESTYLE) lancets Check blood sugar twice daily  . metFORMIN (GLUCOPHAGE) 1000 MG tablet Take 1 tablet (1,000 mg total) by mouth 2 (two) times daily.  . metoprolol succinate (TOPROL-XL) 100 MG 24 hr tablet Take 100 mg by mouth daily.  . Multiple Vitamin (MULTIVITAMIN) tablet Take 1 tablet by mouth daily.  . pantoprazole (PROTONIX) 40 MG tablet Take 1 tablet (40 mg total) by mouth daily.  . sacubitril-valsartan (ENTRESTO) 24-26 MG Take 1 tablet by mouth 2 (two) times daily.  . Semaglutide-Weight Management (WEGOVY) 1.7 MG/0.75ML SOAJ Inject 1.7 mg into the skin once a week.  . Vitamin D, Ergocalciferol, (DRISDOL) 1.25 MG (  50000 UNIT) CAPS capsule Take 1 capsule (50,000 Units total) by mouth every 7 (seven) days. (Patient taking differently: Take 50,000 Units by mouth once a week.)  . [DISCONTINUED] losartan (COZAAR) 100 MG tablet Take 0.5 tablets (50 mg total) by mouth daily.     Allergies:   Patient has no known allergies.   Social History   Socioeconomic History  . Marital status: Married    Spouse name: Travis Owens  . Number of children: Not on file  . Years of education: Not on file  . Highest education level: Not on file  Occupational History  . Occupation: Theme park manager    Comment: [employer]  Tobacco Use  . Smoking status:  Never Smoker  . Smokeless tobacco: Never Used  Vaping Use  . Vaping Use: Never used  Substance and Sexual Activity  . Alcohol use: No  . Drug use: No  . Sexual activity: Yes  Other Topics Concern  . Not on file  Social History Narrative  . Not on file   Social Determinants of Health   Financial Resource Strain: Not on file  Food Insecurity: Not on file  Transportation Needs: Not on file  Physical Activity: Not on file  Stress: Not on file  Social Connections: Not on file     Family History: The patient's family history includes Cancer in his father; Hypertension in his paternal grandmother; Stroke in his father and paternal grandfather. There is no history of Colon cancer, Colon polyps, Esophageal cancer, Rectal cancer, or Stomach cancer.  ROS:   Please see the history of present illness.     All other systems reviewed and are negative.  EKGs/Labs/Other Studies Reviewed:    The following studies were reviewed today:   EKG:  EKG is ordered today.  The last ekg demonstrates normal sinus rhythm, rate 86, left bundle branch block  TTE 05/03/19:  1. Left ventricular ejection fraction, by visual estimation, is 40 to 45%. The left ventricle has mild to moderately decreased function. There is mildly increased left ventricular hypertrophy. There is marked septal-lateral dyssynchrony consistent with  LBBB.  2. Left ventricular diastolic parameters are consistent with Grade I diastolic dysfunction (impaired relaxation).  3. Global right ventricle has normal systolic function.The right ventricular size is normal. No increase in right ventricular wall thickness.  4. Left atrial size was normal.  5. Right atrial size was normal.  6. The mitral valve is normal in structure. No evidence of mitral valve regurgitation. No evidence of mitral stenosis.  7. The tricuspid valve is normal in structure.  8. The aortic valve is tricuspid. Aortic valve regurgitation is not visualized. Mild aortic  valve sclerosis without stenosis  9. TR signal is inadequate for assessing pulmonary artery systolic pressure. 10. The inferior vena cava is normal in size with greater than 50% respiratory variability, suggesting right atrial pressure of 3 mmHg.  Coronry CTA 06/06/19: 1. Coronary calcium score of 111. This was 5 percentile for age and sex matched control.  2. Normal coronary origin with right dominance.  3. Nonobstructive CAD. There is calcified plaque in the RPDA causing mild (25-49%) stenosis and calcified plaque in the proximal LAD and proximal RCA causing minimal (0-24%) stenosis  CAD-RADS 2. Mild non-obstructive CAD (25-49%). Consider non-atherosclerotic causes of chest pain. Consider preventive therapy and risk factor modification.1. Coronary calcium score of 111. This was 63 percentile for age and sex matched control.  2. Normal coronary origin with right dominance.  3. Nonobstructive CAD. There  is calcified plaque in the RPDA causing mild (25-49%) stenosis and calcified plaque in the proximal LAD and proximal RCA causing minimal (0-24%) stenosis  CAD-RADS 2. Mild non-obstructive CAD (25-49%). Consider non-atherosclerotic causes of chest pain. Consider preventive therapy and risk factor modification.  Noncardiac: IMPRESSION: No significant noncardiac findings. Incidental 6 mm densely calcified granuloma in the lingula.  CMR 07/25/19: 1. Normal LV size and thickness with markedly abnormal septal motion EF 45%. 2.  Possible LV non compaction seen best in SA/3 chamber views 3. No delayed gadolinium uptake, scar, infiltration, infarct in LV myocardium 4.  Basal RV dilatation and hypokinesis RVEF 37% 5   normal MV,AV, TV 6.  Normal Aortic root 3.0 cm 7.  Normal T2* 48 msec 8. Unable to calculate T1/ECG failure to acquire adequate T1 map sequence pre contrast    Recent Labs: 09/13/2019: ALT 26; BUN 17; Creatinine, Ser 0.82; Hemoglobin 14.2; Platelets 276.0;  Potassium 4.5; Sodium 136; TSH 1.33  Recent Lipid Panel    Component Value Date/Time   CHOL 139 09/13/2019 0941   CHOL 163 04/30/2019 1145   TRIG 166.0 (H) 09/13/2019 0941   HDL 49.40 09/13/2019 0941   HDL 55 04/30/2019 1145   CHOLHDL 3 09/13/2019 0941   VLDL 33.2 09/13/2019 0941   LDLCALC 57 09/13/2019 0941   LDLCALC 75 04/30/2019 1145    Physical Exam:    VS:  BP 118/68   Pulse 86   Ht 5\' 10"  (1.778 m)   Wt 222 lb (100.7 kg)   SpO2 95%   BMI 31.85 kg/m     Wt Readings from Last 3 Encounters:  07/14/20 222 lb (100.7 kg)  07/14/20 216 lb (98 kg)  07/08/20 225 lb (102.1 kg)     GEN:   in no acute distress HEENT: Normal NECK: No JVD CARDIAC:RRR, no murmurs, rubs, gallops RESPIRATORY:  Clear to auscultation without rales, wheezing or rhonchi  ABDOMEN: Soft, non-tender, non-distended MUSCULOSKELETAL:  No edema; No deformity  SKIN: Warm and dry NEUROLOGIC:  Alert and oriented x 3 PSYCHIATRIC:  Normal affect   ASSESSMENT:    1. Chronic combined systolic and diastolic heart failure ([place])   2. Coronary artery disease involving native coronary artery of native heart without angina pectoris   3. Hyperlipidemia, unspecified hyperlipidemia type   4. Essential hypertension   5. OSA on CPAP    PLAN:    Chronic combined systolic and diastolic heart failure: EF 40 to 45% on TTE from 05/02/18, with marked septal-lateral dyssynchrony consistent with LBBB.  Coronary CTA showed nonobstructive CAD.  CMR showed EF 45%, no LGE.  Appears euvolemic.  Suspect systolic dysfunction due to LBBB -Currently on losartan 50 mg daily.  Will switch to Entresto 24/26 mg twice daily.  Check BMP.  Follow-up in pharmacy heart failure clinic in 2 weeks for further titration -Continue Toprol-XL 100 mg daily  Coronary artery disease: coronary CTA on 06/06/2019 showed nonobstructive CAD (calcified plaque in the RPDA causing mild (25-49%) stenosis and calcified plaque in the proximal LAD and proximal RCA  causing minimal (0-24%) stenosis.  Calcium score 111 (62nd percentile for age/gender). -Continue atorvastatin 80 mg daily -Continue aspirin 81 mg daily  Hypertension: Continue Toprol-XL, switch from losartan to [place] as above.  Appears well controlled.  Hyperlipidemia: Continue atorvastatin 80 mg daily and Zetia 10 mg daily.  LDL 57 on 09/13/2019, at goal less than 70  Type 2 diabetes: [name] 7.5 on 09/13/19.   Worsened to 10.7% on 06/18/20.  On metformin,  semaglutide  OSA: Sleep study on 05/15/2019 confirmed OSA, has started on Bipap  RTC in 3 months  Medication Adjustments/Labs and Tests Ordered: Current medicines are reviewed at length with the patient today.  Concerns regarding medicines are outlined above.  Orders Placed This Encounter  Procedures  . Basic metabolic panel  . EKG 12-Lead   Meds ordered this encounter  Medications  . sacubitril-valsartan (ENTRESTO) 24-26 MG    Sig: Take 1 tablet by mouth 2 (two) times daily.    Dispense:  60 tablet    Refill:  0    Patient Instructions  Medication Instructions:  STOP Losartan START Entresto 24/26 mg two times daily  *If you need a refill on your cardiac medications before your next appointment, please call your pharmacy*   Lab Work: BMET today  If you have labs (blood work) drawn today and your tests are completely normal, you will receive your results only by: Marland Kitchen MyChart Message (if you have MyChart) OR . A paper copy in the mail If you have any lab test that is abnormal or we need to change your treatment, we will call you to review the results.  Follow-Up: At [facility], you and your health needs are our priority.  As part of our continuing mission to provide you with exceptional heart care, we have created designated Provider Care Teams.  These Care Teams include your primary Cardiologist (physician) and Advanced Practice Providers (APPs -  Physician Assistants and Nurse Practitioners) who all work together to  provide you with the care you need, when you need it.  We recommend signing up for the patient portal called "MyChart".  Sign up information is provided on this After Visit Summary.  MyChart is used to connect with patients for Virtual Visits (Telemedicine).  Patients are able to view lab/test results, encounter notes, upcoming appointments, etc.  Non-urgent messages can be sent to your provider as well.   To learn more about what you can do with MyChart, go to [email or web address].    Your next appointment:   2 weeks with pharmacist (HF med titration) 3 months with Dr. Gardiner Rhyme       Signed, Travis Heinz, MD  07/14/2020 11:16 PM    [place]

## 2020-07-13 NOTE — Progress Notes (Signed)
Cardiology Office Note    Date:  07/13/2020   ID:  Travis, Owens 11-14-1955, MRN [id number]  PCP:  Travis Barrack, MD  Cardiologist:  Travis Majestic, MD (sleep): Dr. Gardiner Owens  New sleep evaluation.  History of Present Illness:  Travis Owens is a 65 y.o. male who is followed by Dr. Gilman Owens for primary cardiology care.  He has a history of chronic combined systolic and diastolic heart failure, type 2 diabetes mellitus, hypertension, as well as hyperlipidemia.  He has been demonstrated to have left bundle branch block.  Coronary CTA in February 2021 showed nonobstructive CAD with a calcium score of 111 (62nd percentile for age/gender.  CMR on July 25, 2019 showed an EF 45% with RV EF 37% and no late gadolinium enhancement.  Due to concerns for obstructive sleep apnea, he was referred for a sleep study on May 23, 2019.  He met split-night criteria and on the diagnostic portion of the study sleep apnea was severe with an AHI of 50.5/h, RDI 76.1/h, and he was unable to achieve any REM sleep.  Oxygen desaturated to a nadir of 76%.  He underwent CPAP titration and was transitioned to BiPAP therapy due to central events.  He was titrated up to 18/14 but he did not have any REM sleep at that stage.    Prior to CPAP therapy, he admits to snoring, daytime sleepiness, frequent nocturia, nonrestorative sleep, as well as fatigability.  He typically goes to bed at midnight and may wake up around 6 or 7 AM.  He states remotely he had a sleep study 20 years ago.  I saw him for initial sleep evaluation with me on January 02, 2020.  We were able to obtain several downloads.  It appears that his initial download  from November 12, 2019 through December 11, 2019 he was meeting usage compliance at 90 days but he just barely missed duration compliance with usage greater than 4 hours at only 67%.  His average usage was 4 hours and 45 minutes.  AHI was 12.2 and his 95th percentile pressure was 21.7/17.7.  Download  in August showed decreased compliance but he had been out of town and forgot to take his machine with him.  AHI was 8.8 and 95th percentile pressure was 21.6/17.6.  [place] he has used CPAP with 100% compliance since his return in August 29.  However the download from August 10 through January 01, 2020 was inclusive of days where he did not have machine and therefore compliance was not met.  He does admit to feeling improved since initiating CPAP therapy, however, he still has residual daytime sleepiness.  An Epworth Sleepiness Scale score was endorsed in the office today and this endorsed at 13 as shown below:  Epworth Sleepiness Scale: Situation   Chance of Dozing/Sleeping (0 = never , 1 = slight chance , 2 = moderate chance , 3 = high chance )   sitting and reading 2   watching TV 2   sitting inactive in a public place 1   being a passenger in a motor vehicle for an hour or more 2   lying down in the afternoon 3   sitting and talking to someone 0   sitting quietly after lunch (no alcohol) 3   while stopped for a few minutes in traffic as the driver 0   Total Score  13   During that evaluation  I discussed the importance of meeting compliance standards.  I made adjustment to his equipment and decreased his ramp time from 30 minutes down to 15 minutes.  I increased his minimum EPAP initiation to 14, and change his initial pressure support of 5 cm water pressure.  At the time he was consistently having 95th percentile pressures at almost 22/18.  I recommended that a download be obtained within 4 weeks.  Subsequently, Travis Owens has felt well.  He is compliant with CPAP.  A download from February 14 through July 07, 2020 shows 80% of usage days with 73% of usage greater than 4 hours.  Average use was 6 hours and 42 minutes.  He is set at minimum EPAP pressure of 15 with maximum IPAP pressure 25 and his 95th percentile pressure now is  23.3/18.2 with maximum average pressure of 24.2/19.2.  AHI is 8.1.  An Epworth Sleepiness Scale score was calculated in the office today and this endorsed at 5 arguing against recurrent daytime sleepiness.  He feels that his sleep is significantly improved with therapy than prior to treatment.  He is unaware of breakthrough snoring.  He has continued to see Dr. Gilman Owens for his primary cardiology care.  He presents for follow-up evaluation    Past Medical History:  Diagnosis Date  . Anxiety   . Chest pain   . Depression   . Diabetes mellitus without complication ([place])   . Fatty liver   . GERD (gastroesophageal reflux disease)   . Hyperlipidemia   . Hypertension   . Sleep apnea    has CPAP/does not use    Past Surgical History:  Procedure Laterality Date  . COLONOSCOPY     Dr. Olevia Owens  2008    Current Medications: Outpatient Medications Prior to Visit  Medication Sig Dispense Refill  . aspirin EC 81 MG tablet Take 81 mg by mouth daily.    Marland Kitchen atorvastatin (LIPITOR) 80 MG tablet Take 1 tablet (80 mg total) by mouth daily at 6 PM. 90 tablet 3  . Blood Glucose Monitoring Suppl (FREESTYLE LITE) DEVI Check Blood sugar twice daily 1 Device 0  . escitalopram (LEXAPRO) 20 MG tablet Take 1 tablet (20 mg total) by mouth daily. 90 tablet 3  . ezetimibe (ZETIA) 10 MG tablet Take 1 tablet (10 mg total) by mouth daily. 90 tablet 3  . glucose blood (FREESTYLE LITE) test strip Check Blood sugar twice daily 100 each 12  . Lancets (FREESTYLE) lancets Check blood sugar twice daily 100 each 12  . losartan (COZAAR) 100 MG tablet Take 0.5 tablets (50 mg total) by mouth daily. 30 tablet 0  . metFORMIN (GLUCOPHAGE) 1000 MG tablet Take 1 tablet (1,000 mg total) by mouth 2 (two) times daily. 180 tablet 3  . metoprolol succinate (TOPROL-XL) 100 MG 24 hr tablet Take 100 mg by mouth daily.    . Multiple Vitamin (MULTIVITAMIN) tablet Take 1 tablet by mouth daily.    . pantoprazole (PROTONIX) 40 MG tablet Take 1  tablet (40 mg total) by mouth daily. 90 tablet 3  . Semaglutide-Weight Management (WEGOVY) 2.4 MG/0.75ML SOAJ Inject 2.4 mg into the skin once a week.    . Vitamin D, Ergocalciferol, (DRISDOL) 1.25 MG (50000 UNIT) CAPS capsule Take 1 capsule (50,000 Units total) by mouth every 7 (seven) days. (Patient taking differently: Take 50,000 Units by mouth once a week.) 4 capsule 0  . metoprolol succinate (TOPROL-XL) 50 MG 24 hr tablet Take 50 mg by mouth daily. Take with  or immediately following a meal.    . Semaglutide-Weight Management (WEGOVY) 1.7 MG/0.75ML SOAJ Inject 1.7 mg into the skin once a week. (Patient taking differently: Inject 2.4 mg into the skin once a week.) 3 mL 0   No facility-administered medications prior to visit.     Allergies:   Patient has no known allergies.   Social History   Socioeconomic History  . Marital status: Married    Spouse name: Damione Robideau  . Number of children: Not on file  . Years of education: Not on file  . Highest education level: Not on file  Occupational History  . Occupation: Theme park manager    Comment: [employer]  Tobacco Use  . Smoking status: Never Smoker  . Smokeless tobacco: Never Used  Vaping Use  . Vaping Use: Never used  Substance and Sexual Activity  . Alcohol use: No  . Drug use: No  . Sexual activity: Yes  Other Topics Concern  . Not on file  Social History Narrative  . Not on file   Social Determinants of Health   Financial Resource Strain: Not on file  Food Insecurity: Not on file  Transportation Needs: Not on file  Physical Activity: Not on file  Stress: Not on file  Social Connections: Not on file    Socially he is married for 20 years.  He does not have children.  He works as a Theme park manager.  Family History:  The patient's family history includes Cancer in his father; Hypertension in his paternal grandmother; Stroke in his father and paternal grandfather.  Father died at age 65 and was a heavy smoker who had lung  cancer Mother died at age 52 He has 1 brother age 54 A cousin has sleep apnea.  ROS General: Negative; No fevers, chills, or night sweats;  HEENT: Negative; No changes in vision or hearing, sinus congestion, difficulty swallowing Pulmonary: Negative; No cough, wheezing, shortness of breath, hemoptysis Cardiovascular: Negative; No chest pain, presyncope, syncope, palpitations GI: Negative; No nausea, vomiting, diarrhea, or abdominal pain GU: Negative; No dysuria, hematuria, or difficulty voiding Musculoskeletal: Negative; no myalgias, joint pain, or weakness Hematologic/Oncology: Negative; no easy bruising, bleeding Endocrine: Negative; no heat/cold intolerance; no diabetes Neuro: Negative; no changes in balance, headaches Skin: Negative; No rashes or skin lesions Psychiatric: Negative; No behavioral problems, depression Sleep: Positive for OSA, severe with snoring, daytime sleepiness, nonrestorative sleep, frequent awakenings, and excessive daytime sleepiness.  No bruxism, restless legs, hypnogognic hallucinations, no cataplexy Other comprehensive 14 point system review is negative.   PHYSICAL EXAM:   VS:  BP 120/78   Pulse 83   Ht '5\' 10"'  (1.778 m)   Wt 225 lb (102.1 kg)   SpO2 96%   BMI 32.28 kg/m     Repeat blood pressure by me was 122/78  Wt Readings from Last 3 Encounters:  07/08/20 225 lb (102.1 kg)  06/23/20 218 lb (98.9 kg)  06/18/20 225 lb 12.8 oz (102.4 kg)    General: Alert, oriented, no distress.  Skin: normal turgor, no rashes, warm and dry HEENT: Normocephalic, atraumatic. Pupils equal round and reactive to light; sclera anicteric; extraocular muscles intact;  Nose without nasal septal hypertrophy Mouth/Parynx benign; Mallinpatti scale 3 Neck: No JVD, no carotid bruits; normal carotid upstroke Lungs: clear to ausculatation and percussion; no wheezing or rales Chest wall: without tenderness to palpitation Heart: PMI not displaced, RRR, s1 s2 normal, 1/6  systolic murmur, no diastolic murmur, no rubs, gallops, thrills, or heaves Abdomen: soft,  nontender; no hepatosplenomehaly, BS+; abdominal aorta nontender and not dilated by palpation. Back: no CVA tenderness Pulses 2+ Musculoskeletal: full range of motion, normal strength, no joint deformities Extremities: no clubbing cyanosis or edema, Homan's sign negative  Neurologic: grossly nonfocal; Cranial nerves grossly wnl Psychologic: Normal mood and affect  Studies/Labs Reviewed:   EKG:  EKG is not ordered today.  I personally reviewed the ECG of May 02, 2019 which showed normal sinus rhythm at 82 with left bundle branch block and repolarization changes.  Recent Labs: BMP Latest Ref Rng & Units 09/13/2019 04/30/2019 09/12/2018  Glucose 70 - 99 mg/dL 141(H) 183(H) 138(H)  BUN 6 - 23 mg/dL '17 13 13  ' Creatinine 0.40 - 1.50 mg/dL 0.82 0.62(L) 0.76  BUN/Creat Ratio 10 - 24 - 21 -  Sodium 135 - 145 mEq/L 136 138 138  Potassium 3.5 - 5.1 mEq/L 4.5 4.4 4.8  Chloride 96 - 112 mEq/L 100 101 99  CO2 19 - 32 mEq/L '31 20 29  ' Calcium 8.4 - 10.5 mg/dL 9.7 9.5 9.6     Hepatic Function Latest Ref Rng & Units 09/13/2019 04/30/2019 09/12/2018  Total Protein 6.0 - 8.3 g/dL 7.1 7.0 7.3  Albumin 3.5 - 5.2 g/dL 4.7 4.5 4.7  AST 0 - 37 U/L '23 26 25  ' ALT 0 - 53 U/L '26 30 29  ' Alk Phosphatase 39 - 117 U/L 60 73 57  Total Bilirubin 0.2 - 1.2 mg/dL 0.6 0.3 0.4    CBC Latest Ref Rng & Units 09/13/2019 04/30/2019 09/12/2018  WBC 4.0 - 10.5 K/uL 8.7 8.3 9.2  Hemoglobin 13.0 - 17.0 g/dL 14.2 14.1 14.0  Hematocrit 39.0 - 52.0 % 42.7 42.1 41.6  Platelets 150.0 - 400.0 K/uL 276.0 273 289.0   Lab Results  Component Value Date   MCV 90.2 09/13/2019   MCV 87 04/30/2019   MCV 88.8 09/12/2018   Lab Results  Component Value Date   TSH 1.33 09/13/2019   Lab Results  Component Value Date   HGBA1C 10.7 (A) 06/18/2020     BNP No results found for: BNP  ProBNP No results found for: PROBNP   Lipid Panel      Component Value Date/Time   CHOL 139 09/13/2019 0941   CHOL 163 04/30/2019 1145   TRIG 166.0 (H) 09/13/2019 0941   HDL 49.40 09/13/2019 0941   HDL 55 04/30/2019 1145   CHOLHDL 3 09/13/2019 0941   VLDL 33.2 09/13/2019 0941   LDLCALC 57 09/13/2019 0941   LDLCALC 75 04/30/2019 1145   LABVLDL 33 04/30/2019 1145     RADIOLOGY: No results found.   Additional studies/ records that were reviewed today include:   SPLIT NIGHT STUDY: 05/23/2019 SLEEP STUDY TECHNIQUE As per the AASM Manual for the Scoring of Sleep and Associated Events v2.3 (April 2016) with a hypopnea requiring 4% desaturations.  The channels recorded and monitored were frontal, central and occipital EEG, electrooculogram (EOG), submentalis EMG (chin), nasal and oral airflow, thoracic and abdominal wall motion, anterior tibialis EMG, snore microphone, electrocardiogram, and pulse oximetry. Bi-level positive airway pressure (BiPAP) was initiated when the patient met split night criteria and was titrated according to treat sleep-disordered breathing.  RESPIRATORY PARAMETERS Diagnostic Total AHI (/hr):            50.5     RDI (/hr):         76.1     OA Index (/hr):            16.1  CA Index (/hr):      0.0 REM AHI (/hr):            N/A      NREM AHI (/hr):          50.5     Supine AHI (/hr):         50.5     Non-supine AHI (/hr):        N/A Min O2 Sat (%):          76.0     Mean O2 (%):  91.9     Time below 88% (min):           15.3       Titration Optimal IPAP Pressure (cm): 18        Optimal EPAP Pressure (cm):            14        AHI at Optimal Pressure (/hr):          0.0       Min O2 at Optimal Pressure (%):       93.0 Sleep % at Optimal (%):         8          Supine % at Optimal (%):       100         SLEEP ARCHITECTURE The study was initiated at 10:41:27 PM and terminated at 4:41:50 AM. The total recorded time was 360.4 minutes. EEG confirmed total sleep time was 257.5 minutes yielding a sleep efficiency of  71.5%%. Sleep onset after lights out was 24.9 minutes with a REM latency of 202.5 minutes. The patient spent 14.6%% of the night in stage N1 sleep, 74.6%% in stage N2 sleep, 2.7%% in stage N3 and 8.2% in REM. Wake after sleep onset (WASO) was 78.0 minutes. The Arousal Index was 62.2/hour.   LEG MOVEMENT DATA The total Periodic Limb Movements of Sleep (PLMS) were 0. The PLMS index was 0.0 .  CARDIAC DATA The 2 lead EKG demonstrated sinus rhythm. The mean heart rate was 100.0 beats per minute. Other EKG findings include: None.  IMPRESSIONS - Severe obstructive sleep apnea occurred during the diagnostic portion of the study (AHI  50.5 /h; RDI 76.1/h, with absence of REM sleep). CPAP was initiated at 6 cm,  titrated to 7 cm and was then transitioned to BiPAP therapy due to central events.  BiPAP was initated at 9/5 with maximum titration 18/14 cm of water. AHI at 18/14 was 0, but RDI 86.1 without any REM sleep. - No central sleep apnea occurred during the diagnostic portion of the study (CAI = 0.0/hour). - Severe oxygen desaturation during the diagnostic portion of the study to a nadir of 76.0%. - No snoring was audible during this study. - No cardiac abnormalities were noted during this study. - Clinically significant periodic limb movements of sleep did not occur during the study.  DIAGNOSIS - Obstructive Sleep Apnea (327.23 [G47.33 ICD-10])  RECOMMENDATIONS - Recommend an initial trial of BiPAP Auto therapy with an EPAP min of 13, PS of 4 and IPAP max of 25 cm H2O with heated humidification.  A Medium size Resmed Full Face Mask AirFit F20 mask was used for the titration.  - Effort should be made to optimize nasal and oropharyngeal patency.  - Avoid alcohol, sedatives and other CNS depressants that may worsen sleep apnea and disrupt normal sleep architecture. - Sleep hygiene should be reviewed to assess factors that may  improve sleep quality. - Weight management and regular exercise should  be initiated or continued. - Recommend a download in 30 days and sleep Center for evaluation after 4 weeks of therapy.    ASSESSMENT:    1. OSA on CPAP   2. Essential hypertension   3. Chronic combined systolic and diastolic heart failure ([place])   4. Type 2 diabetes mellitus with complication, without long-term current use of insulin ([place])   5. Hyperlipidemia with target LDL less than 70     PLAN:  Mr. Marinus Eicher is a 65 year-old gentleman who is followed by Dr. Gilman Owens and has significant cardiovascular comorbidities including a history of chronic combined systolic and diastolic heart failure, hypertension, and hyperlipidemia.  He has been demonstrated to have mild nonobstructive plaque on coronary CTA and EF on CMR at 45% consistent with his echo reading.  He has been on losartan and metoprolol for hypertension, atorvastatin and Zetia for hyperlipidemia and Metformin for his diabetes mellitus.  He had significant symptoms suggesting sleep apnea including snoring, daytime sleepiness, frequent awakenings, nocturia, nonrestorative sleep, as well as fatigability.  He underwent the initial sleep evaluation which demonstrated severe sleep apnea with inability to achieve any REM sleep.  He had significant oxygen desaturation to a nadir of 76% and required BiPAP therapy.  Presently, he is meeting compliance standards.  His most recent download which I obtained from February 14 through July 07, 2020 was reviewed.  Average use is 6 hours and 42 minutes.  AHI is still elevated despite a minimum EPAP pressure of 15 and pressure support of 5.  He is requiring high pressures with his 95th percentile pressure 23/18 and maximum pressure 24.2/19.2.  He is tolerating his high pressures.  Presently I will increase his minimum EPAP pressure up to 17 such that his start pressure will be 22/17.  I am changing his ramp start pressure from 4 cm up to 8 cm with a ramp time of 15 minutes.  He typically goes to bed at  midnight and wakes up at 8 AM.  I discussed optimal sleep duration at 8 hours.  He is feeling better since initiating therapy.  He is now dreaming suggestive of attainment of REM sleep which was not achieved during the split-night protocol.  There is no significant mask leak.  His blood pressure today is stable on current therapy.  He continues to be on losartan 50 mg metoprolol succinate 100 mg daily.  He is on Zetia and atorvastatin 80 mg.  Hyperlipidemia with LDL cholesterol at 57 in May 2021.  He is diabetic on Metformin.  I will see him in 1 year for reevaluation.   Medication Adjustments/Labs and Tests Ordered: Current medicines are reviewed at length with the patient today.  Concerns regarding medicines are outlined above.  Medication changes, Labs and Tests ordered today are listed in the Patient Instructions below. Patient Instructions  Medication Instructions:  Continue current medications  *If you need a refill on your cardiac medications before your next appointment, please call your pharmacy*   Lab Work: None Ordered   Testing/Procedures: None Ordered   Follow-Up: At [facility], you and your health needs are our priority.  As part of our continuing mission to provide you with exceptional heart care, we have created designated Provider Care Teams.  These Care Teams include your primary Cardiologist (physician) and Advanced Practice Providers (APPs -  Physician Assistants and Nurse Practitioners) who all work together to provide you with the care you  need, when you need it.  We recommend signing up for the patient portal called "MyChart".  Sign up information is provided on this After Visit Summary.  MyChart is used to connect with patients for Virtual Visits (Telemedicine).  Patients are able to view lab/test results, encounter notes, upcoming appointments, etc.  Non-urgent messages can be sent to your provider as well.   To learn more about what you can do with MyChart, go to  [email or web address].    Your next appointment:   1 year(s)  The format for your next appointment:   In Person  Provider:   You may see Travis Majestic, MD or one of the following Advanced Practice Providers on your designated Care Team:    Almyra Deforest, PA-C  Fabian Sharp, PA-C or   Roby Lofts, [place]        Signed, Travis Majestic, MD  07/13/2020 5:06 PM    [place] [address], [place], [place], [place]  [address] Phone: [phone]

## 2020-07-14 ENCOUNTER — Ambulatory Visit (INDEPENDENT_AMBULATORY_CARE_PROVIDER_SITE_OTHER): Payer: 59 | Admitting: Family Medicine

## 2020-07-14 ENCOUNTER — Ambulatory Visit: Payer: 59 | Admitting: Cardiology

## 2020-07-14 ENCOUNTER — Encounter: Payer: Self-pay | Admitting: Cardiology

## 2020-07-14 ENCOUNTER — Encounter (INDEPENDENT_AMBULATORY_CARE_PROVIDER_SITE_OTHER): Payer: Self-pay | Admitting: Family Medicine

## 2020-07-14 ENCOUNTER — Other Ambulatory Visit (HOSPITAL_COMMUNITY): Payer: Self-pay | Admitting: Internal Medicine

## 2020-07-14 ENCOUNTER — Other Ambulatory Visit: Payer: Self-pay

## 2020-07-14 VITALS — BP 115/74 | HR 85 | Temp 98.0°F | Ht 70.0 in | Wt 216.0 lb

## 2020-07-14 VITALS — BP 118/68 | HR 86 | Ht 70.0 in | Wt 222.0 lb

## 2020-07-14 DIAGNOSIS — E669 Obesity, unspecified: Secondary | ICD-10-CM | POA: Diagnosis not present

## 2020-07-14 DIAGNOSIS — G4733 Obstructive sleep apnea (adult) (pediatric): Secondary | ICD-10-CM | POA: Diagnosis not present

## 2020-07-14 DIAGNOSIS — Z9989 Dependence on other enabling machines and devices: Secondary | ICD-10-CM | POA: Diagnosis not present

## 2020-07-14 DIAGNOSIS — I152 Hypertension secondary to endocrine disorders: Secondary | ICD-10-CM

## 2020-07-14 DIAGNOSIS — I10 Essential (primary) hypertension: Secondary | ICD-10-CM | POA: Diagnosis not present

## 2020-07-14 DIAGNOSIS — E785 Hyperlipidemia, unspecified: Secondary | ICD-10-CM

## 2020-07-14 DIAGNOSIS — I2510 Atherosclerotic heart disease of native coronary artery without angina pectoris: Secondary | ICD-10-CM

## 2020-07-14 DIAGNOSIS — E1165 Type 2 diabetes mellitus with hyperglycemia: Secondary | ICD-10-CM | POA: Diagnosis not present

## 2020-07-14 DIAGNOSIS — I5042 Chronic combined systolic (congestive) and diastolic (congestive) heart failure: Secondary | ICD-10-CM

## 2020-07-14 DIAGNOSIS — E1159 Type 2 diabetes mellitus with other circulatory complications: Secondary | ICD-10-CM

## 2020-07-14 DIAGNOSIS — Z6831 Body mass index (BMI) 31.0-31.9, adult: Secondary | ICD-10-CM

## 2020-07-14 DIAGNOSIS — Z9189 Other specified personal risk factors, not elsewhere classified: Secondary | ICD-10-CM

## 2020-07-14 MED ORDER — ENTRESTO 24-26 MG PO TABS: 1.0000 | ORAL_TABLET | Freq: Two times a day (BID) | ORAL | 0 refills | Status: DC

## 2020-07-14 MED ORDER — WEGOVY 1.7 MG/0.75ML ~~LOC~~ SOAJ: 1.7000 mg | SUBCUTANEOUS | 0 refills | Status: DC

## 2020-07-14 MED FILL — SHINGRIX 50 MCG SUS: 50 | 1 days supply | Qty: 1 | Fill #0

## 2020-07-14 NOTE — Patient Instructions (Signed)
Medication Instructions:  STOP Losartan START Entresto 24/26 mg two times daily  *If you need a refill on your cardiac medications before your next appointment, please call your pharmacy*   Lab Work: BMET today  If you have labs (blood work) drawn today and your tests are completely normal, you will receive your results only by: Marland Kitchen MyChart Message (if you have MyChart) OR . A paper copy in the mail If you have any lab test that is abnormal or we need to change your treatment, we will call you to review the results.  Follow-Up: At [facility], you and your health needs are our priority.  As part of our continuing mission to provide you with exceptional heart care, we have created designated Provider Care Teams.  These Care Teams include your primary Cardiologist (physician) and Advanced Practice Providers (APPs -  Physician Assistants and Nurse Practitioners) who all work together to provide you with the care you need, when you need it.  We recommend signing up for the patient portal called "MyChart".  Sign up information is provided on this After Visit Summary.  MyChart is used to connect with patients for Virtual Visits (Telemedicine).  Patients are able to view lab/test results, encounter notes, upcoming appointments, etc.  Non-urgent messages can be sent to your provider as well.   To learn more about what you can do with MyChart, go to [email or web address].    Your next appointment:   2 weeks with pharmacist (HF med titration) 3 months with Dr. Gardiner Rhyme

## 2020-07-15 ENCOUNTER — Encounter (INDEPENDENT_AMBULATORY_CARE_PROVIDER_SITE_OTHER): Payer: Self-pay

## 2020-07-15 LAB — BASIC METABOLIC PANEL
BUN/Creatinine Ratio: 29 — ABNORMAL HIGH (ref 10–24)
BUN: 23 mg/dL (ref 8–27)
CO2: 24 mmol/L (ref 20–29)
Calcium: 10.5 mg/dL — ABNORMAL HIGH (ref 8.6–10.2)
Chloride: 97 mmol/L (ref 96–106)
Creatinine, Ser: 0.8 mg/dL (ref 0.76–1.27)
Glucose: 208 mg/dL — ABNORMAL HIGH (ref 65–99)
Potassium: 4.8 mmol/L (ref 3.5–5.2)
Sodium: 138 mmol/L (ref 134–144)
eGFR: 98 mL/min/{1.73_m2} (ref >59)

## 2020-07-15 NOTE — Progress Notes (Signed)
Chief Complaint:   OBESITY Travis Owens is here to discuss his progress with his obesity treatment plan along with follow-up of his obesity related diagnoses. Travis Owens is on the Category 4 Plan and states he is following his eating plan approximately 60% of the time. Travis Owens states he is walking 20 minutes 2 times per week.  Today's visit was #: 35 Starting weight: 220 lbs Starting date: 04/30/2019 Today's weight: 216 lbs Today's date: 07/14/2020 Total lbs lost to date: 4 lbs Total lbs lost since last in-office visit: 2 lbs  Interim History: Travis Owens was last seen by Arrie Aran, who sent Wegovy 1.7 mg, but insurance did not approve it, so he was without medication. Pt did start taking 2.4 mg Wegovy as this was pt's wife's medication. The last few weeks, Travis Owens has been about 60% on the plan. He has done chocolate shake (30 g protein) but is often still hungry. One meal, he did eat a few slices of roast that his wife cooked.  Subjective:   1. Type 2 diabetes mellitus with hyperglycemia, without long-term current use of insulin ([name]) Travis Owens is eating out at dinner or incorporating more carbs, such as peanut butter sandwich with pear and maybe a few string cheese. His fasting blood sugars range 200-260.  2. Hypertension associated with diabetes ([place]) Travis Owens's BP is well controlled.  He denies chest pain, chest pressure and headache. He is taking Cozaar and Toprol.  BP Readings from Last 3 Encounters:  07/14/20 118/68  07/14/20 115/74  07/08/20 120/78    3. At risk for hyperglycemia Travis Owens is at increased risk for hyperglycemia due to changes in diet, diagnosis of diabetes, and/or insulin use.  Assessment/Plan:   1. Type 2 diabetes mellitus with hyperglycemia, without long-term current use of insulin ([name]) Good blood sugar control is important to decrease the likelihood of diabetic complications such as nephropathy, neuropathy, limb loss, blindness, coronary artery disease, and death. Intensive lifestyle  modification including diet, exercise and weight loss are the first line of treatment for diabetes. Continue GLP-1 and Metformin. Continue checking FBS daily.  2. Hypertension associated with diabetes ([place]) Travis Owens is working on healthy weight loss and exercise to improve blood pressure control. We will watch for signs of hypotension as he continues his lifestyle modifications. Continue current treatment plan.  3. At risk for hyperglycemia Travis Owens was given approximately 15 minutes of counseling today regarding prevention of hyperglycemia. He was advised of hyperglycemia causes and the fact hyperglycemia is often asymptomatic. Travis Owens was instructed to avoid skipping meals, eat regular protein rich meals and schedule low calorie but protein rich snacks as needed.   Repetitive spaced learning was employed today to elicit superior memory formation and behavioral change  4. Class 1 obesity with serious comorbidity and body mass index (BMI) of 31.0 to 31.9 in adult, unspecified obesity type Travis Owens is currently in the action stage of change. As such, his goal is to continue with weight loss efforts. He has agreed to the Category 4 Plan.   We discussed various medication options to help Travis Owens with his weight loss efforts and we both agreed to continue Wegovy 1.7 mg, as per below. - Semaglutide-Weight Management (WEGOVY) 1.7 MG/0.75ML SOAJ; Inject 1.7 mg into the skin once a week.  Dispense: 3 mL; Refill: 0  Exercise goals: All adults should avoid inactivity. Some physical activity is better than none, and adults who participate in any amount of physical activity gain some health benefits.  Behavioral modification strategies: increasing lean protein  intake, meal planning and cooking strategies, keeping healthy foods in the home and planning for success.  Travis Owens has agreed to follow-up with our clinic in 2-3 weeks. He was informed of the importance of frequent follow-up visits to maximize his success with intensive  lifestyle modifications for his multiple health conditions.   Objective:   Blood pressure 115/74, pulse 85, temperature 98 F (36.7 C), temperature source Oral, height 5\' 10"  (1.778 m), weight 216 lb (98 kg), SpO2 96 %. Body mass index is 30.99 kg/m.  General: Cooperative, alert, well developed, in no acute distress. HEENT: Conjunctivae and lids unremarkable. Cardiovascular: Regular rhythm.  Lungs: Normal work of breathing. Neurologic: No focal deficits.   Lab Results  Component Value Date   CREATININE 0.80 07/14/2020   BUN 23 07/14/2020   NA 138 07/14/2020   K 4.8 07/14/2020   CL 97 07/14/2020   CO2 24 07/14/2020   Lab Results  Component Value Date   ALT 26 09/13/2019   AST 23 09/13/2019   ALKPHOS 60 09/13/2019   BILITOT 0.6 09/13/2019   Lab Results  Component Value Date   HGBA1C 10.7 (A) 06/18/2020   HGBA1C 10.6 (A) 03/18/2020   HGBA1C 7.5 (A) 09/13/2019   HGBA1C 9.1 (A) 06/21/2019   HGBA1C 9.5 (H) 04/30/2019   Lab Results  Component Value Date   INSULIN 24.2 04/30/2019   Lab Results  Component Value Date   TSH 1.33 09/13/2019   Lab Results  Component Value Date   CHOL 139 09/13/2019   HDL 49.40 09/13/2019   LDLCALC 57 09/13/2019   TRIG 166.0 (H) 09/13/2019   CHOLHDL 3 09/13/2019   Lab Results  Component Value Date   WBC 8.7 09/13/2019   HGB 14.2 09/13/2019   HCT 42.7 09/13/2019   MCV 90.2 09/13/2019   PLT 276.0 09/13/2019    Attestation Statements:   Reviewed by clinician on day of visit: allergies, medications, problem list, medical history, surgical history, family history, social history, and previous encounter notes.  Coral Ceo, am acting as transcriptionist for Coralie Common, MD.   I have reviewed the above documentation for accuracy and completeness, and I agree with the above. - Jinny Blossom, MD

## 2020-07-22 ENCOUNTER — Other Ambulatory Visit: Payer: Self-pay | Admitting: Cardiology

## 2020-07-22 MED FILL — EZETIMIBE 10 MG TABS: 10 | 90 days supply | Qty: 90 | Fill #3

## 2020-07-22 MED FILL — PANTOPRAZOLE SOD DR 40 MG T: 40 | 30 days supply | Qty: 30 | Fill #3

## 2020-07-22 MED FILL — ATORVASTATIN 80 MG TABLET: 80 | 90 days supply | Qty: 90 | Fill #0

## 2020-07-22 MED FILL — METFORMIN HCL 1000 MG TABS: 1000 | 90 days supply | Qty: 180 | Fill #3

## 2020-07-22 MED FILL — ESCITALOPRAM 20 MG TABLET: 20 | 90 days supply | Qty: 90 | Fill #3

## 2020-07-22 NOTE — Telephone Encounter (Signed)
This is Dr. Schumann's pt 

## 2020-07-25 ENCOUNTER — Other Ambulatory Visit (HOSPITAL_COMMUNITY): Payer: Self-pay

## 2020-07-28 ENCOUNTER — Encounter (INDEPENDENT_AMBULATORY_CARE_PROVIDER_SITE_OTHER): Payer: Self-pay | Admitting: Family Medicine

## 2020-07-28 ENCOUNTER — Ambulatory Visit (INDEPENDENT_AMBULATORY_CARE_PROVIDER_SITE_OTHER): Payer: 59 | Admitting: Family Medicine

## 2020-07-28 ENCOUNTER — Other Ambulatory Visit (HOSPITAL_COMMUNITY): Payer: Self-pay

## 2020-07-28 ENCOUNTER — Other Ambulatory Visit: Payer: Self-pay

## 2020-07-28 VITALS — BP 106/65 | HR 81 | Temp 98.0°F | Ht 70.0 in | Wt 216.0 lb

## 2020-07-28 DIAGNOSIS — E1169 Type 2 diabetes mellitus with other specified complication: Secondary | ICD-10-CM

## 2020-07-28 DIAGNOSIS — E785 Hyperlipidemia, unspecified: Secondary | ICD-10-CM | POA: Diagnosis not present

## 2020-07-28 DIAGNOSIS — E669 Obesity, unspecified: Secondary | ICD-10-CM | POA: Diagnosis not present

## 2020-07-28 DIAGNOSIS — Z6833 Body mass index (BMI) 33.0-33.9, adult: Secondary | ICD-10-CM | POA: Diagnosis not present

## 2020-07-28 DIAGNOSIS — E1165 Type 2 diabetes mellitus with hyperglycemia: Secondary | ICD-10-CM | POA: Diagnosis not present

## 2020-07-28 MED ORDER — WEGOVY 2.4 MG/0.75ML ~~LOC~~ SOAJ
2.4000 mg | SUBCUTANEOUS | 0 refills | Status: DC
  Filled 2020-07-28: qty 3, fill #0
  Filled 2020-07-29: qty 4, 28d supply, fill #0
  Filled 2020-08-11: qty 3, 28d supply, fill #0

## 2020-07-29 ENCOUNTER — Other Ambulatory Visit (HOSPITAL_COMMUNITY): Payer: Self-pay

## 2020-07-30 ENCOUNTER — Other Ambulatory Visit: Payer: Self-pay

## 2020-07-30 ENCOUNTER — Ambulatory Visit (INDEPENDENT_AMBULATORY_CARE_PROVIDER_SITE_OTHER): Payer: 59 | Admitting: Pharmacist

## 2020-07-30 ENCOUNTER — Other Ambulatory Visit (HOSPITAL_COMMUNITY): Payer: Self-pay

## 2020-07-30 VITALS — BP 126/80 | HR 88 | Wt 219.0 lb

## 2020-07-30 DIAGNOSIS — I5042 Chronic combined systolic (congestive) and diastolic (congestive) heart failure: Secondary | ICD-10-CM

## 2020-07-30 LAB — BASIC METABOLIC PANEL
BUN/Creatinine Ratio: 18 (ref 10–24)
BUN: 15 mg/dL (ref 8–27)
CO2: 23 mmol/L (ref 20–29)
Calcium: 9.8 mg/dL (ref 8.6–10.2)
Chloride: 98 mmol/L (ref 96–106)
Creatinine, Ser: 0.84 mg/dL (ref 0.76–1.27)
Glucose: 216 mg/dL — ABNORMAL HIGH (ref 65–99)
Potassium: 4.8 mmol/L (ref 3.5–5.2)
Sodium: 138 mmol/L (ref 134–144)
eGFR: 97 mL/min/{1.73_m2} (ref >59)

## 2020-07-30 MED ORDER — ENTRESTO 24-26 MG PO TABS
1.0000 | ORAL_TABLET | Freq: Two times a day (BID) | ORAL | 0 refills | Status: DC
  Filled 2020-07-30: qty 60, 30d supply, fill #0

## 2020-07-30 NOTE — Patient Instructions (Addendum)
It was nice to meet you today!  Increase your metoprolol to 1.5 tablets (150mg ) once daily for the next week. Record your blood pressure and heart rate at home. I'll give you a call in a week and we'll plan to increase your dose to 200mg  daily if you're tolerating the 150mg  well  We may increase the dose of your Entresto in the future if your blood pressure is stable on the higher dose of metoprolol  Medications like spironolactone and Jardiance also have great cardiovascular benefit. We would like to start you on these medications in the future if your blood pressure allows  I'll schedule follow up with you when I call you in a week  Free 30 day Entresto card: BIN: [id number] PCN: [id number] GRP: [id number] ID: [id number]  $10/month copay card Delene LollKara Dies: [id number] PCN: [id number] GRP: [id number] ID: [id number]

## 2020-07-30 NOTE — Progress Notes (Signed)
Patient ID: Travis Owens                 DOB: Mar 15, 1956                      MRN: [id number]     HPI: Travis Owens is a 65 y.o. male referred by Dr. Gardiner Rhyme to pharmacy clinic for HF medication management. PMH is significant for chronic combined systolic and diastolic HF, H8IO, HTN, HLD, OSA, and LBBB. TTE 05/03/19 showed EF 40-45% with marked septal-lateral dyssynchrony consistent with LBBB. Coronary CTA 06/06/19 showed nonobstructive CAD (calcified plaque in the RPDA causing mild (25-49%) stenosis and calcified plaque in the proximal LAD and proximal RCA causing minimal (0-24%) stenosis).  Calcium score 111 (62nd percentile for age/gender).  CMR on 07/25/2019 showed EF 45%, no LGE, RV EF 37%. At last visit with Dr Gardiner Rhyme on 07/14/20, pt was changed from losartan to [facility] 24-26mg  BID and referred to HF clinic for further med management.  Pt presents today in good spirits. He reports tolerating Entresto well. Previously took losartan 100mg  but dose was decreased to 50mg  daily secondary to dizziness when he would work outside. Has a home BP cuff but hasn't been monitoring; his wife is an Therapist, sports at [employer]. Denies dizziness, swelling, SOB. Pt was changed from [place] to [facility] by [facility] to help with further weight loss. He saw them 2 days ago for follow up visit and BP was 106/75. Home glucose has been in the 200-260s fasting, [name] increased to 10.7% last month. Pt reports prior reduction in [name] of 2% when he was able to lose about 15 lbs.  Current CHF meds: Toprol 100mg  daily, Entresto 24-26mg  BID Previously tried: losartan - changed to Entresto BP goal: <130/43mmHg  Family History: Cancer in his father; Hypertension in his paternal grandmother; Stroke in his father and paternal grandfather. There is no history of Colon cancer, Colon polyps, Esophageal cancer, Rectal cancer, or Stomach cancer.  Social History: Denies tobacco, alcohol, and drug use.  Diet: [facility] - has him on a diet  Exercise: walking more - walks a 20 minute mile. Goal is to walk 30 minutes 6 days a week  Home BP readings: hasn't been checking  Wt Readings from Last 3 Encounters:  07/28/20 216 lb (98 kg)  07/14/20 222 lb (100.7 kg)  07/14/20 216 lb (98 kg)   BP Readings from Last 3 Encounters:  07/28/20 106/65  07/14/20 118/68  07/14/20 115/74   Pulse Readings from Last 3 Encounters:  07/28/20 81  07/14/20 86  07/14/20 85    Renal function: Estimated Creatinine Clearance: 108.1 mL/min (by C-G formula based on SCr of 0.8 mg/dL).  Past Medical History:  Diagnosis Date  . Anxiety   . Chest pain   . Depression   . Diabetes mellitus without complication ([place])   . Fatty liver   . GERD (gastroesophageal reflux disease)   . Hyperlipidemia   . Hypertension   . Sleep apnea    has CPAP/does not use    Current Outpatient Medications on File Prior to Visit  Medication Sig Dispense Refill  . aspirin EC 81 MG tablet Take 81 mg by mouth daily.    Marland Kitchen atorvastatin (LIPITOR) 80 MG tablet TAKE 1 TABLET BY MOUTH ONCE DAILY AT 6 PM 90 tablet 3  . Blood Glucose Monitoring Suppl (FREESTYLE LITE) DEVI Check Blood sugar twice daily 1 Device 0  .  escitalopram (LEXAPRO) 20 MG tablet TAKE 1 TABLET (20 MG TOTAL) BY MOUTH DAILY. 90 tablet 3  . ezetimibe (ZETIA) 10 MG tablet TAKE 1 TABLET (10 MG TOTAL) BY MOUTH DAILY. 90 tablet 3  . glucose blood (FREESTYLE LITE) test strip Check Blood sugar twice daily 100 each 12  . Lancets (FREESTYLE) lancets Check blood sugar twice daily 100 each 12  . metFORMIN (GLUCOPHAGE) 1000 MG tablet TAKE 1 TABLET (1,000 MG TOTAL) BY MOUTH 2 (TWO) TIMES DAILY. 180 tablet 3  . metoprolol succinate (TOPROL-XL) 100 MG 24 hr tablet Take 100 mg by mouth daily.    . Multiple Vitamin (MULTIVITAMIN) tablet Take 1 tablet by mouth daily.    . pantoprazole (PROTONIX) 40 MG tablet TAKE 1 TABLET (40 MG TOTAL) BY MOUTH DAILY. 90 tablet 3  .  sacubitril-valsartan (ENTRESTO) 24-26 MG Take 1 tablet by mouth 2 (two) times daily. 60 tablet 0  . Semaglutide-Weight Management (WEGOVY) 2.4 MG/0.75ML SOAJ Inject 2.4 mg into the skin once a week. 3 mL 0  . Vitamin D, Ergocalciferol, (DRISDOL) 1.25 MG (50000 UNIT) CAPS capsule TAKE 1 CAPSULE (50,000 UNITS TOTAL) BY MOUTH EVERY 7 (SEVEN) DAYS. (Patient taking differently: Take 50,000 Units by mouth once a week.) 4 capsule 0  . Zoster Vaccine Adjuvanted [facility]) injection TO BE ADMINISTERED BY PHARMACIST 1 each 1  . [DISCONTINUED] losartan (COZAAR) 100 MG tablet Take 0.5 tablets (50 mg total) by mouth daily. 30 tablet 0   No current facility-administered medications on file prior to visit.    No Known Allergies   Assessment/Plan:  1. CHF - BP at goal <130/62mmHg with room to further optimize CHF meds. Pt has previously experienced hypotension on higher doses of losartan and his BP may limit max CHF med titration. Will check BMET today since starting Entresto. Will increase Toprol to 150mg  daily. I'll call pt in a week and if tolerating well, will further increase Toprol to 200mg  daily. Pt encouraged to start checking BP at home over the next week - this will help provide guidance on whether Entresto dose can be titrated. Discussed benefit of aldosterone antagonists and SGLT2i with pt as well. Will pend potential spironolactone start until after Toprol and Entresto doses are optimized. Pt previously took Roe for his DM but stopped secondary to concerns over mycotic infections, he had not experienced any though. His DM is not well controlled currently as his [name] is 10.7%. Discussed that restarting Jardiance would benefit his CHF, DM, and provide CV risk reduction as well. He will think about restarting therapy. I have also activate Entresto copay cards for pt and provided him with info for free first month and follow up $10/month copay cards.  Kentrell Hallahan E. Marieke Lubke, PharmD, BCACP, [place] [address] [place], [place] [address] Phone: [phone]; Fax: [phone] 07/30/2020 10:57 AM

## 2020-08-02 NOTE — Progress Notes (Signed)
Chief Complaint:   OBESITY Travis Owens is here to discuss his progress with his obesity treatment plan along with follow-up of his obesity related diagnoses. Travis Owens is on the Category 4 Plan and states he is following his eating plan approximately 75% of the time. Travis Owens states he is walking 20 minutes 3 times per week.  Today's visit was #: 19 Starting weight: 220 lbs Starting date: 04/30/2019 Today's weight: 216 lbs Today's date: 07/28/2020 Total lbs lost to date: 4 Total lbs lost since last in-office visit: 0  Interim History: Travis Owens has started walking some and did have some indulgent eating over the last few weeks. He had banana, chocolate, rice. 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 He is doing 4 oz of protein at lunch and getting close to protein at dinner (he occasionally eating protein in form of [employer] burgers).  Subjective:   1. Hyperlipidemia associated with type 2 diabetes mellitus (Travis Owens) Travis Owens is on Lipitor and Zetia. He denies myalgias or transaminitis.  2. Type 2 diabetes mellitus with hyperglycemia, without long-term current use of insulin (Travis Owens) Travis Owens's last [name] 10.7 and insulin level 24.2. He is on Metformin and GLP-1.  Assessment/Plan:   1. Hyperlipidemia associated with type 2 diabetes mellitus (Travis Owens) Cardiovascular risk and specific lipid/LDL goals reviewed.  We discussed several lifestyle modifications today and Travis Owens will continue to work on diet, exercise and weight loss efforts. Orders and follow up as documented in patient record. Repeat labs in 3 months.  Counseling Intensive lifestyle modifications are the first line treatment for this issue. . Dietary changes: Increase soluble fiber. Decrease simple carbohydrates. . Exercise changes: Moderate to vigorous-intensity aerobic activity 150 minutes per week if tolerated. . Lipid-lowering medications: see documented in medical record.  2. Type 2 diabetes mellitus with hyperglycemia, without  long-term current use of insulin (Travis Owens) Good blood sugar control is important to decrease the likelihood of diabetic complications such as nephropathy, neuropathy, limb loss, blindness, coronary artery disease, and death. Intensive lifestyle modification including diet, exercise and weight loss are the first line of treatment for diabetes. Repeat labs in June/July.  3. Class 1 obesity with serious comorbidity and body mass index (BMI) of 33.0 to 33.9 in adult, unspecified obesity type Travis Owens is currently in the action stage of change. As such, his goal is to continue with weight loss efforts. He has agreed to the Category 4 Plan.   We discussed various medication options to help Travis Owens with his weight loss efforts and we both agreed to increase Wegovy to 2.4 mg, as prescribed below. - Semaglutide-Weight Management (WEGOVY) 2.4 MG/0.75ML SOAJ; Inject 2.4 mg into the skin once a week.  Dispense: 3 mL; Refill: 0  Exercise goals: All adults should avoid inactivity. Some physical activity is better than none, and adults who participate in any amount of physical activity gain some health benefits.  Behavioral modification strategies: increasing lean protein intake, meal planning and cooking strategies, keeping healthy foods in the home and planning for success.  Travis Owens has agreed to follow-up with our clinic in 3 weeks. He was informed of the importance of frequent follow-up visits to maximize his success with intensive lifestyle modifications for his multiple health conditions.   Objective:   Blood pressure 106/65, pulse 81, temperature 98 F (36.7 C), height 5\' 10"  (1.778 m), weight 216 lb (98 kg), SpO2 96 %. Body mass index is 30.99 kg/m.  General: Cooperative, alert, well developed, in no acute distress. HEENT:  Conjunctivae and lids unremarkable. Cardiovascular: Regular rhythm.  Lungs: Normal work of breathing. Neurologic: No focal deficits.   Lab Results  Component Value Date   CREATININE 0.84  07/30/2020   BUN 15 07/30/2020   NA 138 07/30/2020   K 4.8 07/30/2020   CL 98 07/30/2020   CO2 23 07/30/2020   Lab Results  Component Value Date   ALT 26 09/13/2019   AST 23 09/13/2019   ALKPHOS 60 09/13/2019   BILITOT 0.6 09/13/2019   Lab Results  Component Value Date   HGBA1C 10.7 (A) 06/18/2020   HGBA1C 10.6 (A) 03/18/2020   HGBA1C 7.5 (A) 09/13/2019   HGBA1C 9.1 (A) 06/21/2019   HGBA1C 9.5 (H) 04/30/2019   Lab Results  Component Value Date   INSULIN 24.2 04/30/2019   Lab Results  Component Value Date   TSH 1.33 09/13/2019   Lab Results  Component Value Date   CHOL 139 09/13/2019   HDL 49.40 09/13/2019   LDLCALC 57 09/13/2019   TRIG 166.0 (H) 09/13/2019   CHOLHDL 3 09/13/2019   Lab Results  Component Value Date   WBC 8.7 09/13/2019   HGB 14.2 09/13/2019   HCT 42.7 09/13/2019   MCV 90.2 09/13/2019   PLT 276.0 09/13/2019    Attestation Statements:   Reviewed by clinician on day of visit: allergies, medications, problem list, medical history, surgical history, family history, social history, and previous encounter notes.  Time spent on visit including pre-visit chart review and post-visit care and charting was 15 minutes.   Coral Ceo, am acting as transcriptionist for Coralie Common, MD.   I have reviewed the above documentation for accuracy and completeness, and I agree with the above. - Jinny Blossom, MD

## 2020-08-06 ENCOUNTER — Telehealth: Payer: Self-pay | Admitting: Pharmacist

## 2020-08-06 NOTE — Telephone Encounter (Signed)
Called pt to follow up with Toprol 150mg  daily. Pt reports tolerating well. Denies dizziness and balance problems. Checked vitals once, BP 103/65, HR 85. Will try increasing Toprol to 200mg  daily to target lower HR. He still has 30-40 pills left and will plan to take 2 of his 100mg  tablets. Will call pt in 1-2 weeks to ensure he's tolerating higher Toprol dose well, and will send in refill for 200mg  tablet at that time. He will call before then if he has any tolerability issues.

## 2020-08-11 ENCOUNTER — Other Ambulatory Visit (HOSPITAL_COMMUNITY): Payer: Self-pay

## 2020-08-11 DIAGNOSIS — G4733 Obstructive sleep apnea (adult) (pediatric): Secondary | ICD-10-CM | POA: Diagnosis not present

## 2020-08-13 ENCOUNTER — Other Ambulatory Visit (HOSPITAL_COMMUNITY): Payer: Self-pay

## 2020-08-13 MED ORDER — METFORMIN HCL ER 500 MG PO TB24
2000.0000 mg | ORAL_TABLET | Freq: Every day | ORAL | 0 refills | Status: DC
  Filled 2020-08-13: qty 360, 90d supply, fill #0

## 2020-08-17 ENCOUNTER — Other Ambulatory Visit (HOSPITAL_COMMUNITY): Payer: Self-pay

## 2020-08-17 ENCOUNTER — Telehealth: Payer: Self-pay | Admitting: Pharmacist

## 2020-08-17 MED ORDER — METOPROLOL SUCCINATE ER 200 MG PO TB24
200.0000 mg | ORAL_TABLET | Freq: Every day | ORAL | 3 refills | Status: DC
  Filled 2020-08-17 – 2020-09-07 (×2): qty 90, 90d supply, fill #0
  Filled 2020-12-08: qty 90, 90d supply, fill #1
  Filled 2021-03-08: qty 90, 90d supply, fill #2
  Filled 2021-06-29: qty 90, 90d supply, fill #3

## 2020-08-17 MED ORDER — ENTRESTO 49-51 MG PO TABS
1.0000 | ORAL_TABLET | Freq: Two times a day (BID) | ORAL | 5 refills | Status: DC
Start: 2020-08-17 — End: 2020-08-27
  Filled 2020-08-17: qty 60, 30d supply, fill #0

## 2020-08-17 NOTE — Telephone Encounter (Signed)
Called pt to follow up with Toprol 200mg  daily tolerability. Will plan to add on low dose spironolactone or SGLT2i if BP allows. Pt will also need refill for higher dose of Toprol if he's doing well.  Left message for pt to discuss.

## 2020-08-17 NOTE — Telephone Encounter (Signed)
Pt returned call to clinic. Feels well on higher dose of Toprol 200mg  daily. BP 107/78, 113/70, 111/64; HR 73, 83. Has been working in the yard bending over, no dizziness. Reports he has lost about 5 lbs in the past week and still wants to lose 40-50 more. Discussed increasing Entresto/starting spironolactone/starting SGLT2i. Pt still hesitant to resume SGLT2i (would benefit his DM as well) due to concerns over mycotic infections. He is agreeable to increase his dose of Entresto to 49-51mg  BID and has copay card info. He's aware to keep a close eye on his BP to make sure we don't drop him too low. Will recheck BP and BP in office in 7-10 days.

## 2020-08-19 ENCOUNTER — Other Ambulatory Visit: Payer: Self-pay

## 2020-08-19 ENCOUNTER — Encounter (INDEPENDENT_AMBULATORY_CARE_PROVIDER_SITE_OTHER): Payer: Self-pay | Admitting: Family Medicine

## 2020-08-19 ENCOUNTER — Ambulatory Visit (INDEPENDENT_AMBULATORY_CARE_PROVIDER_SITE_OTHER): Payer: 59 | Admitting: Family Medicine

## 2020-08-19 VITALS — BP 100/64 | HR 85 | Temp 97.7°F | Ht 70.0 in | Wt 212.0 lb

## 2020-08-19 DIAGNOSIS — I152 Hypertension secondary to endocrine disorders: Secondary | ICD-10-CM | POA: Diagnosis not present

## 2020-08-19 DIAGNOSIS — E1159 Type 2 diabetes mellitus with other circulatory complications: Secondary | ICD-10-CM | POA: Diagnosis not present

## 2020-08-19 DIAGNOSIS — E669 Obesity, unspecified: Secondary | ICD-10-CM | POA: Diagnosis not present

## 2020-08-19 DIAGNOSIS — Z6833 Body mass index (BMI) 33.0-33.9, adult: Secondary | ICD-10-CM

## 2020-08-19 DIAGNOSIS — E1165 Type 2 diabetes mellitus with hyperglycemia: Secondary | ICD-10-CM

## 2020-08-20 NOTE — Progress Notes (Signed)
Chief Complaint:   OBESITY Travis Owens is here to discuss his progress with his obesity treatment plan along with follow-up of his obesity related diagnoses. Travis Owens is on the Category 4 Plan and states he is following his eating plan approximately 50% of the time. Travis Owens states he is working in the yard 4-5 hours 3-4 times per week.  Today's visit was #: 20 Starting weight: 220 lbs Starting date: 04/30/2019 Today's weight: 212 lbs Today's date: 08/19/2020 Total lbs lost to date: 8 Total lbs lost since last in-office visit: 4  Interim History: Travis Owens is feeling motivated with weight loss. He has been working in the yard more consistently. He is following rhe plan ~50% of the time- trying to focus on protein intake. He has been also working with cardiology to change medication.  Subjective:   1. Type 2 diabetes mellitus with hyperglycemia, without long-term current use of insulin ([name]) Akoni drank lemonade this morning. FBS 170 this morning. He is on Glucophage and [id number].  2. Hypertension associated with diabetes [facility]) Working with cardiology and pharmacist. Pt is on Entresto, Toprol, Cozaar, Zetia, and Lipitor.  Assessment/Plan:   1. Type 2 diabetes mellitus with hyperglycemia, without long-term current use of insulin ([name]) Good blood sugar control is important to decrease the likelihood of diabetic complications such as nephropathy, neuropathy, limb loss, blindness, coronary artery disease, and death. Intensive lifestyle modification including diet, exercise and weight loss are the first line of treatment for diabetes. Continue current treatment plan.  2. Hypertension associated with diabetes ([place]) Good blood sugar control is important to decrease the likelihood of diabetic complications such as nephropathy, neuropathy, limb loss, blindness, coronary artery disease, and death. Intensive lifestyle modification including diet, exercise and weight loss are the first line of treatment for diabetes.  Follow up with cardiology.  3. Class 1 obesity with serious comorbidity and body mass index (BMI) of 33.0 to 33.9 in adult, unspecified obesity type Travis Owens is currently in the action stage of change. As such, his goal is to continue with weight loss efforts. He has agreed to the Category 4 Plan.   Exercise goals: As is  Behavioral modification strategies: increasing lean protein intake, meal planning and cooking strategies, keeping healthy foods in the home and planning for success.  Tellis has agreed to follow-up with our clinic in 2-3 weeks. He was informed of the importance of frequent follow-up visits to maximize his success with intensive lifestyle modifications for his multiple health conditions.   Objective:   Blood pressure 100/64, pulse 85, temperature 97.7 F (36.5 C), height 5\' 10"  (1.778 m), weight 212 lb (96.2 kg), SpO2 98 %. Body mass index is 30.42 kg/m.  General: Cooperative, alert, well developed, in no acute distress. HEENT: Conjunctivae and lids unremarkable. Cardiovascular: Regular rhythm.  Lungs: Normal work of breathing. Neurologic: No focal deficits.   Lab Results  Component Value Date   CREATININE 0.84 07/30/2020   BUN 15 07/30/2020   NA 138 07/30/2020   K 4.8 07/30/2020   CL 98 07/30/2020   CO2 23 07/30/2020   Lab Results  Component Value Date   ALT 26 09/13/2019   AST 23 09/13/2019   ALKPHOS 60 09/13/2019   BILITOT 0.6 09/13/2019   Lab Results  Component Value Date   HGBA1C 10.7 (A) 06/18/2020   HGBA1C 10.6 (A) 03/18/2020   HGBA1C 7.5 (A) 09/13/2019   HGBA1C 9.1 (A) 06/21/2019   HGBA1C 9.5 (H) 04/30/2019   Lab Results  Component Value Date  INSULIN 24.2 04/30/2019   Lab Results  Component Value Date   TSH 1.33 09/13/2019   Lab Results  Component Value Date   CHOL 139 09/13/2019   HDL 49.40 09/13/2019   LDLCALC 57 09/13/2019   TRIG 166.0 (H) 09/13/2019   CHOLHDL 3 09/13/2019   Lab Results  Component Value Date   WBC 8.7  09/13/2019   HGB 14.2 09/13/2019   HCT 42.7 09/13/2019   MCV 90.2 09/13/2019   PLT 276.0 09/13/2019    Attestation Statements:   Reviewed by clinician on day of visit: allergies, medications, problem list, medical history, surgical history, family history, social history, and previous encounter notes.  Time spent on visit including pre-visit chart review and post-visit care and charting was 15 minutes.   Coral Ceo, am acting as transcriptionist for Coralie Common, MD.   I have reviewed the above documentation for accuracy and completeness, and I agree with the above. - Jinny Blossom, MD

## 2020-08-25 ENCOUNTER — Other Ambulatory Visit (HOSPITAL_COMMUNITY): Payer: Self-pay

## 2020-08-27 ENCOUNTER — Other Ambulatory Visit (HOSPITAL_COMMUNITY): Payer: Self-pay

## 2020-08-27 ENCOUNTER — Ambulatory Visit (INDEPENDENT_AMBULATORY_CARE_PROVIDER_SITE_OTHER): Payer: 59 | Admitting: Pharmacist

## 2020-08-27 ENCOUNTER — Other Ambulatory Visit: Payer: Self-pay | Admitting: Family Medicine

## 2020-08-27 ENCOUNTER — Other Ambulatory Visit: Payer: Self-pay

## 2020-08-27 VITALS — BP 104/70 | HR 78 | Wt 216.0 lb

## 2020-08-27 DIAGNOSIS — I5022 Chronic systolic (congestive) heart failure: Secondary | ICD-10-CM | POA: Insufficient documentation

## 2020-08-27 DIAGNOSIS — I5042 Chronic combined systolic (congestive) and diastolic (congestive) heart failure: Secondary | ICD-10-CM

## 2020-08-27 MED ORDER — ENTRESTO 24-26 MG PO TABS
1.0000 | ORAL_TABLET | Freq: Two times a day (BID) | ORAL | 1 refills | Status: DC
  Filled 2020-08-27: qty 180, 90d supply, fill #0
  Filled 2020-11-17: qty 180, 90d supply, fill #1

## 2020-08-27 MED ORDER — EMPAGLIFLOZIN 10 MG PO TABS
10.0000 mg | ORAL_TABLET | Freq: Every day | ORAL | 5 refills | Status: DC
  Filled 2020-08-27: qty 90, 90d supply, fill #0
  Filled 2020-11-28: qty 90, 90d supply, fill #1

## 2020-08-27 MED ORDER — PANTOPRAZOLE SODIUM 40 MG PO TBEC
40.0000 mg | DELAYED_RELEASE_TABLET | Freq: Every day | ORAL | 3 refills | Status: DC
  Filled 2020-09-07 – 2020-10-23 (×2): qty 90, 90d supply, fill #0
  Filled 2021-02-03: qty 90, 90d supply, fill #1
  Filled 2021-05-04: qty 90, 90d supply, fill #2

## 2020-08-27 MED FILL — Pantoprazole Sodium EC Tab 40 MG (Base Equiv): ORAL | 60 days supply | Qty: 60 | Fill #0 | Status: AC

## 2020-08-27 NOTE — Patient Instructions (Addendum)
Start Jardiance 10mg  once daily  If the pharmacy does not have your Jardiance copay card still on file, call #[phone] to get your copay card information  Continue taking metoprolol 200mg  once daily and Entresto 24-26mg  1 tablet twice daily  Continue checking your blood pressure at home  Stay hydrated with water  Recheck lab work in 2-3 at [facility] in [place]. You do not need to be fasting for them  Keep your follow up appointment with Dr Gardiner Rhyme next month  Call Jinny Blossom, PharmD with any questions [phone]

## 2020-08-27 NOTE — Progress Notes (Signed)
Patient ID: Travis Owens                 DOB: 1955-08-09                      MRN: [id number]     HPI: Travis Owens is a 65 y.o. male referred by Dr. Gardiner Owens to pharmacy clinic for HF medication management. PMH is significant for chronic combined systolic and diastolic HF, A1OI, HTN, HLD, OSA, and LBBB. TTE 05/03/19 showed EF 40-45% with marked septal-lateral dyssynchrony consistent with LBBB. Coronary CTA 06/06/19 showed nonobstructive CAD (calcified plaque in the RPDA causing mild (25-49%) stenosis and calcified plaque in the proximal LAD and proximal RCA causing minimal (0-24%) stenosis).  Calcium score 111 (62nd percentile for age/gender).  CMR on 07/25/2019 showed EF 45%, no LGE, RV EF 37%. At last visit with Dr Travis Owens on 07/14/20, pt was changed from losartan to Entresto 24-26mg  BID and referred to HF clinic for further med management. I saw pt in clinic on 4/7 and increased his Toprol to 150mg  then 200mg  daily. During follow up phone call, BPs were stable and his Entresto was increased to 49-51mg  BID as well. Pt presents today for follow up.  Pt presents today in good spirits. He reports tolerating higher dose of Toprol well. He never increased his Entresto dose and isn't sure why. Home BP readings have been on the softer end even on lower dose of Entresto. His wife is an Therapist, sports at [employer] and has checked his BP a few times. Denies swelling, has had 1 episode of dizziness when he was sitting inside. Has been feeling well when he works out in his yard, no episodes of dizziness then. Occasionally has a headache, ibuprofen helps.  Pt was changed from [place] to [facility] by [facility] to help with further weight loss, he has since titrated to the 2.4mg  dose over the past month and is tolerating well. Glucose readings have improved to low of 170 in the AM. [name] was 10.7% earlier this year.  Current CHF meds: Toprol 200mg  daily, Entresto 49-51mg  BID Previously tried: losartan - changed  to Entresto BP goal: <130/10mmHg  Family History: Cancer in his father; Hypertension in his paternal grandmother; Stroke in his father and paternal grandfather. There is no history of Colon cancer, Colon polyps, Esophageal cancer, Rectal cancer, or Stomach cancer.  Social History: Denies tobacco, alcohol, and drug use.  Diet: sees [facility] - has him on a diet. Portions smaller since he's been on Wegovy  Exercise: walking more - walks a 20 minute mile. Doing a lot of yard work as well. Goal is to walk 30 minutes 6 days a week  Home BP readings: 101/63, 107/64, 103/64, 108/67, 92/63. HR 84, 83, 84, 77, 78  Wt Readings from Last 3 Encounters:  08/19/20 212 lb (96.2 kg)  07/30/20 219 lb (99.3 kg)  07/28/20 216 lb (98 kg)   BP Readings from Last 3 Encounters:  08/19/20 100/64  07/30/20 126/80  07/28/20 106/65   Pulse Readings from Last 3 Encounters:  08/19/20 85  07/30/20 88  07/28/20 81    Renal function: CrCl cannot be calculated (Patient's most recent lab result is older than the maximum 21 days allowed.).  Past Medical History:  Diagnosis Date  . Anxiety   . Chest pain   . Depression   . Diabetes mellitus without complication ([place])   . Fatty liver   .  GERD (gastroesophageal reflux disease)   . Hyperlipidemia   . Hypertension   . Sleep apnea    has CPAP/does not use    Current Outpatient Medications on File Prior to Visit  Medication Sig Dispense Refill  . aspirin EC 81 MG tablet Take 81 mg by mouth daily.    Marland Kitchen atorvastatin (LIPITOR) 80 MG tablet TAKE 1 TABLET BY MOUTH ONCE DAILY AT 6 PM 90 tablet 3  . Blood Glucose Monitoring Suppl (FREESTYLE LITE) DEVI Check Blood sugar twice daily 1 Device 0  . escitalopram (LEXAPRO) 20 MG tablet TAKE 1 TABLET (20 MG TOTAL) BY MOUTH DAILY. 90 tablet 3  . ezetimibe (ZETIA) 10 MG tablet TAKE 1 TABLET (10 MG TOTAL) BY MOUTH DAILY. 90 tablet 3  . glucose blood (FREESTYLE LITE) test strip Check Blood sugar  twice daily 100 each 12  . Lancets (FREESTYLE) lancets Check blood sugar twice daily 100 each 12  . metFORMIN (GLUCOPHAGE) 1000 MG tablet TAKE 1 TABLET (1,000 MG TOTAL) BY MOUTH 2 (TWO) TIMES DAILY. 180 tablet 3  . metoprolol (TOPROL-XL) 200 MG 24 hr tablet Take 1 tablet (200 mg total) by mouth daily. 90 tablet 3  . Multiple Vitamin (MULTIVITAMIN) tablet Take 1 tablet by mouth daily.    . pantoprazole (PROTONIX) 40 MG tablet TAKE 1 TABLET (40 MG TOTAL) BY MOUTH DAILY. 90 tablet 3  . sacubitril-valsartan (ENTRESTO) 49-51 MG Take 1 tablet by mouth 2 (two) times daily. 60 tablet 5  . Semaglutide-Weight Management (WEGOVY) 2.4 MG/0.75ML SOAJ Inject 2.4 mg into the skin once a week. 3 mL 0  . Vitamin D, Ergocalciferol, (DRISDOL) 1.25 MG (50000 UNIT) CAPS capsule TAKE 1 CAPSULE (50,000 UNITS TOTAL) BY MOUTH EVERY 7 (SEVEN) DAYS. (Patient taking differently: Take 50,000 Units by mouth once a week.) 4 capsule 0  . Zoster Vaccine Adjuvanted [facility]) injection TO BE ADMINISTERED BY PHARMACIST 1 each 1  . [DISCONTINUED] losartan (COZAAR) 100 MG tablet Take 0.5 tablets (50 mg total) by mouth daily. 30 tablet 0   No current facility-administered medications on file prior to visit.    No Known Allergies   Assessment/Plan:  1. CHF - BP at goal <130/82mmHg in clinic, on the softer side with both clinic and home readings. Will continue Toprol 200mg  daily and Entresto 24-26mg  BID. Unable to titrate Entresto or add spironolactone due to low BP, K also 4.8. Again discussed adding Jardiance which he previously took for DM but stopped secondary to concerns over mycotic infections, he had not experienced any though. His DM is not well controlled currently as his [name] is 10.7%. Discussed that restarting Jardiance would benefit his CHF, DM, and provide CV risk reduction as well. He is willing to resume now, will start Jardiance 10mg  daily. Recheck BMET in 2-3 weeks at [place] near his home. He is aware to stay well  hydrated and continue monitoring BP at home. He will keep f/u with Dr Travis Owens next month. Follow up with PharmD as needed.  Flemon Kelty E. Nora Sabey, PharmD, BCACP, [place] [address], [place], [place] [address] Phone: [phone]; Fax: [phone] 08/27/2020 8:30 AM

## 2020-09-07 ENCOUNTER — Other Ambulatory Visit (HOSPITAL_COMMUNITY): Payer: Self-pay

## 2020-09-10 ENCOUNTER — Other Ambulatory Visit: Payer: Self-pay

## 2020-09-10 ENCOUNTER — Other Ambulatory Visit (HOSPITAL_COMMUNITY): Payer: Self-pay

## 2020-09-10 ENCOUNTER — Encounter (INDEPENDENT_AMBULATORY_CARE_PROVIDER_SITE_OTHER): Payer: Self-pay | Admitting: Family Medicine

## 2020-09-10 ENCOUNTER — Ambulatory Visit (INDEPENDENT_AMBULATORY_CARE_PROVIDER_SITE_OTHER): Payer: 59 | Admitting: Family Medicine

## 2020-09-10 VITALS — BP 110/71 | HR 84 | Temp 97.9°F | Ht 70.0 in | Wt 211.0 lb

## 2020-09-10 DIAGNOSIS — Z6833 Body mass index (BMI) 33.0-33.9, adult: Secondary | ICD-10-CM

## 2020-09-10 DIAGNOSIS — E669 Obesity, unspecified: Secondary | ICD-10-CM

## 2020-09-10 DIAGNOSIS — Z9189 Other specified personal risk factors, not elsewhere classified: Secondary | ICD-10-CM | POA: Diagnosis not present

## 2020-09-10 DIAGNOSIS — E66811 Obesity, class 1: Secondary | ICD-10-CM

## 2020-09-10 DIAGNOSIS — R632 Polyphagia: Secondary | ICD-10-CM | POA: Diagnosis not present

## 2020-09-10 DIAGNOSIS — G4733 Obstructive sleep apnea (adult) (pediatric): Secondary | ICD-10-CM | POA: Diagnosis not present

## 2020-09-10 DIAGNOSIS — E1165 Type 2 diabetes mellitus with hyperglycemia: Secondary | ICD-10-CM | POA: Diagnosis not present

## 2020-09-10 MED ORDER — WEGOVY 2.4 MG/0.75ML ~~LOC~~ SOAJ
2.4000 mg | SUBCUTANEOUS | 0 refills | Status: DC
  Filled 2020-09-10 – 2020-09-22 (×2): qty 3, 28d supply, fill #0

## 2020-09-16 ENCOUNTER — Ambulatory Visit: Payer: 59 | Admitting: Family Medicine

## 2020-09-18 ENCOUNTER — Other Ambulatory Visit (HOSPITAL_COMMUNITY): Payer: Self-pay

## 2020-09-22 ENCOUNTER — Other Ambulatory Visit (HOSPITAL_COMMUNITY): Payer: Self-pay

## 2020-09-22 MED FILL — Zoster Vac Recombinant Adjuvanted for IM Inj 50 MCG/0.5ML: INTRAMUSCULAR | 1 days supply | Qty: 1 | Fill #0 | Status: CN

## 2020-09-22 NOTE — Progress Notes (Signed)
Chief Complaint:   OBESITY Travis Owens is here to discuss his progress with his obesity treatment plan along with follow-up of his obesity related diagnoses. Travis Owens is on the Category 4 Plan and states he is following his eating plan approximately 50% of the time. Travis Owens states he is working in the yard 4-6 hours 2 times per week.  Today's visit was #: 21 Starting weight: 220 lbs Starting date: 04/30/2019 Today's weight: 211 lbs Today's date: 09/10/2020 Total lbs lost to date: 9 Total lbs lost since last in-office visit: 1  Interim History: Travis Owens has been working in the yard and trying to adhere to the meal plan as strictly as he can. Food wise, he is around 50% compliant. He has eaten chicken, gravy, potato, grits; sometimes getting [employer] double for dinner. He plans to attend a special service for [facility] Day.  Subjective:   1. Polyphagia Travis Owens is on Wegovy 2.4 mg subq. He has no side effects of the medication.  2. Type 2 diabetes mellitus with hyperglycemia, without long-term current use of insulin ([name]) FBS ~170. Travis Owens denies feelings of hypoglycemia. He is on Jardiance, Metformin, and GLP-1.  3. At risk for deficient intake of food Travis Owens is at risk for deficient intake of food due to not eating all food on plan.  Assessment/Plan:   1. Polyphagia Intensive lifestyle modifications are the first line treatment for this issue. We discussed several lifestyle modifications today and he will continue to work on diet, exercise and weight loss efforts. Orders and follow up as documented in patient record.  Counseling . Polyphagia is excessive hunger. . Causes can include: low blood sugars, hypERthyroidism, PMS, lack of sleep, stress, insulin resistance, diabetes, certain medications, and diets that are deficient in protein and fiber.  - Semaglutide-Weight Management (WEGOVY) 2.4 MG/0.75ML SOAJ; Inject 2.4 mg into the skin once a week.  Dispense: 3 mL; Refill: 0  2. Type 2 diabetes mellitus  with hyperglycemia, without long-term current use of insulin ([name]) Good blood sugar control is important to decrease the likelihood of diabetic complications such as nephropathy, neuropathy, limb loss, blindness, coronary artery disease, and death. Intensive lifestyle modification including diet, exercise and weight loss are the first line of treatment for diabetes. Continue current treatment plan and repeat labs in 2 months.  3. At risk for deficient intake of food Travis Owens was given approximately 15 minutes of deficit intake of food prevention counseling today. Travis Owens is at risk for eating too few calories based on current food recall. He was encouraged to focus on meeting caloric and protein goals according to his recommended meal plan.   4. Obesity with current BMI of 30.4 Travis Owens is currently in the action stage of change. As such, his goal is to continue with weight loss efforts. He has agreed to the Category 4 Plan.   Exercise goals: All adults should avoid inactivity. Some physical activity is better than none, and adults who participate in any amount of physical activity gain some health benefits. Walking 3 times a week  Behavioral modification strategies: increasing lean protein intake, decreasing eating out, meal planning and cooking strategies and keeping healthy foods in the home.  Travis Owens has agreed to follow-up with our clinic in 3 weeks. He was informed of the importance of frequent follow-up visits to maximize his success with intensive lifestyle modifications for his multiple health conditions.   Objective:   Blood pressure 110/71, pulse 84, temperature 97.9 F (36.6 C), height 5\' 10"  (1.778 m), weight  211 lb (95.7 kg), SpO2 96 %. Body mass index is 30.28 kg/m.  General: Cooperative, alert, well developed, in no acute distress. HEENT: Conjunctivae and lids unremarkable. Cardiovascular: Regular rhythm.  Lungs: Normal work of breathing. Neurologic: No focal deficits.   Lab Results   Component Value Date   CREATININE 0.84 07/30/2020   BUN 15 07/30/2020   NA 138 07/30/2020   K 4.8 07/30/2020   CL 98 07/30/2020   CO2 23 07/30/2020   Lab Results  Component Value Date   ALT 26 09/13/2019   AST 23 09/13/2019   ALKPHOS 60 09/13/2019   BILITOT 0.6 09/13/2019   Lab Results  Component Value Date   HGBA1C 10.7 (A) 06/18/2020   HGBA1C 10.6 (A) 03/18/2020   HGBA1C 7.5 (A) 09/13/2019   HGBA1C 9.1 (A) 06/21/2019   HGBA1C 9.5 (H) 04/30/2019   Lab Results  Component Value Date   INSULIN 24.2 04/30/2019   Lab Results  Component Value Date   TSH 1.33 09/13/2019   Lab Results  Component Value Date   CHOL 139 09/13/2019   HDL 49.40 09/13/2019   LDLCALC 57 09/13/2019   TRIG 166.0 (H) 09/13/2019   CHOLHDL 3 09/13/2019   Lab Results  Component Value Date   WBC 8.7 09/13/2019   HGB 14.2 09/13/2019   HCT 42.7 09/13/2019   MCV 90.2 09/13/2019   PLT 276.0 09/13/2019   No results found for: IRON, TIBC, FERRITIN   Attestation Statements:   Reviewed by clinician on day of visit: allergies, medications, problem list, medical history, surgical history, family history, social history, and previous encounter notes.  Coral Ceo, CMA, am acting as transcriptionist for Coralie Common, MD.   I have reviewed the above documentation for accuracy and completeness, and I agree with the above. - Jinny Blossom, MD

## 2020-09-23 ENCOUNTER — Other Ambulatory Visit (HOSPITAL_COMMUNITY): Payer: Self-pay

## 2020-09-23 ENCOUNTER — Telehealth: Payer: Self-pay | Admitting: Pharmacist

## 2020-09-23 NOTE — Telephone Encounter (Signed)
Left message for pt to remind him to have BMET drawn at [facility], order already placed.

## 2020-09-25 ENCOUNTER — Other Ambulatory Visit (HOSPITAL_COMMUNITY): Payer: Self-pay

## 2020-09-25 DIAGNOSIS — I5042 Chronic combined systolic (congestive) and diastolic (congestive) heart failure: Secondary | ICD-10-CM | POA: Diagnosis not present

## 2020-09-26 LAB — BASIC METABOLIC PANEL
BUN/Creatinine Ratio: 25 — ABNORMAL HIGH (ref 10–24)
BUN: 24 mg/dL (ref 8–27)
CO2: 25 mmol/L (ref 20–29)
Calcium: 10.3 mg/dL — ABNORMAL HIGH (ref 8.6–10.2)
Chloride: 100 mmol/L (ref 96–106)
Creatinine, Ser: 0.96 mg/dL (ref 0.76–1.27)
Glucose: 144 mg/dL — ABNORMAL HIGH (ref 65–99)
Potassium: 5 mmol/L (ref 3.5–5.2)
Sodium: 140 mmol/L (ref 134–144)
eGFR: 88 mL/min/{1.73_m2} (ref >59)

## 2020-09-28 NOTE — Telephone Encounter (Signed)
BMET checked after pt recently started Jardiance for CHF and DM. Labs are stable and glucose has improved - pt reports he wasn't fasting for lab work either. He is tolerating addition of Jardiance well. Unable to add spironolactone or increase Entresto due to soft BP and K of 5. Pt aware to continue current meds for his CHF including Toprol 200mg  daily, Entresto 24-26mg  BID, and Jardiance 10mg  daily, and will keep follow up with Dr Gardiner Rhyme later this month.

## 2020-09-29 ENCOUNTER — Ambulatory Visit: Payer: 59 | Admitting: Family Medicine

## 2020-10-02 ENCOUNTER — Other Ambulatory Visit (HOSPITAL_COMMUNITY): Payer: Self-pay

## 2020-10-02 DIAGNOSIS — H52203 Unspecified astigmatism, bilateral: Secondary | ICD-10-CM | POA: Diagnosis not present

## 2020-10-02 LAB — HM DIABETES EYE EXAM

## 2020-10-02 MED FILL — Zoster Vac Recombinant Adjuvanted for IM Inj 50 MCG/0.5ML: INTRAMUSCULAR | 1 days supply | Qty: 1 | Fill #0 | Status: AC

## 2020-10-06 ENCOUNTER — Encounter: Payer: Self-pay | Admitting: Family Medicine

## 2020-10-07 ENCOUNTER — Ambulatory Visit (INDEPENDENT_AMBULATORY_CARE_PROVIDER_SITE_OTHER): Payer: 59 | Admitting: Family Medicine

## 2020-10-11 DIAGNOSIS — G4733 Obstructive sleep apnea (adult) (pediatric): Secondary | ICD-10-CM | POA: Diagnosis not present

## 2020-10-11 NOTE — Progress Notes (Deleted)
Cardiology Office Note:    Date:  10/16/2020   ID:  Travis Owens, DOB 1955-06-12, MRN [id number]  PCP:  [name], MD  Cardiologist:  Donato Heinz, MD  Electrophysiologist:  None   Referring MD: [name], MD   Chief Complaint  Patient presents with   Follow-up    3 months.     History of Present Illness:    Travis Owens is a 65 y.o. male with a hx of chronic combined systolic and diastolic heart failure, type 2 diabetes, hypertension, hyperlipidemia, OSA who present for follow-up.  He was is referred by Dr. Leafy Ro for evaluation of left bundle branch block on 05/01/18.  He was referred to Dr. Leafy Ro for weight loss evaluation, an EKG was done which showed left bundle branch block.  TTE was done on 05/03/2019 and showed EF 40 to 45%, with marked marked septal-lateral dyssynchrony consistent with LBBB.  Coronary CTA on 06/06/2019 showed nonobstructive CAD (calcified plaque in the RPDA causing mild (25-49%) stenosis and calcified plaque in the proximal LAD and proximal RCA causing minimal (0-24%) stenosis).  Calcium score 111 (62nd percentile for age/gender).  CMR on 07/25/2019 showed EF 45%, no LGE, RV EF 37%.  Since last clinic visit,  he reports that he has been doing well.  Denies any chest pain, dyspnea, lightheadedness, syncope, lower extremity edema, or palpitations.  Reports has been compliant with his BiPAP.  Has been walking couple times a week for 20 minutes, denies any exertional symptoms.   BP Readings from Last 3 Encounters:  10/16/20 (!) 106/58  09/10/20 110/71  08/27/20 104/70     Wt Readings from Last 3 Encounters:  10/16/20 206 lb (93.4 kg)  09/10/20 211 lb (95.7 kg)  08/27/20 216 lb (98 kg)      Past Medical History:  Diagnosis Date   Anxiety    Chest pain    Depression    Diabetes mellitus without complication ([place])    Fatty liver    GERD (gastroesophageal reflux disease)    Hyperlipidemia    Hypertension    Sleep apnea     has CPAP/does not use    Past Surgical History:  Procedure Laterality Date   COLONOSCOPY     Dr. Olevia Perches  2008    Current Medications: Current Meds  Medication Sig   aspirin EC 81 MG tablet Take 81 mg by mouth daily.   atorvastatin (LIPITOR) 80 MG tablet TAKE 1 TABLET BY MOUTH ONCE DAILY AT 6 PM   Blood Glucose Monitoring Suppl (FREESTYLE LITE) DEVI Check Blood sugar twice daily   empagliflozin (JARDIANCE) 10 MG TABS tablet Take 1 tablet (10 mg total) by mouth daily before breakfast.   escitalopram (LEXAPRO) 20 MG tablet TAKE 1 TABLET (20 MG TOTAL) BY MOUTH DAILY.   ezetimibe (ZETIA) 10 MG tablet TAKE 1 TABLET (10 MG TOTAL) BY MOUTH DAILY.   glucose blood (FREESTYLE LITE) test strip Check Blood sugar twice daily   Lancets (FREESTYLE) lancets Check blood sugar twice daily   metFORMIN (GLUCOPHAGE) 1000 MG tablet TAKE 1 TABLET (1,000 MG TOTAL) BY MOUTH 2 (TWO) TIMES DAILY.   metoprolol (TOPROL-XL) 200 MG 24 hr tablet Take 1 tablet (200 mg total) by mouth daily.   Multiple Vitamin (MULTIVITAMIN) tablet Take 1 tablet by mouth daily.   pantoprazole (PROTONIX) 40 MG tablet Take 1 tablet (40 mg total) by mouth daily.   sacubitril-valsartan (ENTRESTO) 24-26 MG Take 1 tablet by mouth 2 (two) times daily.  Semaglutide-Weight Management (WEGOVY) 2.4 MG/0.75ML SOAJ Inject 2.4 mg into the skin once a week.   Vitamin D, Ergocalciferol, (DRISDOL) 1.25 MG (50000 UNIT) CAPS capsule TAKE 1 CAPSULE (50,000 UNITS TOTAL) BY MOUTH EVERY 7 (SEVEN) DAYS. (Patient taking differently: Take 50,000 Units by mouth once a week.)   Zoster Vaccine Adjuvanted (SHINGRIX) injection TO BE ADMINISTERED BY PHARMACIST     Allergies:   Patient has no known allergies.   Social History   Socioeconomic History   Marital status: Married    Spouse name: Adeeb Konecny   Number of children: Not on file   Years of education: Not on file   Highest education level: Not on file  Occupational History   Occupation: Theme park manager     Comment: [employer]  Tobacco Use   Smoking status: Never   Smokeless tobacco: Never  Vaping Use   Vaping Use: Never used  Substance and Sexual Activity   Alcohol use: No   Drug use: No   Sexual activity: Yes  Other Topics Concern   Not on file  Social History Narrative   Not on file   Social Determinants of Health   Financial Resource Strain: Not on file  Food Insecurity: Not on file  Transportation Needs: Not on file  Physical Activity: Not on file  Stress: Not on file  Social Connections: Not on file     Family History: The patient's family history includes Cancer in his father; Hypertension in his paternal grandmother; Stroke in his father and paternal grandfather. There is no history of Colon cancer, Colon polyps, Esophageal cancer, Rectal cancer, or Stomach cancer.  ROS:   Please see the history of present illness.     All other systems reviewed and are negative.  EKGs/Labs/Other Studies Reviewed:    The following studies were reviewed today:   EKG:  EKG is ordered today.  The last ekg demonstrates normal sinus rhythm, rate 86, left bundle branch block  TTE 05/03/19:  1. Left ventricular ejection fraction, by visual estimation, is 40 to 45%. The left ventricle has mild to moderately decreased function. There is mildly increased left ventricular hypertrophy. There is marked septal-lateral dyssynchrony consistent with  LBBB.  2. Left ventricular diastolic parameters are consistent with Grade I diastolic dysfunction (impaired relaxation).  3. Global right ventricle has normal systolic function.The right ventricular size is normal. No increase in right ventricular wall thickness.  4. Left atrial size was normal.  5. Right atrial size was normal.  6. The mitral valve is normal in structure. No evidence of mitral valve regurgitation. No evidence of mitral stenosis.  7. The tricuspid valve is normal in structure.  8. The aortic valve is tricuspid. Aortic valve  regurgitation is not visualized. Mild aortic valve sclerosis without stenosis  9. TR signal is inadequate for assessing pulmonary artery systolic pressure. 10. The inferior vena cava is normal in size with greater than 50% respiratory variability, suggesting right atrial pressure of 3 mmHg.  Coronry CTA 06/06/19: 1. Coronary calcium score of 111. This was 64 percentile for age and sex matched control.   2. Normal coronary origin with right dominance.   3. Nonobstructive CAD. There is calcified plaque in the RPDA causing mild (25-49%) stenosis and calcified plaque in the proximal LAD and proximal RCA causing minimal (0-24%) stenosis   CAD-RADS 2. Mild non-obstructive CAD (25-49%). Consider non-atherosclerotic causes of chest pain. Consider preventive therapy and risk factor modification.1. Coronary calcium score of 111. This was 62 percentile  for age and sex matched control.   2. Normal coronary origin with right dominance.   3. Nonobstructive CAD. There is calcified plaque in the RPDA causing mild (25-49%) stenosis and calcified plaque in the proximal LAD and proximal RCA causing minimal (0-24%) stenosis   CAD-RADS 2. Mild non-obstructive CAD (25-49%). Consider non-atherosclerotic causes of chest pain. Consider preventive therapy and risk factor modification.  Noncardiac: IMPRESSION: No significant noncardiac findings. Incidental 6 mm densely calcified granuloma in the lingula.  CMR 07/25/19: 1. Normal LV size and thickness with markedly abnormal septal motion EF 45%. 2.  Possible LV non compaction seen best in SA/3 chamber views 3. No delayed gadolinium uptake, scar, infiltration, infarct in LV myocardium 4.  Basal RV dilatation and hypokinesis RVEF 37% 5   normal MV,AV, TV 6.  Normal Aortic root 3.0 cm 7.  Normal T2* 48 msec 8. Unable to calculate T1/ECG failure to acquire adequate T1 map sequence pre contrast      Recent Labs: 09/25/2020: BUN 24; Creatinine, Ser 0.96;  Potassium 5.0; Sodium 140  Recent Lipid Panel    Component Value Date/Time   CHOL 139 09/13/2019 0941   CHOL 163 04/30/2019 1145   TRIG 166.0 (H) 09/13/2019 0941   HDL 49.40 09/13/2019 0941   HDL 55 04/30/2019 1145   CHOLHDL 3 09/13/2019 0941   VLDL 33.2 09/13/2019 0941   LDLCALC 57 09/13/2019 0941   LDLCALC 75 04/30/2019 1145    Physical Exam:    VS:  BP (!) 106/58 (BP Location: Left Arm, Patient Position: Sitting, Cuff Size: Normal)   Pulse 76   Ht 5\' 10"  (1.778 m)   Wt 206 lb (93.4 kg)   BMI 29.56 kg/m     Wt Readings from Last 3 Encounters:  10/16/20 206 lb (93.4 kg)  09/10/20 211 lb (95.7 kg)  08/27/20 216 lb (98 kg)     GEN:   in no acute distress HEENT: Normal NECK: No JVD CARDIAC:RRR, no murmurs, rubs, gallops RESPIRATORY:  Clear to auscultation without rales, wheezing or rhonchi  ABDOMEN: Soft, non-tender, non-distended MUSCULOSKELETAL:  No edema; No deformity  SKIN: Warm and dry NEUROLOGIC:  Alert and oriented x 3 PSYCHIATRIC:  Normal affect   ASSESSMENT:    No diagnosis found.  PLAN:    Chronic combined systolic and diastolic heart failure: EF 40 to 45% on TTE from 05/02/18, with marked septal-lateral dyssynchrony consistent with LBBB.  Coronary CTA showed nonobstructive CAD.  CMR showed EF 45%, no LGE.  Appears euvolemic.  Suspect systolic dysfunction due to LBBB -Continue Entresto 24/26 mg twice daily -Continue Toprol-XL 200 mg daily -Continue Jardiance 10 mg daily -Check BMP/magnesium.  Soft BP, will hold off on spironolactone  Coronary artery disease: coronary CTA on 06/06/2019 showed nonobstructive CAD (calcified plaque in the RPDA causing mild (25-49%) stenosis and calcified plaque in the proximal LAD and proximal RCA causing minimal (0-24%) stenosis.  Calcium score 111 (62nd percentile for age/gender). -Continue atorvastatin 80 mg daily -Stopped taking aspirin 81 mg, can hold off fot noe  Hypertension: Continue Toprol-XL, Entresto as  above  Hyperlipidemia: Continue atorvastatin 80 mg daily and Zetia 10 mg daily.  LDL 57 on 09/13/2019, at goal less than 70.  We will recheck lipid panel  Type 2 diabetes: [name] 7.5 on 09/13/19.   Worsened to 10.7% on 06/18/20.  On metformin, semaglutide.  Started on Jardiance 10 mg daily  OSA: Sleep study on 05/15/2019 confirmed OSA, has started on Bipap  RTC in 6 months  Medication Adjustments/Labs and Tests Ordered: Current medicines are reviewed at length with the patient today.  Concerns regarding medicines are outlined above.  No orders of the defined types were placed in this encounter.  No orders of the defined types were placed in this encounter.   There are no Patient Instructions on file for this visit.   Signed, Donato Heinz, MD  10/16/2020 9:58 AM     [facility]

## 2020-10-12 NOTE — Progress Notes (Signed)
Cardiology Office Note:    Date:  10/22/2020   ID:  Travis Owens, DOB 1955-06-28, MRN [id number]  PCP:  [name], MD  Cardiologist:  Donato Heinz, MD  Electrophysiologist:  None   Referring MD: [name], MD   Chief Complaint  Patient presents with   Follow-up    3 months.     History of Present Illness:    Travis Owens is a 65 y.o. male with a hx of chronic combined systolic and diastolic heart failure, type 2 diabetes, hypertension, hyperlipidemia, OSA who present for follow-up.  He was is referred by Dr. Leafy Ro for evaluation of left bundle branch block on 05/01/18.  He was referred to Dr. Leafy Ro for weight loss evaluation, an EKG was done which showed left bundle branch block.  TTE was done on 05/03/2019 and showed EF 40 to 45%, with marked marked septal-lateral dyssynchrony consistent with LBBB.  Coronary CTA on 06/06/2019 showed nonobstructive CAD (calcified plaque in the RPDA causing mild (25-49%) stenosis and calcified plaque in the proximal LAD and proximal RCA causing minimal (0-24%) stenosis).  Calcium score 111 (62nd percentile for age/gender).  CMR on 07/25/2019 showed EF 45%, no LGE, RV EF 37%.  Since last clinic visit, he has been feeling great overall and has lost 16 pounds in the last 3 months. For exercise he typically works in the yard. There has been no lightheadedness when bending over to pick up branches. He is also working with the [facility] to improve his diet. Lately he is eating more proteins and vegetables. He consistently uses his BiPAP. Recently he self-discontinued the 81 mg aspirin because he believes he does not need it. Next week he is scheduled to see his PCP. He denies any chest pain, shortness of breath, palpitations, or exertional symptoms. No headaches, or syncope to report. Also has no lower extremity edema, orthopnea or PND.   BP Readings from Last 3 Encounters:  10/21/20 108/69  10/20/20 114/70   10/16/20 (!) 106/58   Wt Readings from Last 3 Encounters:  10/21/20 208 lb (94.3 kg)  10/20/20 203 lb (92.1 kg)  10/16/20 206 lb (93.4 kg)    Past Medical History:  Diagnosis Date   Anxiety    Chest pain    Depression    Diabetes mellitus without complication ([place])    Fatty liver    GERD (gastroesophageal reflux disease)    Hyperlipidemia    Hypertension    Sleep apnea    has CPAP/does not use    Past Surgical History:  Procedure Laterality Date   COLONOSCOPY     Dr. Olevia Perches  2008    Current Medications: Current Meds  Medication Sig   atorvastatin (LIPITOR) 80 MG tablet TAKE 1 TABLET BY MOUTH ONCE DAILY AT 6 PM   Blood Glucose Monitoring Suppl (FREESTYLE LITE) DEVI Check Blood sugar twice daily   empagliflozin (JARDIANCE) 10 MG TABS tablet Take 1 tablet (10 mg total) by mouth daily before breakfast.   escitalopram (LEXAPRO) 20 MG tablet TAKE 1 TABLET (20 MG TOTAL) BY MOUTH DAILY.   ezetimibe (ZETIA) 10 MG tablet TAKE 1 TABLET (10 MG TOTAL) BY MOUTH DAILY.   glucose blood (FREESTYLE LITE) test strip Check Blood sugar twice daily   Lancets (FREESTYLE) lancets Check blood sugar twice daily   metFORMIN (GLUCOPHAGE) 1000 MG tablet TAKE 1 TABLET (1,000 MG TOTAL) BY MOUTH 2 (TWO) TIMES DAILY.   metoprolol (TOPROL-XL) 200 MG 24 hr  tablet Take 1 tablet (200 mg total) by mouth daily.   Multiple Vitamin (MULTIVITAMIN) tablet Take 1 tablet by mouth daily.   pantoprazole (PROTONIX) 40 MG tablet Take 1 tablet (40 mg total) by mouth daily.   sacubitril-valsartan (ENTRESTO) 24-26 MG Take 1 tablet by mouth 2 (two) times daily.   Vitamin D, Ergocalciferol, (DRISDOL) 1.25 MG (50000 UNIT) CAPS capsule TAKE 1 CAPSULE (50,000 UNITS TOTAL) BY MOUTH EVERY 7 (SEVEN) DAYS. (Patient taking differently: Take 50,000 Units by mouth once a week.)   Zoster Vaccine Adjuvanted (SHINGRIX) injection TO BE ADMINISTERED BY PHARMACIST   [DISCONTINUED] aspirin EC 81 MG tablet Take 81 mg by mouth daily.    [DISCONTINUED] Semaglutide-Weight Management (WEGOVY) 2.4 MG/0.75ML SOAJ Inject 2.4 mg into the skin once a week.     Allergies:   Patient has no known allergies.   Social History   Socioeconomic History   Marital status: Married    Spouse name: Cayden Granholm   Number of children: Not on file   Years of education: Not on file   Highest education level: Not on file  Occupational History   Occupation: Theme park manager    Comment: [place] [employer]  Tobacco Use   Smoking status: Never   Smokeless tobacco: Never  Vaping Use   Vaping Use: Never used  Substance and Sexual Activity   Alcohol use: No   Drug use: No   Sexual activity: Yes  Other Topics Concern   Not on file  Social History Narrative   Not on file   Social Determinants of Health   Financial Resource Strain: Not on file  Food Insecurity: Not on file  Transportation Needs: Not on file  Physical Activity: Not on file  Stress: Not on file  Social Connections: Not on file     Family History: The patient's family history includes Cancer in his father; Hypertension in his paternal grandmother; Stroke in his father and paternal grandfather. There is no history of Colon cancer, Colon polyps, Esophageal cancer, Rectal cancer, or Stomach cancer.  ROS:   Please see the history of present illness.    All other systems reviewed and are negative.  EKGs/Labs/Other Studies Reviewed:    The following studies were reviewed today:   EKG:   10/16/2020: EKG is not ordered today. 07/14/2020: normal sinus rhythm, rate 86, left bundle branch block  TTE 05/03/19:  1. Left ventricular ejection fraction, by visual estimation, is 40 to 45%. The left ventricle has mild to moderately decreased function. There is mildly increased left ventricular hypertrophy. There is marked septal-lateral dyssynchrony consistent with LBBB.  2. Left ventricular diastolic parameters are consistent with Grade I diastolic dysfunction (impaired relaxation).  3.  Global right ventricle has normal systolic function.The right ventricular size is normal. No increase in right ventricular wall thickness.  4. Left atrial size was normal.  5. Right atrial size was normal.  6. The mitral valve is normal in structure. No evidence of mitral valve regurgitation. No evidence of mitral stenosis.  7. The tricuspid valve is normal in structure.  8. The aortic valve is tricuspid. Aortic valve regurgitation is not visualized. Mild aortic valve sclerosis without stenosis  9. TR signal is inadequate for assessing pulmonary artery systolic pressure. 10. The inferior vena cava is normal in size with greater than 50% respiratory variability, suggesting right atrial pressure of 3 mmHg.  Coronary CTA 06/06/19: 1. Coronary calcium score of 111. This was 65 percentile for age and sex matched control.  2. Normal coronary origin with right dominance.   3. Nonobstructive CAD. There is calcified plaque in the RPDA causing mild (25-49%) stenosis and calcified plaque in the proximal LAD and proximal RCA causing minimal (0-24%) stenosis   CAD-RADS 2. Mild non-obstructive CAD (25-49%). Consider non-atherosclerotic causes of chest pain. Consider preventive therapy and risk factor modification.1. Coronary calcium score of 111. This was 64 percentile for age and sex matched control.   2. Normal coronary origin with right dominance.   3. Nonobstructive CAD. There is calcified plaque in the RPDA causing mild (25-49%) stenosis and calcified plaque in the proximal LAD and proximal RCA causing minimal (0-24%) stenosis   CAD-RADS 2. Mild non-obstructive CAD (25-49%). Consider non-atherosclerotic causes of chest pain. Consider preventive therapy and risk factor modification.  Noncardiac: IMPRESSION: No significant noncardiac findings. Incidental 6 mm densely calcified granuloma in the lingula.  CMR 07/25/19: 1. Normal LV size and thickness with markedly abnormal septal motion EF  45%. 2.  Possible LV non compaction seen best in SA/3 chamber views 3. No delayed gadolinium uptake, scar, infiltration, infarct in LV myocardium 4.  Basal RV dilatation and hypokinesis RVEF 37% 5   normal MV,AV, TV 6.  Normal Aortic root 3.0 cm 7.  Normal T2* 48 msec 8. Unable to calculate T1/ECG failure to acquire adequate T1 map sequence pre contrast      Recent Labs: 10/16/2020: BUN 21; Creatinine, Ser 0.75; Magnesium 1.6; Potassium 4.3; Sodium 140  Recent Lipid Panel    Component Value Date/Time   CHOL 127 10/16/2020 1022   TRIG 127 10/16/2020 1022   HDL 48 10/16/2020 1022   CHOLHDL 2.6 10/16/2020 1022   CHOLHDL 3 09/13/2019 0941   VLDL 33.2 09/13/2019 0941   LDLCALC 57 10/16/2020 1022    Physical Exam:    VS:  BP (!) 106/58 (BP Location: Left Arm, Patient Position: Sitting, Cuff Size: Normal)   Pulse 76   Ht 5\' 10"  (1.778 m)   Wt 206 lb (93.4 kg)   BMI 29.56 kg/m     Wt Readings from Last 3 Encounters:  10/21/20 208 lb (94.3 kg)  10/20/20 203 lb (92.1 kg)  10/16/20 206 lb (93.4 kg)     GEN:   in no acute distress HEENT: Normal NECK: No JVD CARDIAC:RRR, no murmurs, rubs, gallops RESPIRATORY:  Clear to auscultation without rales, wheezing or rhonchi  ABDOMEN: Soft, non-tender, non-distended MUSCULOSKELETAL:  No edema; No deformity  SKIN: Warm and dry NEUROLOGIC:  Alert and oriented x 3 PSYCHIATRIC:  Normal affect   ASSESSMENT:    1. Chronic combined systolic and diastolic heart failure ([place])   2. Essential hypertension   3. Coronary artery disease involving native coronary artery of native heart without angina pectoris   4. Hyperlipidemia with target LDL less than 70     PLAN:    Chronic combined systolic and diastolic heart failure: EF 40 to 45% on TTE from 05/02/18, with marked septal-lateral dyssynchrony consistent with LBBB.  Coronary CTA showed nonobstructive CAD.  CMR showed EF 45%, no LGE.  Appears euvolemic.  Suspect systolic dysfunction due to  LBBB -Continue Entresto 24/26 mg twice daily -Continue Toprol-XL 200 mg daily -Continue Jardiance 10 mg daily -Check BMP/magnesium.  Soft BP, will hold off on spironolactone  Coronary artery disease: coronary CTA on 06/06/2019 showed nonobstructive CAD (calcified plaque in the RPDA causing mild (25-49%) stenosis and calcified plaque in the proximal LAD and proximal RCA causing minimal (0-24%) stenosis.  Calcium score 111 (62nd percentile  for age/gender). -Continue atorvastatin 80 mg daily -Stopped taking aspirin 81 mg, can hold off for now  Hypertension: Continue Toprol-XL, Entresto as above  Hyperlipidemia: Continue atorvastatin 80 mg daily and Zetia 10 mg daily.  LDL 57 on 09/13/2019, at goal less than 70.  We will recheck lipid panel  Type 2 diabetes: [name] 7.5 on 09/13/19.   Worsened to 10.7% on 06/18/20.  On metformin, semaglutide.  Started on Jardiance 10 mg daily  OSA: Sleep study on 05/15/2019 confirmed OSA, has started on Bipap  RTC in 6 months  Medication Adjustments/Labs and Tests Ordered: Current medicines are reviewed at length with the patient today.  Concerns regarding medicines are outlined above.  Orders Placed This Encounter  Procedures   Basic metabolic panel   Magnesium   Lipid panel    No orders of the defined types were placed in this encounter.   Patient Instructions  Medication Instructions:  Your physician recommends that you continue on your current medications as directed. Please refer to the Current Medication list given to you today.  *If you need a refill on your cardiac medications before your next appointment, please call your pharmacy*   Lab Work: BMET, Mag, Lipid today  If you have labs (blood work) drawn today and your tests are completely normal, you will receive your results only by: [place] (if you have MyChart) OR A paper copy in the mail If you have any lab test that is abnormal or we need to change your treatment, we will call you  to review the results.  Follow-Up: At [facility], you and your health needs are our priority.  As part of our continuing mission to provide you with exceptional heart care, we have created designated Provider Care Teams.  These Care Teams include your primary Cardiologist (physician) and Advanced Practice Providers (APPs -  Physician Assistants and Nurse Practitioners) who all work together to provide you with the care you need, when you need it.  We recommend signing up for the patient portal called "MyChart".  Sign up information is provided on this After Visit Summary.  MyChart is used to connect with patients for Virtual Visits (Telemedicine).  Patients are able to view lab/test results, encounter notes, upcoming appointments, etc.  Non-urgent messages can be sent to your provider as well.   To learn more about what you can do with MyChart, go to [email or web address].    Your next appointment:   6 month(s)  The format for your next appointment:   In Person  Provider:   Oswaldo Milian, MD    [facility],acting as a scribe for Donato Heinz, MD.,have documented all relevant documentation on the behalf of Donato Heinz, MD,as directed by  Donato Heinz, MD while in the presence of Donato Heinz, MD.  I, Donato Heinz, MD, have reviewed all documentation for this visit. The documentation on 10/22/20 for the exam, diagnosis, procedures, and orders are all accurate and complete.   Signed, Donato Heinz, MD  10/22/2020 10:30 PM    [place]

## 2020-10-16 ENCOUNTER — Other Ambulatory Visit: Payer: Self-pay

## 2020-10-16 ENCOUNTER — Encounter: Payer: Self-pay | Admitting: Cardiology

## 2020-10-16 ENCOUNTER — Ambulatory Visit: Payer: 59 | Admitting: Cardiology

## 2020-10-16 VITALS — BP 106/58 | HR 76 | Ht 70.0 in | Wt 206.0 lb

## 2020-10-16 DIAGNOSIS — I5042 Chronic combined systolic (congestive) and diastolic (congestive) heart failure: Secondary | ICD-10-CM

## 2020-10-16 DIAGNOSIS — I10 Essential (primary) hypertension: Secondary | ICD-10-CM | POA: Diagnosis not present

## 2020-10-16 DIAGNOSIS — I2510 Atherosclerotic heart disease of native coronary artery without angina pectoris: Secondary | ICD-10-CM | POA: Diagnosis not present

## 2020-10-16 DIAGNOSIS — E785 Hyperlipidemia, unspecified: Secondary | ICD-10-CM | POA: Diagnosis not present

## 2020-10-16 LAB — BASIC METABOLIC PANEL
BUN/Creatinine Ratio: 28 — ABNORMAL HIGH (ref 10–24)
BUN: 21 mg/dL (ref 8–27)
CO2: 21 mmol/L (ref 20–29)
Calcium: 9.5 mg/dL (ref 8.6–10.2)
Chloride: 102 mmol/L (ref 96–106)
Creatinine, Ser: 0.75 mg/dL — ABNORMAL LOW (ref 0.76–1.27)
Glucose: 152 mg/dL — ABNORMAL HIGH (ref 65–99)
Potassium: 4.3 mmol/L (ref 3.5–5.2)
Sodium: 140 mmol/L (ref 134–144)
eGFR: 100 mL/min/{1.73_m2} (ref >59)

## 2020-10-16 LAB — MAGNESIUM: Magnesium: 1.6 mg/dL (ref 1.6–2.3)

## 2020-10-16 LAB — LIPID PANEL
Chol/HDL Ratio: 2.6 ratio (ref 0.0–5.0)
Cholesterol, Total: 127 mg/dL (ref 100–199)
HDL: 48 mg/dL (ref >39)
LDL Chol Calc (NIH): 57 mg/dL (ref 0–99)
Triglycerides: 127 mg/dL (ref 0–149)
VLDL Cholesterol Cal: 22 mg/dL (ref 5–40)

## 2020-10-16 NOTE — Patient Instructions (Signed)
Medication Instructions:  Your physician recommends that you continue on your current medications as directed. Please refer to the Current Medication list given to you today.  *If you need a refill on your cardiac medications before your next appointment, please call your pharmacy*   Lab Work: BMET, Mag, Lipid today  If you have labs (blood work) drawn today and your tests are completely normal, you will receive your results only by: [place] (if you have MyChart) OR A paper copy in the mail If you have any lab test that is abnormal or we need to change your treatment, we will call you to review the results.  Follow-Up: At [facility], you and your health needs are our priority.  As part of our continuing mission to provide you with exceptional heart care, we have created designated Provider Care Teams.  These Care Teams include your primary Cardiologist (physician) and Advanced Practice Providers (APPs -  Physician Assistants and Nurse Practitioners) who all work together to provide you with the care you need, when you need it.  We recommend signing up for the patient portal called "MyChart".  Sign up information is provided on this After Visit Summary.  MyChart is used to connect with patients for Virtual Visits (Telemedicine).  Patients are able to view lab/test results, encounter notes, upcoming appointments, etc.  Non-urgent messages can be sent to your provider as well.   To learn more about what you can do with MyChart, go to [email or web address].    Your next appointment:   6 month(s)  The format for your next appointment:   In Person  Provider:   Oswaldo Milian, MD

## 2020-10-19 ENCOUNTER — Other Ambulatory Visit (HOSPITAL_COMMUNITY): Payer: Self-pay

## 2020-10-20 ENCOUNTER — Other Ambulatory Visit (HOSPITAL_COMMUNITY): Payer: Self-pay

## 2020-10-20 ENCOUNTER — Other Ambulatory Visit: Payer: Self-pay

## 2020-10-20 ENCOUNTER — Encounter (INDEPENDENT_AMBULATORY_CARE_PROVIDER_SITE_OTHER): Payer: Self-pay | Admitting: Family Medicine

## 2020-10-20 ENCOUNTER — Ambulatory Visit (INDEPENDENT_AMBULATORY_CARE_PROVIDER_SITE_OTHER): Payer: 59 | Admitting: Family Medicine

## 2020-10-20 VITALS — BP 114/70 | HR 75 | Temp 98.1°F | Ht 70.0 in | Wt 203.0 lb

## 2020-10-20 DIAGNOSIS — E1165 Type 2 diabetes mellitus with hyperglycemia: Secondary | ICD-10-CM

## 2020-10-20 DIAGNOSIS — E669 Obesity, unspecified: Secondary | ICD-10-CM | POA: Diagnosis not present

## 2020-10-20 DIAGNOSIS — R632 Polyphagia: Secondary | ICD-10-CM

## 2020-10-20 DIAGNOSIS — I152 Hypertension secondary to endocrine disorders: Secondary | ICD-10-CM

## 2020-10-20 DIAGNOSIS — E1159 Type 2 diabetes mellitus with other circulatory complications: Secondary | ICD-10-CM

## 2020-10-20 DIAGNOSIS — Z6832 Body mass index (BMI) 32.0-32.9, adult: Secondary | ICD-10-CM | POA: Diagnosis not present

## 2020-10-20 DIAGNOSIS — E66811 Obesity, class 1: Secondary | ICD-10-CM

## 2020-10-20 DIAGNOSIS — Z9189 Other specified personal risk factors, not elsewhere classified: Secondary | ICD-10-CM

## 2020-10-20 MED ORDER — WEGOVY 2.4 MG/0.75ML ~~LOC~~ SOAJ
2.4000 mg | SUBCUTANEOUS | 0 refills | Status: DC
  Filled 2020-10-20: qty 3, 28d supply, fill #0

## 2020-10-21 ENCOUNTER — Encounter: Payer: Self-pay | Admitting: Family Medicine

## 2020-10-21 ENCOUNTER — Ambulatory Visit: Payer: 59 | Admitting: Family Medicine

## 2020-10-21 ENCOUNTER — Other Ambulatory Visit (HOSPITAL_COMMUNITY): Payer: Self-pay

## 2020-10-21 ENCOUNTER — Other Ambulatory Visit: Payer: Self-pay

## 2020-10-21 VITALS — BP 108/69 | HR 73 | Temp 97.6°F | Ht 70.0 in | Wt 208.0 lb

## 2020-10-21 DIAGNOSIS — E1165 Type 2 diabetes mellitus with hyperglycemia: Secondary | ICD-10-CM

## 2020-10-21 DIAGNOSIS — E1159 Type 2 diabetes mellitus with other circulatory complications: Secondary | ICD-10-CM | POA: Diagnosis not present

## 2020-10-21 DIAGNOSIS — I152 Hypertension secondary to endocrine disorders: Secondary | ICD-10-CM | POA: Diagnosis not present

## 2020-10-21 LAB — POCT GLYCOSYLATED HEMOGLOBIN (HGB A1C): Hemoglobin A1C: 7.2 % — AB (ref 4.0–5.6)

## 2020-10-21 NOTE — Assessment & Plan Note (Signed)
[name] significantly improved to 7.2.  He has been working hard on lifestyle modifications.  We will continue his current medication regimen with semaglutide 2.4 mg weekly, metformin 1000 mg twice daily, and Jardiance 10 mg daily.  Recheck [name] in 3 to 6 months.  He will continue lifestyle modifications.

## 2020-10-21 NOTE — Patient Instructions (Signed)
It was very nice to see you today!  Keep up the good work!  I am very happy with your numbers today.  I will see back in 6 months.  Please come in to see me sooner if needed.  Take care, Dr [name]  PLEASE NOTE:  If you had any lab tests please let us know if you have not heard back within a few days. You may see your results on mychart before we have a chance to review them but we will give you a call once they are reviewed by [place]. If we ordered any referrals today, please let us know if you have not heard from their office within the next week.   Please try these tips to maintain a healthy lifestyle:  Eat at least 3 REAL meals and 1-2 snacks per day.  Aim for no more than 5 hours between eating.  If you eat breakfast, please do so within one hour of getting up.   Each meal should contain half fruits/vegetables, one quarter protein, and one quarter carbs (no bigger than a computer mouse)  Cut down on sweet beverages. This includes juice, soda, and sweet tea.   Drink at least 1 glass of water with each meal and aim for at least 8 glasses per day  Exercise at least 150 minutes every week.

## 2020-10-21 NOTE — Progress Notes (Signed)
   Travis Owens is a 65 y.o. male who presents today for an office visit.  Assessment/Plan:  Chronic Problems Addressed Today: Type 2 diabetes mellitus with hyperglycemia, without long-term current use of insulin ([name]) [name] significantly improved to 7.2.  He has been working hard on lifestyle modifications.  We will continue his current medication regimen with semaglutide 2.4 mg weekly, metformin 1000 mg twice daily, and Jardiance 10 mg daily.  Recheck [name] in 3 to 6 months.  He will continue lifestyle modifications.    Hypertension associated with diabetes ([place]) At goal today on Entresto 24-26 twice daily and metoprolol 200 mg daily.  Continues to lose weight may be able to wean down on metoprolol.     Subjective:  HPI:  He states that he has been going to healthy weight management, and is exercising more to reduce his weight. Over the past 4 months, he has lost 20 lbs, down to 200 lbs. His current goal is reaching 180 lbs.   See A/p for status of chronic conditions.         Objective:  Physical Exam: BP 108/69   Pulse 73   Temp 97.6 F (36.4 C) (Temporal)   Ht 5\' 10"  (1.778 m)   Wt 208 lb (94.3 kg)   SpO2 97%   BMI 29.84 kg/m   Gen: No acute distress, resting comfortably CV: Regular rate and rhythm with no murmurs appreciated Pulm: Normal work of breathing, clear to auscultation bilaterally with no crackles, wheezes, or rhonchi Neuro: Grossly normal, moves all extremities Psych: Normal affect and thought content      I,Jordan Kelly,acting as a scribe for Dimas Chyle, MD.,have documented all relevant documentation on the behalf of Dimas Chyle, MD,as directed by  Dimas Chyle, MD while in the presence of Dimas Chyle, MD.   I, Dimas Chyle, MD, have reviewed all documentation for this visit. The documentation on 10/21/20 for the exam, diagnosis, procedures, and orders are all accurate and complete.    [name]. [name], MD 10/21/2020 10:25 AM

## 2020-10-21 NOTE — Assessment & Plan Note (Signed)
At goal today on Entresto 24-26 twice daily and metoprolol 200 mg daily.  Continues to lose weight may be able to wean down on metoprolol.

## 2020-10-22 NOTE — Progress Notes (Signed)
Chief Complaint:   OBESITY Travis Owens is here to discuss his progress with his obesity treatment plan along with follow-up of his obesity related diagnoses. Travis Owens is on the Category 4 Plan and states he is following his eating plan approximately 25% of the time. Travis Owens states he is doing yard work 3-4 hours 3 times per week.  Today's visit was #: 22 Starting weight: 220 lbs Starting date: 04/30/2019 Today's weight: 203 lbs Today's date: 10/20/2020 Total lbs lost to date: 17 Total lbs lost since last in-office visit: 8  Interim History: Travis Owens has had company for about 2 weeks, so he has had a more difficult time following the meal plan. He's been working in the yard more and is anxious to get back on meal plan. [place]. He doesn't foresee obstacles to getting back on plan.  Subjective:   1. Type 2 diabetes mellitus with hyperglycemia, without long-term current use of insulin ([name]) FBS 130-140's. Travis Owens has no feelings of hypoglycemia. He is seeing his PCP tomorrow. Pt's last [name] was 10.7.  2. Hypertension associated with diabetes ([place]) Travis Owens's BP is very well controlled. Pt just saw cardiologist. He is on Cozaar, Entresto, and Toprol.  BP Readings from Last 3 Encounters:  10/21/20 108/69  10/20/20 114/70  10/16/20 (!) 106/58   3. Polyphagia Travis Owens is on GLP-1 with good control. He denies side effects.  4. At risk for impaired metabolic function Travis Owens is at increased risk for impaired metabolic function due to current nutrition and muscle mass.  Assessment/Plan:   1. Type 2 diabetes mellitus with hyperglycemia, without long-term current use of insulin ([name]) Good blood sugar control is important to decrease the likelihood of diabetic complications such as nephropathy, neuropathy, limb loss, blindness, coronary artery disease, and death. Intensive lifestyle modification including diet, exercise and weight loss are the first line of treatment for  diabetes. Follow up with PCP tomorrow for labs.  2. Hypertension associated with diabetes ([place]) Travis Owens is working on healthy weight loss and exercise to improve blood pressure control. We will watch for signs of hypotension as he continues his lifestyle modifications. Follow up with Dr. Gardiner Rhyme.  3. Polyphagia Intensive lifestyle modifications are the first line treatment for this issue. We discussed several lifestyle modifications today and he will continue to work on diet, exercise and weight loss efforts. Orders and follow up as documented in patient record.  Counseling Polyphagia is excessive hunger. Causes can include: low blood sugars, hypERthyroidism, PMS, lack of sleep, stress, insulin resistance, diabetes, certain medications, and diets that are deficient in protein and fiber.   Refill- Semaglutide-Weight Management (WEGOVY) 2.4 MG/0.75ML SOAJ; Inject 2.4 mg into the skin once a week.  Dispense: 3 mL; Refill: 0  4. At risk for impaired metabolic function Travis Owens was given approximately 15 minutes of impaired  metabolic function prevention counseling today. We discussed intensive lifestyle modifications today with an emphasis on specific nutrition and exercise instructions and strategies.   Repetitive spaced learning was employed today to elicit superior memory formation and behavioral change.   5. Class 1 obesity with serious comorbidity and body mass index (BMI) of 32.0 to 32.9 in adult, unspecified obesity type  Travis Owens is currently in the action stage of change. As such, his goal is to continue with weight loss efforts. He has agreed to the Category 4 Plan.   Exercise goals:  As is  Behavioral modification strategies: increasing lean protein intake, meal planning  and cooking strategies, keeping healthy foods in the home, and planning for success.  Travis Owens has agreed to follow-up with our clinic in 3-4 weeks. He was informed of the importance of frequent follow-up visits to maximize his  success with intensive lifestyle modifications for his multiple health conditions.   Objective:   Blood pressure 114/70, pulse 75, temperature 98.1 F (36.7 C), height 5\' 10"  (1.778 m), weight 203 lb (92.1 kg), SpO2 97 %. Body mass index is 29.13 kg/m.  General: Cooperative, alert, well developed, in no acute distress. HEENT: Conjunctivae and lids unremarkable. Cardiovascular: Regular rhythm.  Lungs: Normal work of breathing. Neurologic: No focal deficits.   Lab Results  Component Value Date   CREATININE 0.75 (L) 10/16/2020   BUN 21 10/16/2020   NA 140 10/16/2020   K 4.3 10/16/2020   CL 102 10/16/2020   CO2 21 10/16/2020   Lab Results  Component Value Date   ALT 26 09/13/2019   AST 23 09/13/2019   ALKPHOS 60 09/13/2019   BILITOT 0.6 09/13/2019   Lab Results  Component Value Date   HGBA1C 7.2 (A) 10/21/2020   HGBA1C 10.7 (A) 06/18/2020   HGBA1C 10.6 (A) 03/18/2020   HGBA1C 7.5 (A) 09/13/2019   HGBA1C 9.1 (A) 06/21/2019   Lab Results  Component Value Date   INSULIN 24.2 04/30/2019   Lab Results  Component Value Date   TSH 1.33 09/13/2019   Lab Results  Component Value Date   CHOL 127 10/16/2020   HDL 48 10/16/2020   LDLCALC 57 10/16/2020   TRIG 127 10/16/2020   CHOLHDL 2.6 10/16/2020   Lab Results  Component Value Date   VD25OH 25.8 (L) 04/30/2019   Lab Results  Component Value Date   WBC 8.7 09/13/2019   HGB 14.2 09/13/2019   HCT 42.7 09/13/2019   MCV 90.2 09/13/2019   PLT 276.0 09/13/2019   No results found for: IRON, TIBC, FERRITIN   Attestation Statements:   Reviewed by clinician on day of visit: allergies, medications, problem list, medical history, surgical history, family history, social history, and previous encounter notes.  Coral Ceo, CMA, am acting as transcriptionist for Coralie Common, MD.  I have reviewed the above documentation for accuracy and completeness, and I agree with the above. - Jinny Blossom,  MD

## 2020-10-23 ENCOUNTER — Other Ambulatory Visit (HOSPITAL_COMMUNITY): Payer: Self-pay

## 2020-10-23 ENCOUNTER — Other Ambulatory Visit: Payer: Self-pay | Admitting: Family Medicine

## 2020-10-23 MED ORDER — FREESTYLE LITE TEST VI STRP
ORAL_STRIP | 12 refills | Status: AC
  Filled 2020-10-23: qty 100, 50d supply, fill #0

## 2020-10-23 MED ORDER — METFORMIN HCL 1000 MG PO TABS
1000.0000 mg | ORAL_TABLET | Freq: Two times a day (BID) | ORAL | 1 refills | Status: DC
  Filled 2020-10-23: qty 180, 90d supply, fill #0
  Filled 2021-02-03: qty 180, 90d supply, fill #1

## 2020-10-23 MED ORDER — ESCITALOPRAM OXALATE 20 MG PO TABS
20.0000 mg | ORAL_TABLET | Freq: Every day | ORAL | 1 refills | Status: DC
  Filled 2020-10-23: qty 90, 90d supply, fill #0
  Filled 2021-02-03: qty 90, 90d supply, fill #1

## 2020-10-23 MED FILL — Atorvastatin Calcium Tab 80 MG (Base Equivalent): ORAL | 90 days supply | Qty: 90 | Fill #0 | Status: AC

## 2020-10-23 MED FILL — Atorvastatin Calcium Tab 80 MG (Base Equivalent): ORAL | 90 days supply | Qty: 90 | Fill #0 | Status: CN

## 2020-10-27 ENCOUNTER — Encounter: Payer: Self-pay | Admitting: *Deleted

## 2020-10-27 NOTE — Progress Notes (Signed)
   Per D

## 2020-11-02 ENCOUNTER — Other Ambulatory Visit (HOSPITAL_COMMUNITY): Payer: Self-pay

## 2020-11-09 NOTE — Telephone Encounter (Signed)
Erroneous Encounter

## 2020-11-10 ENCOUNTER — Other Ambulatory Visit: Payer: Self-pay | Admitting: Family Medicine

## 2020-11-10 ENCOUNTER — Ambulatory Visit (INDEPENDENT_AMBULATORY_CARE_PROVIDER_SITE_OTHER): Payer: 59 | Admitting: Family Medicine

## 2020-11-10 ENCOUNTER — Other Ambulatory Visit (HOSPITAL_COMMUNITY): Payer: Self-pay

## 2020-11-10 DIAGNOSIS — G4733 Obstructive sleep apnea (adult) (pediatric): Secondary | ICD-10-CM | POA: Diagnosis not present

## 2020-11-10 MED ORDER — EZETIMIBE 10 MG PO TABS
10.0000 mg | ORAL_TABLET | Freq: Every day | ORAL | 3 refills | Status: DC
  Filled 2020-11-10: qty 90, 90d supply, fill #0
  Filled 2021-02-12: qty 90, 90d supply, fill #1
  Filled 2021-05-25: qty 90, 90d supply, fill #2

## 2020-11-16 ENCOUNTER — Encounter (INDEPENDENT_AMBULATORY_CARE_PROVIDER_SITE_OTHER): Payer: Self-pay

## 2020-11-17 ENCOUNTER — Other Ambulatory Visit: Payer: Self-pay

## 2020-11-17 ENCOUNTER — Ambulatory Visit (INDEPENDENT_AMBULATORY_CARE_PROVIDER_SITE_OTHER): Payer: 59 | Admitting: Family Medicine

## 2020-11-17 ENCOUNTER — Encounter (INDEPENDENT_AMBULATORY_CARE_PROVIDER_SITE_OTHER): Payer: Self-pay | Admitting: Family Medicine

## 2020-11-17 ENCOUNTER — Other Ambulatory Visit (HOSPITAL_COMMUNITY): Payer: Self-pay

## 2020-11-17 VITALS — BP 110/52 | HR 81 | Temp 98.2°F | Ht 68.0 in | Wt 203.0 lb

## 2020-11-17 DIAGNOSIS — E559 Vitamin D deficiency, unspecified: Secondary | ICD-10-CM

## 2020-11-17 DIAGNOSIS — E1165 Type 2 diabetes mellitus with hyperglycemia: Secondary | ICD-10-CM | POA: Diagnosis not present

## 2020-11-17 DIAGNOSIS — E669 Obesity, unspecified: Secondary | ICD-10-CM

## 2020-11-17 DIAGNOSIS — Z6831 Body mass index (BMI) 31.0-31.9, adult: Secondary | ICD-10-CM

## 2020-11-17 DIAGNOSIS — Z9189 Other specified personal risk factors, not elsewhere classified: Secondary | ICD-10-CM

## 2020-11-17 MED ORDER — VITAMIN D (ERGOCALCIFEROL) 1.25 MG (50000 UNIT) PO CAPS
50000.0000 [IU] | ORAL_CAPSULE | ORAL | 0 refills | Status: DC
  Filled 2020-11-17: qty 4, 28d supply, fill #0

## 2020-11-21 NOTE — Progress Notes (Signed)
Chief Complaint:   OBESITY Travis Owens is here to discuss his progress with his obesity treatment plan along with follow-up of his obesity related diagnoses. Travis Owens is on the Category 4 Plan and states he is following his eating plan approximately 25% of the time. Travis Owens states he is doing yard work.  Today's visit was #: 23 Starting weight: 220 lbs Starting date: 04/30/2019 Today's weight: 203 lbs Today's date: 11/17/2020 Total lbs lost to date: 17 Total lbs lost since last in-office visit: 0  Interim History: Travis Owens has been following the plan about 25% of the time. He has been going out to eat more frequently and wants to get back on plan a bit more. He wants to stock up on food on meal plan. He may even be able to go to the grocery store today or tomorrow. He has been eating Raisin Bran or Cheerios at breakfast instead of eggs.  Subjective:   1. Vitamin D deficiency Travis Owens denies nausea, vomiting, and muscle weakness but notes fatigue. He is on prescription Vit D.  2. Type 2 diabetes mellitus with hyperglycemia, without long-term current use of insulin ([name]) Travis Owens is on Metformin and GLP-1. He denies GI side effects. His recent [name] was 7.2.  3. At risk for osteoporosis Travis Owens is at higher risk of osteopenia and osteoporosis due to Vitamin D deficiency.   Assessment/Plan:   1. Vitamin D deficiency Low Vitamin D level contributes to fatigue and are associated with obesity, breast, and colon cancer. He agrees to continue to take prescription Vitamin D '@50'$ ,000 IU every week and will follow-up for routine testing of Vitamin D, at least 2-3 times per year to avoid over-replacement.  Refill- Vitamin D, Ergocalciferol, (DRISDOL) 1.25 MG (50000 UNIT) CAPS capsule; Take 1 capsule (50,000 Units total) by mouth every 7 (seven) days.  Dispense: 4 capsule; Refill: 0  2. Type 2 diabetes mellitus with hyperglycemia, without long-term current use of insulin ([name]) Good blood sugar control is important to  decrease the likelihood of diabetic complications such as nephropathy, neuropathy, limb loss, blindness, coronary artery disease, and death. Intensive lifestyle modification including diet, exercise and weight loss are the first line of treatment for diabetes. Continue current medication at current dosage with no change in therapy.  3. At risk for osteoporosis Travis Owens was given approximately 15 minutes of osteoporosis prevention counseling today. Travis Owens is at risk for osteopenia and osteoporosis due to his Vitamin D deficiency. He was encouraged to take his Vitamin D and follow his higher calcium diet and increase strengthening exercise to help strengthen his bones and decrease his risk of osteopenia and osteoporosis.  Repetitive spaced learning was employed today to elicit superior memory formation and behavioral change.  4. Obesity Current BMI 30  Travis Owens is currently in the action stage of change. As such, his goal is to continue with weight loss efforts. He has agreed to the Category 4 Plan with breakfast options.   Exercise goals:  As is  Behavioral modification strategies: increasing lean protein intake, meal planning and cooking strategies, keeping healthy foods in the home, and planning for success.  Travis Owens has agreed to follow-up with our clinic in 3 weeks. He was informed of the importance of frequent follow-up visits to maximize his success with intensive lifestyle modifications for his multiple health conditions.   Objective:   Blood pressure (!) 110/52, pulse 81, temperature 98.2 F (36.8 C), height '5\' 8"'$  (1.727 m), weight 203 lb (92.1 kg), SpO2 97 %. Body mass  index is 30.87 kg/m.  General: Cooperative, alert, well developed, in no acute distress. HEENT: Conjunctivae and lids unremarkable. Cardiovascular: Regular rhythm.  Lungs: Normal work of breathing. Neurologic: No focal deficits.   Lab Results  Component Value Date   CREATININE 0.75 (L) 10/16/2020   BUN 21 10/16/2020   NA  140 10/16/2020   K 4.3 10/16/2020   CL 102 10/16/2020   CO2 21 10/16/2020   Lab Results  Component Value Date   ALT 26 09/13/2019   AST 23 09/13/2019   ALKPHOS 60 09/13/2019   BILITOT 0.6 09/13/2019   Lab Results  Component Value Date   HGBA1C 7.2 (A) 10/21/2020   HGBA1C 10.7 (A) 06/18/2020   HGBA1C 10.6 (A) 03/18/2020   HGBA1C 7.5 (A) 09/13/2019   HGBA1C 9.1 (A) 06/21/2019   Lab Results  Component Value Date   INSULIN 24.2 04/30/2019   Lab Results  Component Value Date   TSH 1.33 09/13/2019   Lab Results  Component Value Date   CHOL 127 10/16/2020   HDL 48 10/16/2020   LDLCALC 57 10/16/2020   TRIG 127 10/16/2020   CHOLHDL 2.6 10/16/2020   Lab Results  Component Value Date   VD25OH 25.8 (L) 04/30/2019   Lab Results  Component Value Date   WBC 8.7 09/13/2019   HGB 14.2 09/13/2019   HCT 42.7 09/13/2019   MCV 90.2 09/13/2019   PLT 276.0 09/13/2019    Attestation Statements:   Reviewed by clinician on day of visit: allergies, medications, problem list, medical history, surgical history, family history, social history, and previous encounter notes.  Coral Ceo, CMA, am acting as transcriptionist for Coralie Common, MD.  I have reviewed the above documentation for accuracy and completeness, and I agree with the above. - Coralie Common, MD

## 2020-11-23 ENCOUNTER — Other Ambulatory Visit (HOSPITAL_COMMUNITY): Payer: Self-pay

## 2020-11-23 ENCOUNTER — Other Ambulatory Visit (INDEPENDENT_AMBULATORY_CARE_PROVIDER_SITE_OTHER): Payer: Self-pay | Admitting: Family Medicine

## 2020-11-23 ENCOUNTER — Encounter (INDEPENDENT_AMBULATORY_CARE_PROVIDER_SITE_OTHER): Payer: Self-pay

## 2020-11-23 DIAGNOSIS — R632 Polyphagia: Secondary | ICD-10-CM

## 2020-11-23 NOTE — Telephone Encounter (Signed)
Message sent to pt-CAS 

## 2020-11-23 NOTE — Telephone Encounter (Signed)
Dr.Ukleja 

## 2020-11-24 ENCOUNTER — Other Ambulatory Visit (INDEPENDENT_AMBULATORY_CARE_PROVIDER_SITE_OTHER): Payer: Self-pay | Admitting: Family Medicine

## 2020-11-24 ENCOUNTER — Other Ambulatory Visit (HOSPITAL_COMMUNITY): Payer: Self-pay

## 2020-11-24 DIAGNOSIS — R632 Polyphagia: Secondary | ICD-10-CM

## 2020-11-24 NOTE — Telephone Encounter (Signed)
Dr.Ukleja 

## 2020-11-24 NOTE — Telephone Encounter (Signed)
Message sent to pt 11/23/20

## 2020-11-24 NOTE — Telephone Encounter (Signed)
   Ok to refill?  LAST APPOINTMENT DATE: 10/20/20 NEXT APPOINTMENT DATE: 12/08/20   [facility] [address] [place] [address] Phone: [phone] Fax: [place] [address] Phone: [phone] Fax: [phone]  Patient is requesting a refill of the following medications: Pending Prescriptions:                       Disp   Refills   Semaglutide-Weight Management (WEGOVY) 2.4*3 mL   0       Sig: Inject 2.4 mg into the skin once a week.   Date last filled: 10/20/20 Previously prescribed by Dr Jearld Shines  Lab Results      Component                Value               Date                      HGBA1C                   7.2 (A)             10/21/2020                HGBA1C                   10.7 (A)            06/18/2020                HGBA1C                   10.6 (A)            03/18/2020           Lab Results      Component                Value               Date                      LDLCALC                  57                  10/16/2020                CREATININE               0.75 (L)            10/16/2020           Lab Results      Component                Value               Date                      VD25OH                   25.8 (L)            04/30/2019            BP Readings from Last 3 Encounters: 11/17/20 : (!) 110/52 10/21/20 : 108/69 10/20/20 : 114/70

## 2020-11-24 NOTE — Telephone Encounter (Signed)
Pt called in and stated that he is requesting a refil on Wegovy. I advised the pt I will send a message to the nurse. Please advise

## 2020-11-25 ENCOUNTER — Other Ambulatory Visit (HOSPITAL_COMMUNITY): Payer: Self-pay

## 2020-11-25 MED ORDER — WEGOVY 2.4 MG/0.75ML ~~LOC~~ SOAJ
2.4000 mg | SUBCUTANEOUS | 0 refills | Status: DC
  Filled 2020-11-25: qty 3, 28d supply, fill #0

## 2020-11-26 ENCOUNTER — Other Ambulatory Visit (HOSPITAL_COMMUNITY): Payer: Self-pay

## 2020-11-30 ENCOUNTER — Other Ambulatory Visit (HOSPITAL_COMMUNITY): Payer: Self-pay

## 2020-12-08 ENCOUNTER — Other Ambulatory Visit: Payer: Self-pay

## 2020-12-08 ENCOUNTER — Ambulatory Visit (INDEPENDENT_AMBULATORY_CARE_PROVIDER_SITE_OTHER): Payer: 59 | Admitting: Family Medicine

## 2020-12-08 ENCOUNTER — Encounter (INDEPENDENT_AMBULATORY_CARE_PROVIDER_SITE_OTHER): Payer: Self-pay | Admitting: Family Medicine

## 2020-12-08 ENCOUNTER — Other Ambulatory Visit (HOSPITAL_COMMUNITY): Payer: Self-pay

## 2020-12-08 VITALS — BP 124/62 | Temp 98.3°F | Ht 68.0 in | Wt 199.0 lb

## 2020-12-08 DIAGNOSIS — E559 Vitamin D deficiency, unspecified: Secondary | ICD-10-CM | POA: Diagnosis not present

## 2020-12-08 DIAGNOSIS — E669 Obesity, unspecified: Secondary | ICD-10-CM | POA: Diagnosis not present

## 2020-12-08 DIAGNOSIS — Z6833 Body mass index (BMI) 33.0-33.9, adult: Secondary | ICD-10-CM

## 2020-12-08 DIAGNOSIS — E1165 Type 2 diabetes mellitus with hyperglycemia: Secondary | ICD-10-CM

## 2020-12-08 MED ORDER — WEGOVY 2.4 MG/0.75ML ~~LOC~~ SOAJ
2.4000 mg | SUBCUTANEOUS | 0 refills | Status: DC
  Filled 2020-12-08 – 2020-12-24 (×2): qty 3, 28d supply, fill #0

## 2020-12-09 NOTE — Progress Notes (Signed)
Chief Complaint:   OBESITY Travis Owens is here to discuss his progress with his obesity treatment plan along with follow-up of his obesity related diagnoses. Quention is on the Category 4 Plan and states he is following his eating plan approximately 25% of the time. [name] states he is doing yard work 120 minutes 2-3 times per week.  Today's visit was #: 24 Starting weight: 220 lbs Starting date: 04/30/2019 Today's weight: 199 lbs Today's date: 12/08/2020 Total lbs lost to date: 21 Total lbs lost since last in-office visit: 4  Interim History: [name] had to put one of his pets down yesterday, which was very emotional. He has been doing quite a bit of yard work over the last few weeks. He feels like when he does physical labor, he eats more indulgently. He has no plans for the next few weeks.  Subjective:   1. Vitamin D deficiency Travis Owens is on prescription Vit D. His last Vit D level was 25.8.  2. Type 2 diabetes mellitus with hyperglycemia, without long-term current use of insulin ([place]) He is on Wegovy. Pt's last [name] was 7.2. He reports a decreased drive to eat sweets.  Assessment/Plan:   1. Vitamin D deficiency Low Vitamin D level contributes to fatigue and are associated with obesity, breast, and colon cancer. He agrees to discontinue prescription Vitamin D and draw lab at next appt. He will follow-up for routine testing of Vitamin D, at least 2-3 times per year to avoid over-replacement.  2. Type 2 diabetes mellitus with hyperglycemia, without long-term current use of insulin ([name]) Good blood sugar control is important to decrease the likelihood of diabetic complications such as nephropathy, neuropathy, limb loss, blindness, coronary artery disease, and death. Intensive lifestyle modification including diet, exercise and weight loss are the first line of treatment for diabetes. Continue [id number].  3. Obesity with current BMI of 30.4  Travis Owens is currently in the action stage of change. As such, his  goal is to continue with weight loss efforts. He has agreed to the Category 4 Plan.   Refill- Semaglutide-Weight Management (WEGOVY) 2.4 MG/0.75ML SOAJ; Inject 2.4 mg into the skin once a week.  Dispense: 3 mL; Refill: 0  Exercise goals:  As is  Behavioral modification strategies: increasing lean protein intake, meal planning and cooking strategies, and keeping healthy foods in the home.  Travis Owens has agreed to follow-up with our clinic in 3-4 weeks. He was informed of the importance of frequent follow-up visits to maximize his success with intensive lifestyle modifications for his multiple health conditions.   Objective:   Blood pressure 124/62, pulse (P) 91, temperature 98.3 F (36.8 C), height '5\' 8"'$  (1.727 m), weight 199 lb (90.3 kg), SpO2 (P) 96 %. Body mass index is 30.26 kg/m.  General: Cooperative, alert, well developed, in no acute distress. HEENT: Conjunctivae and lids unremarkable. Cardiovascular: Regular rhythm.  Lungs: Normal work of breathing. Neurologic: No focal deficits.   Lab Results  Component Value Date   CREATININE 0.75 (L) 10/16/2020   BUN 21 10/16/2020   NA 140 10/16/2020   K 4.3 10/16/2020   CL 102 10/16/2020   CO2 21 10/16/2020   Lab Results  Component Value Date   ALT 26 09/13/2019   AST 23 09/13/2019   ALKPHOS 60 09/13/2019   BILITOT 0.6 09/13/2019   Lab Results  Component Value Date   HGBA1C 7.2 (A) 10/21/2020   HGBA1C 10.7 (A) 06/18/2020   HGBA1C 10.6 (A) 03/18/2020   HGBA1C 7.5 (A)  09/13/2019   HGBA1C 9.1 (A) 06/21/2019   Lab Results  Component Value Date   INSULIN 24.2 04/30/2019   Lab Results  Component Value Date   TSH 1.33 09/13/2019   Lab Results  Component Value Date   CHOL 127 10/16/2020   HDL 48 10/16/2020   LDLCALC 57 10/16/2020   TRIG 127 10/16/2020   CHOLHDL 2.6 10/16/2020   Lab Results  Component Value Date   VD25OH 25.8 (L) 04/30/2019   Lab Results  Component Value Date   WBC 8.7 09/13/2019   HGB 14.2  09/13/2019   HCT 42.7 09/13/2019   MCV 90.2 09/13/2019   PLT 276.0 09/13/2019    Attestation Statements:   Reviewed by clinician on day of visit: allergies, medications, problem list, medical history, surgical history, family history, social history, and previous encounter notes.  Time spent on visit including pre-visit chart review and post-visit care and charting was 25 minutes.   Coral Ceo, CMA, am acting as transcriptionist for Coralie Common, MD.   I have reviewed the above documentation for accuracy and completeness, and I agree with the above. - Coralie Common, MD

## 2020-12-11 DIAGNOSIS — G4733 Obstructive sleep apnea (adult) (pediatric): Secondary | ICD-10-CM | POA: Diagnosis not present

## 2020-12-14 ENCOUNTER — Telehealth: Payer: Self-pay | Admitting: Cardiovascular Disease

## 2020-12-14 NOTE — Telephone Encounter (Signed)
New Message:     Please call, Patient thinks he needs his machine reset

## 2020-12-15 NOTE — Telephone Encounter (Signed)
What problem are you experiencing? Pressure went down due to it being unplugged for a few days, needs signal reset  Who is your medical equipment company? [employer]    Please route to the sleep study assistant.

## 2020-12-16 NOTE — Telephone Encounter (Signed)
Returned a call to the patient. He was informed that the CPAP machine pressures will not be affected by being unplugged. Patient also informed that [name]  shows that he has not used it since  August 12th. He only used it then for 2 hours and 24 minutes, and 5 days out of the last 30. It does not show that it has been unplugged. The patient states that the last use date is probably correct. " I'm going to start back to using it." He thanked me for returning his call. I answered his question about the pressure settings. Call ended.

## 2020-12-24 ENCOUNTER — Other Ambulatory Visit (HOSPITAL_COMMUNITY): Payer: Self-pay

## 2021-01-11 ENCOUNTER — Other Ambulatory Visit: Payer: Self-pay

## 2021-01-11 ENCOUNTER — Ambulatory Visit (INDEPENDENT_AMBULATORY_CARE_PROVIDER_SITE_OTHER): Payer: 59 | Admitting: Family Medicine

## 2021-01-11 ENCOUNTER — Encounter (INDEPENDENT_AMBULATORY_CARE_PROVIDER_SITE_OTHER): Payer: Self-pay | Admitting: Family Medicine

## 2021-01-11 ENCOUNTER — Other Ambulatory Visit (HOSPITAL_COMMUNITY): Payer: Self-pay

## 2021-01-11 VITALS — BP 123/73 | HR 71 | Temp 97.4°F | Ht 68.0 in | Wt 199.0 lb

## 2021-01-11 DIAGNOSIS — E1165 Type 2 diabetes mellitus with hyperglycemia: Secondary | ICD-10-CM | POA: Diagnosis not present

## 2021-01-11 DIAGNOSIS — Z6833 Body mass index (BMI) 33.0-33.9, adult: Secondary | ICD-10-CM | POA: Diagnosis not present

## 2021-01-11 DIAGNOSIS — E559 Vitamin D deficiency, unspecified: Secondary | ICD-10-CM

## 2021-01-11 DIAGNOSIS — E1169 Type 2 diabetes mellitus with other specified complication: Secondary | ICD-10-CM

## 2021-01-11 DIAGNOSIS — E669 Obesity, unspecified: Secondary | ICD-10-CM

## 2021-01-11 DIAGNOSIS — E785 Hyperlipidemia, unspecified: Secondary | ICD-10-CM | POA: Diagnosis not present

## 2021-01-11 DIAGNOSIS — Z9189 Other specified personal risk factors, not elsewhere classified: Secondary | ICD-10-CM

## 2021-01-11 MED ORDER — WEGOVY 2.4 MG/0.75ML ~~LOC~~ SOAJ
2.4000 mg | SUBCUTANEOUS | 0 refills | Status: DC
  Filled 2021-01-11 – 2021-01-26 (×6): qty 3, 28d supply, fill #0

## 2021-01-11 NOTE — Progress Notes (Signed)
Chief Complaint:   OBESITY Travis Owens is here to discuss his progress with his obesity treatment plan along with follow-up of his obesity related diagnoses. Travis Owens is on the Category 4 Plan and states he is following his eating plan approximately 25% of the time. Travis Owens states he is walking and working in the yard 30 minutes 4 times per week.  Today's visit was #: 25 Starting weight: 220 lbs Starting date: 04/30/2019 Today's weight: 199 lbs Today's date: 01/11/2021 Total lbs lost to date: 21 Total lbs lost since last in-office visit: 0  Interim History: Travis Owens has been working in the yard the last few weeks. He is surprised he maintained weight- thought he had gained. He has a few celebratory events and has eaten out a bit. Pt thinks there may be a 30 year surprise party at is church in October. He wants to be a bit more in control with food choices in the next few weeks.  Subjective:   1. Hyperlipidemia associated with type 2 diabetes mellitus (Travis Owens) Travis Owens is on Lipitor with good control of LDL and denies transaminitis.  2. Vitamin D deficiency He was previously on Vit D and reports fatigue.  3. Type 2 diabetes mellitus with hyperglycemia, without long-term current use of insulin ([name]) Pt is on GLP-1 and Metformin.  4. At risk for osteoporosis Travis Owens is at higher risk of osteopenia and osteoporosis due to Vitamin D deficiency.   Assessment/Plan:   1. Hyperlipidemia associated with type 2 diabetes mellitus ([place]) Cardiovascular risk and specific lipid/LDL goals reviewed.  We discussed several lifestyle modifications today and Travis Owens will continue to work on diet, exercise and weight loss efforts. Orders and follow up as documented in patient record.   Counseling Intensive lifestyle modifications are the first line treatment for this issue. Dietary changes: Increase soluble fiber. Decrease simple carbohydrates. Exercise changes: Moderate to vigorous-intensity aerobic activity 150 minutes per  week if tolerated. Lipid-lowering medications: see documented in medical record. Check labs today.  - Comprehensive metabolic panel - Lipid Panel With LDL/HDL Ratio  2. Vitamin D deficiency Low Vitamin D level contributes to fatigue and are associated with obesity, breast, and colon cancer. He will follow-up for routine testing of Vitamin D, at least 2-3 times per year to avoid over-replacement. Check labs today.  - VITAMIN D 25 Hydroxy (Vit-D Deficiency, Fractures)  3. Type 2 diabetes mellitus with hyperglycemia, without long-term current use of insulin ([name]) Good blood sugar control is important to decrease the likelihood of diabetic complications such as nephropathy, neuropathy, limb loss, blindness, coronary artery disease, and death. Intensive lifestyle modification including diet, exercise and weight loss are the first line of treatment for diabetes.  Check labs today.  - Insulin, random  4. At risk for osteoporosis Travis Owens was given approximately 15 minutes of osteoporosis prevention counseling today. Travis Owens is at risk for osteopenia and osteoporosis due to his Vitamin D deficiency. He was encouraged to take his Vitamin D and follow his higher calcium diet and increase strengthening exercise to help strengthen his bones and decrease his risk of osteopenia and osteoporosis.  Repetitive spaced learning was employed today to elicit superior memory formation and behavioral change.  5. Obesity with current BMI of 30.3  Travis Owens is currently in the action stage of change. As such, his goal is to continue with weight loss efforts. He has agreed to the Category 4 Plan.   We discussed various medication options to help Travis Owens with his weight loss efforts and we  both agreed to continue Wegovy 2.4 mg. - Semaglutide-Weight Management (WEGOVY) 2.4 MG/0.75ML SOAJ; Inject 2.4 mg into the skin once a week.  Dispense: 3 mL; Refill: 0  Exercise goals:  Pt is to use total gym for resistance training 2-3 times  a week.  Behavioral modification strategies: increasing lean protein intake, decreasing eating out, and celebration eating strategies.  Travis Owens has agreed to follow-up with our clinic in 3 weeks. He was informed of the importance of frequent follow-up visits to maximize his success with intensive lifestyle modifications for his multiple health conditions.   Travis Owens was informed we would discuss his lab results at his next visit unless there is a critical issue that needs to be addressed sooner. Travis Owens agreed to keep his next visit at the agreed upon time to discuss these results.  Objective:   Blood pressure 123/73, pulse 71, temperature (!) 97.4 F (36.3 C), height 5\' 8"  (1.727 m), weight 199 lb (90.3 kg), SpO2 97 %. Body mass index is 30.26 kg/m.  General: Cooperative, alert, well developed, in no acute distress. HEENT: Conjunctivae and lids unremarkable. Cardiovascular: Regular rhythm.  Lungs: Normal work of breathing. Neurologic: No focal deficits.   Lab Results  Component Value Date   CREATININE 0.75 (L) 10/16/2020   BUN 21 10/16/2020   NA 140 10/16/2020   K 4.3 10/16/2020   CL 102 10/16/2020   CO2 21 10/16/2020   Lab Results  Component Value Date   ALT 26 09/13/2019   AST 23 09/13/2019   ALKPHOS 60 09/13/2019   BILITOT 0.6 09/13/2019   Lab Results  Component Value Date   HGBA1C 7.2 (A) 10/21/2020   HGBA1C 10.7 (A) 06/18/2020   HGBA1C 10.6 (A) 03/18/2020   HGBA1C 7.5 (A) 09/13/2019   HGBA1C 9.1 (A) 06/21/2019   Lab Results  Component Value Date   INSULIN 24.2 04/30/2019   Lab Results  Component Value Date   TSH 1.33 09/13/2019   Lab Results  Component Value Date   CHOL 127 10/16/2020   HDL 48 10/16/2020   LDLCALC 57 10/16/2020   TRIG 127 10/16/2020   CHOLHDL 2.6 10/16/2020   Lab Results  Component Value Date   VD25OH 25.8 (L) 04/30/2019   Lab Results  Component Value Date   WBC 8.7 09/13/2019   HGB 14.2 09/13/2019   HCT 42.7 09/13/2019   MCV 90.2  09/13/2019   PLT 276.0 09/13/2019    Attestation Statements:   Reviewed by clinician on day of visit: allergies, medications, problem list, medical history, surgical history, family history, social history, and previous encounter notes.  Coral Ceo, CMA, am acting as transcriptionist for Coralie Common, MD.   I have reviewed the above documentation for accuracy and completeness, and I agree with the above. - Coralie Common, MD

## 2021-01-12 LAB — LIPID PANEL WITH LDL/HDL RATIO
Cholesterol, Total: 146 mg/dL (ref 100–199)
HDL: 54 mg/dL (ref >39)
LDL Chol Calc (NIH): 75 mg/dL (ref 0–99)
LDL/HDL Ratio: 1.4 ratio (ref 0.0–3.6)
Triglycerides: 90 mg/dL (ref 0–149)
VLDL Cholesterol Cal: 17 mg/dL (ref 5–40)

## 2021-01-12 LAB — COMPREHENSIVE METABOLIC PANEL
ALT: 31 IU/L (ref 0–44)
AST: 28 IU/L (ref 0–40)
Albumin/Globulin Ratio: 2.1 (ref 1.2–2.2)
Albumin: 4.6 g/dL (ref 3.8–4.8)
Alkaline Phosphatase: 57 IU/L (ref 44–121)
BUN/Creatinine Ratio: 17 (ref 10–24)
BUN: 13 mg/dL (ref 8–27)
Bilirubin Total: <0.2 mg/dL (ref 0.0–1.2)
CO2: 25 mmol/L (ref 20–29)
Calcium: 9.5 mg/dL (ref 8.6–10.2)
Chloride: 102 mmol/L (ref 96–106)
Creatinine, Ser: 0.76 mg/dL (ref 0.76–1.27)
Globulin, Total: 2.2 g/dL (ref 1.5–4.5)
Glucose: 137 mg/dL — ABNORMAL HIGH (ref 65–99)
Potassium: 4.8 mmol/L (ref 3.5–5.2)
Sodium: 142 mmol/L (ref 134–144)
Total Protein: 6.8 g/dL (ref 6.0–8.5)
eGFR: 100 mL/min/{1.73_m2} (ref >59)

## 2021-01-12 LAB — VITAMIN D 25 HYDROXY (VIT D DEFICIENCY, FRACTURES): Vit D, 25-Hydroxy: 84.2 ng/mL (ref 30.0–100.0)

## 2021-01-12 LAB — INSULIN, RANDOM: INSULIN: 36 u[IU]/mL — ABNORMAL HIGH (ref 2.6–24.9)

## 2021-01-14 ENCOUNTER — Other Ambulatory Visit (HOSPITAL_COMMUNITY): Payer: Self-pay

## 2021-01-15 ENCOUNTER — Other Ambulatory Visit (HOSPITAL_COMMUNITY): Payer: Self-pay

## 2021-01-18 ENCOUNTER — Other Ambulatory Visit (HOSPITAL_COMMUNITY): Payer: Self-pay

## 2021-01-19 ENCOUNTER — Other Ambulatory Visit (HOSPITAL_COMMUNITY): Payer: Self-pay

## 2021-01-26 ENCOUNTER — Other Ambulatory Visit (HOSPITAL_COMMUNITY): Payer: Self-pay

## 2021-01-26 ENCOUNTER — Telehealth (INDEPENDENT_AMBULATORY_CARE_PROVIDER_SITE_OTHER): Payer: Self-pay

## 2021-01-26 NOTE — Telephone Encounter (Signed)
Pt wife called in and stated that she would like a call back. The wife stated that she doesn't use mychart. She is calling in to check the status of the PA for [facility]. Please advise

## 2021-01-26 NOTE — Telephone Encounter (Signed)
Can you check on this PA? Thanks

## 2021-01-26 NOTE — Telephone Encounter (Signed)
I return patient wife call and left message on voicemail that Mancel Parsons has been approved and to contact pharmacy to have it fill.

## 2021-01-27 ENCOUNTER — Other Ambulatory Visit (HOSPITAL_COMMUNITY): Payer: Self-pay

## 2021-01-28 ENCOUNTER — Ambulatory Visit (INDEPENDENT_AMBULATORY_CARE_PROVIDER_SITE_OTHER): Payer: 59 | Admitting: Family Medicine

## 2021-02-03 ENCOUNTER — Other Ambulatory Visit (HOSPITAL_COMMUNITY): Payer: Self-pay

## 2021-02-03 MED FILL — Atorvastatin Calcium Tab 80 MG (Base Equivalent): ORAL | 90 days supply | Qty: 90 | Fill #1 | Status: AC

## 2021-02-12 ENCOUNTER — Other Ambulatory Visit (HOSPITAL_COMMUNITY): Payer: Self-pay

## 2021-02-15 ENCOUNTER — Other Ambulatory Visit: Payer: Self-pay

## 2021-02-15 ENCOUNTER — Encounter (INDEPENDENT_AMBULATORY_CARE_PROVIDER_SITE_OTHER): Payer: Self-pay | Admitting: Family Medicine

## 2021-02-15 ENCOUNTER — Other Ambulatory Visit (HOSPITAL_COMMUNITY): Payer: Self-pay

## 2021-02-15 ENCOUNTER — Ambulatory Visit (INDEPENDENT_AMBULATORY_CARE_PROVIDER_SITE_OTHER): Payer: 59 | Admitting: Family Medicine

## 2021-02-15 VITALS — BP 117/66 | HR 72 | Temp 97.8°F | Ht 68.0 in | Wt 196.0 lb

## 2021-02-15 DIAGNOSIS — E1165 Type 2 diabetes mellitus with hyperglycemia: Secondary | ICD-10-CM | POA: Diagnosis not present

## 2021-02-15 DIAGNOSIS — Z6833 Body mass index (BMI) 33.0-33.9, adult: Secondary | ICD-10-CM | POA: Diagnosis not present

## 2021-02-15 DIAGNOSIS — E559 Vitamin D deficiency, unspecified: Secondary | ICD-10-CM | POA: Diagnosis not present

## 2021-02-15 DIAGNOSIS — E669 Obesity, unspecified: Secondary | ICD-10-CM | POA: Diagnosis not present

## 2021-02-15 MED ORDER — WEGOVY 2.4 MG/0.75ML ~~LOC~~ SOAJ
2.4000 mg | SUBCUTANEOUS | 0 refills | Status: DC
Start: 2021-02-15 — End: 2021-03-08
  Filled 2021-02-15 – 2021-02-25 (×2): qty 3, 28d supply, fill #0

## 2021-02-16 ENCOUNTER — Other Ambulatory Visit (HOSPITAL_COMMUNITY): Payer: Self-pay
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
## 2021-02-17 ENCOUNTER — Other Ambulatory Visit (HOSPITAL_COMMUNITY): Payer: Self-pay

## 2021-02-24 ENCOUNTER — Other Ambulatory Visit (HOSPITAL_COMMUNITY): Payer: Self-pay

## 2021-02-25 ENCOUNTER — Other Ambulatory Visit (HOSPITAL_COMMUNITY): Payer: Self-pay

## 2021-03-08 ENCOUNTER — Other Ambulatory Visit (HOSPITAL_COMMUNITY): Payer: Self-pay

## 2021-03-08 ENCOUNTER — Encounter (INDEPENDENT_AMBULATORY_CARE_PROVIDER_SITE_OTHER): Payer: Self-pay | Admitting: Family Medicine

## 2021-03-08 ENCOUNTER — Other Ambulatory Visit: Payer: Self-pay

## 2021-03-08 ENCOUNTER — Other Ambulatory Visit: Payer: Self-pay | Admitting: Cardiology

## 2021-03-08 ENCOUNTER — Ambulatory Visit (INDEPENDENT_AMBULATORY_CARE_PROVIDER_SITE_OTHER): Payer: 59 | Admitting: Family Medicine

## 2021-03-08 VITALS — BP 92/52 | HR 79 | Temp 97.8°F | Ht 68.0 in | Wt 192.0 lb

## 2021-03-08 DIAGNOSIS — Z6833 Body mass index (BMI) 33.0-33.9, adult: Secondary | ICD-10-CM | POA: Diagnosis not present

## 2021-03-08 DIAGNOSIS — E669 Obesity, unspecified: Secondary | ICD-10-CM | POA: Diagnosis not present

## 2021-03-08 DIAGNOSIS — I2510 Atherosclerotic heart disease of native coronary artery without angina pectoris: Secondary | ICD-10-CM

## 2021-03-08 DIAGNOSIS — I10 Essential (primary) hypertension: Secondary | ICD-10-CM | POA: Diagnosis not present

## 2021-03-08 DIAGNOSIS — E559 Vitamin D deficiency, unspecified: Secondary | ICD-10-CM | POA: Diagnosis not present

## 2021-03-08 MED ORDER — WEGOVY 2.4 MG/0.75ML ~~LOC~~ SOAJ
2.4000 mg | SUBCUTANEOUS | 0 refills | Status: DC
  Filled 2021-03-08 – 2021-03-24 (×2): qty 3, 28d supply, fill #0

## 2021-03-09 ENCOUNTER — Other Ambulatory Visit (HOSPITAL_COMMUNITY): Payer: Self-pay

## 2021-03-09 ENCOUNTER — Telehealth: Payer: Self-pay | Admitting: Cardiology

## 2021-03-09 DIAGNOSIS — I10 Essential (primary) hypertension: Secondary | ICD-10-CM

## 2021-03-09 DIAGNOSIS — Z5181 Encounter for therapeutic drug level monitoring: Secondary | ICD-10-CM

## 2021-03-09 MED ORDER — EMPAGLIFLOZIN 10 MG PO TABS
10.0000 mg | ORAL_TABLET | Freq: Every day | ORAL | 5 refills | Status: DC
  Filled 2021-03-09: qty 30, 30d supply, fill #0
  Filled 2021-04-04: qty 30, 30d supply, fill #1
  Filled 2021-05-04: qty 30, 30d supply, fill #2
  Filled 2021-06-15: qty 30, 30d supply, fill #3
  Filled 2021-07-23: qty 30, 30d supply, fill #4
  Filled 2021-08-24: qty 30, 30d supply, fill #5

## 2021-03-09 MED ORDER — ENTRESTO 24-26 MG PO TABS
1.0000 | ORAL_TABLET | Freq: Two times a day (BID) | ORAL | 1 refills | Status: DC
  Filled 2021-03-09: qty 180, 90d supply, fill #0

## 2021-03-09 NOTE — Telephone Encounter (Signed)
Pt c/o BP issue: STAT if pt c/o blurred vision, one-sided weakness or slurred speech  1. What are your last 5 BP readings?  03/08/21 2:30 PM at healthy weight management 92/52  2. Are you having any other symptoms (ex. Dizziness, headache, blurred vision, passed out)? Dizziness when standing up too fast   3. What is your BP issue? Hypotension at healthy weight management appt.     Garfield also had an episode of a small fall yesterday prior to going into the weight management appt. He believes is was due to the dizziness. He said he had his hands on the counter top and turned around ending up sliding down onto the floor with his back up against the counter. He did not hurt himself, so he didn't make this known at the appt yesterday. Due to his BP being low Dr. Jeani Sow advised he call and possibly discuss lowering his entresto dosage. If a decrease is made the pharmacy will need to be contacted to have this updated due to a new prescription being sent into them yesterday that he has not picked up yet.

## 2021-03-09 NOTE — Progress Notes (Signed)
Chief Complaint:   OBESITY Travis Owens is here to discuss his progress with his obesity treatment plan along with follow-up of his obesity related diagnoses. Travis Owens is on the Category 4 Plan and states he is following his eating plan approximately 33% of the time. Travis Owens states he is doing yard work 45 minutes 2 times per week.  Today's visit was #: 43 Starting weight: 220 lbs Starting date: 04/30/2019 Today's weight: 192 lbs Today's date: 03/08/2021 Total lbs lost to date: 28 Total lbs lost since last in-office visit: 4  Interim History: Travis Owens has been working in the lawn, mulching the leaves. He has been trying to focus on protein throughout the day. [place] and is planning a nice meal with family and friends. After Thanksgiving, pt has no plans throughout Christmas season.  Subjective:   1. Essential hypertension BP low today. BP previously within normal limits. Pt is experiencing lightheadedness with change in position.  2. Vitamin D deficiency Travis Owens stopped taking all Vit D supplements about 3 weeks ago. His Vit D level is 84.2. Pt denies nausea, vomiting, and muscle weakness.  Assessment/Plan:   1. Essential hypertension Travis Owens is working on healthy weight loss and exercise to improve blood pressure control. We will watch for signs of hypotension as he continues his lifestyle modifications. Pt is to reach out to Dr. Gardiner Rhyme for further management (he has an appt Dec. 23 with cardiology).  2. Vitamin D deficiency Discontinue all Vit D supplementation. Recheck lab in 4 months.  3. Obesity with current BMI of 30.3  Travis Owens is currently in the action stage of change. As such, his goal is to continue with weight loss efforts. He has agreed to the Category 4 Plan.   We discussed various medication options to help Travis Owens with his weight loss efforts and we both agreed to continue Wegovy 2.4 mg. Refill- Semaglutide-Weight Management (WEGOVY) 2.4 MG/0.75ML  SOAJ; Inject 2.4 mg into the skin once a week.  Dispense: 3 mL; Refill: 0  Exercise goals: All adults should avoid inactivity. Some physical activity is better than none, and adults who participate in any amount of physical activity gain some health benefits.  Behavioral modification strategies: increasing lean protein intake, meal planning and cooking strategies, keeping healthy foods in the home, travel eating strategies, and holiday eating strategies .  Travis Owens has agreed to follow-up with our clinic in 4 weeks. He was informed of the importance of frequent follow-up visits to maximize his success with intensive lifestyle modifications for his multiple health conditions.   Objective:   Blood pressure (!) 92/52, pulse 79, temperature 97.8 F (36.6 C), height 5\' 8"  (1.727 m), weight 192 lb (87.1 kg), SpO2 97 %. Body mass index is 29.19 kg/m.  General: Cooperative, alert, well developed, in no acute distress. HEENT: Conjunctivae and lids unremarkable. Cardiovascular: Regular rhythm.  Lungs: Normal work of breathing. Neurologic: No focal deficits.   Lab Results  Component Value Date   CREATININE 0.76 01/11/2021   BUN 13 01/11/2021   NA 142 01/11/2021   K 4.8 01/11/2021   CL 102 01/11/2021   CO2 25 01/11/2021   Lab Results  Component Value Date   ALT 31 01/11/2021   AST 28 01/11/2021   ALKPHOS 57 01/11/2021   BILITOT <0.2 01/11/2021   Lab Results  Component Value Date   HGBA1C 7.2 (A) 10/21/2020   HGBA1C 10.7 (A) 06/18/2020   HGBA1C 10.6 (A) 03/18/2020   HGBA1C 7.5 (  A) 09/13/2019   HGBA1C 9.1 (A) 06/21/2019   Lab Results  Component Value Date   INSULIN 36.0 (H) 01/11/2021   INSULIN 24.2 04/30/2019   Lab Results  Component Value Date   TSH 1.33 09/13/2019   Lab Results  Component Value Date   CHOL 146 01/11/2021   HDL 54 01/11/2021   LDLCALC 75 01/11/2021   TRIG 90 01/11/2021   CHOLHDL 2.6 10/16/2020   Lab Results  Component Value Date   VD25OH 84.2  01/11/2021   VD25OH 25.8 (L) 04/30/2019   Lab Results  Component Value Date   WBC 8.7 09/13/2019   HGB 14.2 09/13/2019   HCT 42.7 09/13/2019   MCV 90.2 09/13/2019   PLT 276.0 09/13/2019    Attestation Statements:   Reviewed by clinician on day of visit: allergies, medications, problem list, medical history, surgical history, family history, social history, and previous encounter notes.  Coral Ceo, CMA, am acting as transcriptionist for Coralie Common, MD.   I have reviewed the above documentation for accuracy and completeness, and I agree with the above. - Coralie Common, MD

## 2021-03-09 NOTE — Telephone Encounter (Signed)
Spoke with patient of Dr. Gardiner Rhyme who reports low BP and dizziness. He does not check BP routinely at home. BP at weight management appt was 92/52 on 11/14. He reports dizziness mostly with position changes, like he would pass out -- but has NOT passed out yet. He has to steady himself when he changes position/gets dizzy.   He has a home BP cuff and wife is a [employer] scientist - so has means to check if needed.   He does not report issues with low blood sugar  Verified all meds are up to date  Offered sooner visit with MD on 11/22 @ 4pm but he declined, would rather have MD opinion first  Will route to Dr. Lorelee Cover RN to review

## 2021-03-09 NOTE — Telephone Encounter (Signed)
Left message to call back  

## 2021-03-10 ENCOUNTER — Other Ambulatory Visit (HOSPITAL_COMMUNITY): Payer: Self-pay

## 2021-03-11 NOTE — Telephone Encounter (Signed)
He is on the lowest dose of Entresto, would recommend we stop Entresto and start losartan 25 mg daily.  Would ask him to check BP and HR daily for next week and call with results.  Check BMET in 1 week

## 2021-03-12 ENCOUNTER — Other Ambulatory Visit (HOSPITAL_COMMUNITY): Payer: Self-pay

## 2021-03-12 MED ORDER — LOSARTAN POTASSIUM 25 MG PO TABS
25.0000 mg | ORAL_TABLET | Freq: Every day | ORAL | 3 refills | Status: DC
  Filled 2021-03-12: qty 90, 90d supply, fill #0
  Filled 2021-06-15: qty 90, 90d supply, fill #1

## 2021-03-12 NOTE — Telephone Encounter (Signed)
Patient returned call

## 2021-03-12 NOTE — Telephone Encounter (Signed)
Left message to call back  

## 2021-03-12 NOTE — Telephone Encounter (Signed)
Advised patient, verbalized understanding  

## 2021-03-24 ENCOUNTER — Other Ambulatory Visit (HOSPITAL_COMMUNITY): Payer: Self-pay

## 2021-04-02 DIAGNOSIS — G4733 Obstructive sleep apnea (adult) (pediatric): Secondary | ICD-10-CM | POA: Diagnosis not present

## 2021-04-05 ENCOUNTER — Encounter (INDEPENDENT_AMBULATORY_CARE_PROVIDER_SITE_OTHER): Payer: Self-pay | Admitting: Family Medicine

## 2021-04-05 ENCOUNTER — Ambulatory Visit (INDEPENDENT_AMBULATORY_CARE_PROVIDER_SITE_OTHER): Payer: Medicare Other | Admitting: Family Medicine

## 2021-04-05 ENCOUNTER — Other Ambulatory Visit (HOSPITAL_COMMUNITY): Payer: Self-pay

## 2021-04-05 ENCOUNTER — Other Ambulatory Visit: Payer: Self-pay

## 2021-04-05 VITALS — BP 102/61 | HR 76 | Temp 98.1°F | Ht 68.0 in | Wt 195.0 lb

## 2021-04-05 DIAGNOSIS — E785 Hyperlipidemia, unspecified: Secondary | ICD-10-CM | POA: Diagnosis not present

## 2021-04-05 DIAGNOSIS — Z6833 Body mass index (BMI) 33.0-33.9, adult: Secondary | ICD-10-CM

## 2021-04-05 DIAGNOSIS — E1169 Type 2 diabetes mellitus with other specified complication: Secondary | ICD-10-CM | POA: Diagnosis not present

## 2021-04-05 DIAGNOSIS — E1165 Type 2 diabetes mellitus with hyperglycemia: Secondary | ICD-10-CM

## 2021-04-05 DIAGNOSIS — E669 Obesity, unspecified: Secondary | ICD-10-CM | POA: Diagnosis not present

## 2021-04-05 MED ORDER — WEGOVY 2.4 MG/0.75ML ~~LOC~~ SOAJ
2.4000 mg | SUBCUTANEOUS | 0 refills | Status: DC
  Filled 2021-04-05 – 2021-04-15 (×3): qty 3, 28d supply, fill #0

## 2021-04-05 NOTE — Progress Notes (Signed)
Chief Complaint:   OBESITY Travis Owens is here to discuss his progress with his obesity treatment plan along with follow-up of his obesity related diagnoses. Travis Owens is on the Category 4 Plan and states he is following his eating plan approximately 25% of the time. Travis Owens states he is doing yard work 20 minutes 1 times per week.  Today's visit was #: 28 Starting weight: 220 lbs Starting date: 04/30/2019 Today's weight: 195 lbs Today's date: 04/05/2021 Total lbs lost to date: 25 Total lbs lost since last in-office visit: 0  Interim History: Travis Owens had an enjoyable few weeks and a good Thanksgiving. The next few weeks, pt is celebrating Christmas and planning on trying to get back to walking. He has all food to get back on meal plan. Pt voices his biggest obstacle in the next few weeks will be increased indulgent foods around.  Subjective:   1. Hyperlipidemia associated with type 2 diabetes mellitus (Travis Owens) Pt has an LDL of 75, HDL 54, and triglycerides 90. He is on Lipitor and Zetia.  2. Type 2 diabetes mellitus with hyperglycemia, without long-term current use of insulin ([name]) Travis Owens is on metformin and GLP-1. His last [name] was 7.2. He is seeing his PCP at the end of this month.  Assessment/Plan:   1. Hyperlipidemia associated with type 2 diabetes mellitus (Travis Owens) Not at goal. Cardiovascular risk and specific lipid/LDL goals reviewed.  We discussed several lifestyle modifications today and Travis Owens will continue to work on diet, exercise and weight loss efforts. Orders and follow up as documented in patient record. Continue current treatment plan.  Counseling Intensive lifestyle modifications are the first line treatment for this issue. Dietary changes: Increase soluble fiber. Decrease simple carbohydrates. Exercise changes: Moderate to vigorous-intensity aerobic activity 150 minutes per week if tolerated. Lipid-lowering medications: see documented in medical record.  2. Type 2 diabetes mellitus with  hyperglycemia, without long-term current use of insulin ([name]) Good blood sugar control is important to decrease the likelihood of diabetic complications such as nephropathy, neuropathy, limb loss, blindness, coronary artery disease, and death. Intensive lifestyle modification including diet, exercise and weight loss are the first line of treatment for diabetes. Labs per PCP.  3. Obesity with current BMI of 29.7  Travis Owens is currently in the action stage of change. As such, his goal is to continue with weight loss efforts. He has agreed to the Category 4 Plan.   We discussed various medication options to help Travis Owens with his weight loss efforts and we both agreed to continue Wegovy 2.4 mg as prescribed. Refill- Semaglutide-Weight Management (WEGOVY) 2.4 MG/0.75ML SOAJ; Inject 2.4 mg into the skin once a week.  Dispense: 3 mL; Refill: 0  Exercise goals: All adults should avoid inactivity. Some physical activity is better than none, and adults who participate in any amount of physical activity gain some health benefits. Walk for at least 15 minutes 3-4 times a week.  Behavioral modification strategies: increasing lean protein intake and meal planning and cooking strategies.  Travis Owens has agreed to follow-up with our clinic in 4 weeks. He was informed of the importance of frequent follow-up visits to maximize his success with intensive lifestyle modifications for his multiple health conditions.   Objective:   Blood pressure 102/61, pulse 76, temperature 98.1 F (36.7 C), height 5\' 8"  (1.727 m), weight 195 lb (88.5 kg), SpO2 97 %. Body mass index is 29.65 kg/m.  General: Cooperative, alert, well developed, in no acute distress. HEENT: Conjunctivae and lids unremarkable. Cardiovascular: Regular  rhythm.  Lungs: Normal work of breathing. Neurologic: No focal deficits.   Lab Results  Component Value Date   CREATININE 0.76 01/11/2021   BUN 13 01/11/2021   NA 142 01/11/2021   K 4.8 01/11/2021   CL 102  01/11/2021   CO2 25 01/11/2021   Lab Results  Component Value Date   ALT 31 01/11/2021   AST 28 01/11/2021   ALKPHOS 57 01/11/2021   BILITOT <0.2 01/11/2021   Lab Results  Component Value Date   HGBA1C 7.2 (A) 10/21/2020   HGBA1C 10.7 (A) 06/18/2020   HGBA1C 10.6 (A) 03/18/2020   HGBA1C 7.5 (A) 09/13/2019   HGBA1C 9.1 (A) 06/21/2019   Lab Results  Component Value Date   INSULIN 36.0 (H) 01/11/2021   INSULIN 24.2 04/30/2019   Lab Results  Component Value Date   TSH 1.33 09/13/2019   Lab Results  Component Value Date   CHOL 146 01/11/2021   HDL 54 01/11/2021   LDLCALC 75 01/11/2021   TRIG 90 01/11/2021   CHOLHDL 2.6 10/16/2020   Lab Results  Component Value Date   VD25OH 84.2 01/11/2021   VD25OH 25.8 (L) 04/30/2019   Lab Results  Component Value Date   WBC 8.7 09/13/2019   HGB 14.2 09/13/2019   HCT 42.7 09/13/2019   MCV 90.2 09/13/2019   PLT 276.0 09/13/2019    Attestation Statements:   Reviewed by clinician on day of visit: allergies, medications, problem list, medical history, surgical history, family history, social history, and previous encounter notes.  Coral Ceo, CMA, am acting as transcriptionist for Coralie Common, MD.   I have reviewed the above documentation for accuracy and completeness, and I agree with the above. - Coralie Common, MD

## 2021-04-08 ENCOUNTER — Telehealth: Payer: 59 | Admitting: Family

## 2021-04-08 ENCOUNTER — Encounter: Payer: Self-pay | Admitting: Family

## 2021-04-08 ENCOUNTER — Telehealth: Payer: Self-pay | Admitting: Physician Assistant

## 2021-04-08 ENCOUNTER — Other Ambulatory Visit: Payer: Self-pay

## 2021-04-08 VITALS — Ht 68.0 in | Wt 195.1 lb

## 2021-04-08 DIAGNOSIS — R6889 Other general symptoms and signs: Secondary | ICD-10-CM

## 2021-04-08 DIAGNOSIS — Z91199 Patient's noncompliance with other medical treatment and regimen due to unspecified reason: Secondary | ICD-10-CM

## 2021-04-08 LAB — POCT INFLUENZA A/B
Influenza A, POC: NEGATIVE
Influenza B, POC: NEGATIVE

## 2021-04-08 NOTE — Progress Notes (Signed)
MyChart Video Visit    Virtual Visit via Video Note   This visit type was conducted due to national recommendations for restrictions regarding the COVID-19 Pandemic (e.g. social distancing) in an effort to limit this patient's exposure and mitigate transmission in our community. This patient is at least at moderate risk for complications without adequate follow up. This format is felt to be most appropriate for this patient at this time. Physical exam was limited by quality of the video and audio technology used for the visit. CMA was able to get the patient set up on a video visit.  Patient location: Home. Patient and provider in visit Provider location: Office  I discussed the limitations of evaluation and management by telemedicine and the availability of in person appointments. The patient expressed understanding and agreed to proceed.  Visit Date: 04/08/2021  Today's healthcare provider: Jeanie Sewer, NP     Subjective:    Patient ID: Travis Owens, male    DOB: 1955-08-01, 65 y.o.   MRN: [id number]  Chief Complaint  Patient presents with   Headache    All symptoms started yesterday. He has taken 2 tylenol, it helped with headache.    Sore Throat   Fatigue   Chills         HPI Upper Respiratory Infection: Symptoms include fatigue, chills, headache,  sore throat. 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 Onset of symptoms was 1 day ago, gradually worsening since that time. He is drinking moderate amounts of fluids. Evaluation to date: none.  Treatment to date: Tylenol.   Past Medical History:  Diagnosis Date   Anxiety    Chest pain    Depression    Diabetes mellitus without complication ([place])    Fatty liver    GERD (gastroesophageal reflux disease)    Hyperlipidemia    Hypertension    Sleep apnea    has CPAP/does not use    Past Surgical History:  Procedure Laterality Date   COLONOSCOPY     Dr. Olevia Perches  2008    Outpatient  Medications Prior to Visit  Medication Sig Dispense Refill   atorvastatin (LIPITOR) 80 MG tablet TAKE 1 TABLET BY MOUTH ONCE DAILY AT 6 PM 90 tablet 3   Blood Glucose Monitoring Suppl (FREESTYLE LITE) DEVI Check Blood sugar twice daily 1 Device 0   Cyanocobalamin (B-12) 1000 MCG CAPS Take 1,000 Int'l Units by mouth.     empagliflozin (JARDIANCE) 10 MG TABS tablet Take 1 tablet (10 mg total) by mouth daily before breakfast. 30 tablet 5   escitalopram (LEXAPRO) 20 MG tablet Take 1 tablet (20 mg total) by mouth daily. 90 tablet 1   ezetimibe (ZETIA) 10 MG tablet Take 1 tablet (10 mg total) by mouth daily. 90 tablet 3   glucose blood (FREESTYLE LITE) test strip Check Blood sugar twice daily 100 each 12   Lancets (FREESTYLE) lancets Check blood sugar twice daily 100 each 12   losartan (COZAAR) 25 MG tablet Take 1 tablet (25 mg total) by mouth daily. 90 tablet 3   metFORMIN (GLUCOPHAGE) 1000 MG tablet Take 1 tablet (1,000 mg total) by mouth 2 (two) times daily. 180 tablet 1   metoprolol (TOPROL-XL) 200 MG 24 hr tablet Take 1 tablet (200 mg total) by mouth daily. 90 tablet 3   Multiple Vitamin (MULTIVITAMIN) tablet Take 1 tablet by mouth daily.     pantoprazole (PROTONIX) 40 MG tablet Take 1 tablet (40 mg  total) by mouth daily. 90 tablet 3   Semaglutide-Weight Management (WEGOVY) 2.4 MG/0.75ML SOAJ Inject 2.4 mg into the skin once a week. 3 mL 0   Zoster Vaccine Adjuvanted (SHINGRIX) injection TO BE ADMINISTERED BY PHARMACIST 1 each 1   No facility-administered medications prior to visit.    No Known Allergies      Objective:     Physical Exam Vitals and nursing note reviewed.  Constitutional:      General: She is not in acute distress.    Appearance: Normal appearance.  HENT:     Head: Normocephalic.  Pulmonary:     Effort: No respiratory distress.  Musculoskeletal:     Cervical back: Normal range of motion.  Skin:    General: Skin is dry.     Coloration: Skin is not pale.   Neurological:     Mental Status: She is alert and oriented to person, place, and time.  Psychiatric:        Mood and Affect: Mood normal.   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 Sx started yesterday, covid test is negative. Advised pt get tested for flu. Per parking lot testing, rapid flu is neg. Continue OTC sinus/cold medications including generic tylenol cold or flu, or Mucinex per box directions, generic Aleve bid (next 3 days only) or generic Tylenol 1,000mg  every 6 hours. Increase water intake to at least 2 liters daily.        Relevant Orders   POCT Influenza A/B (Completed)     I discussed the assessment and treatment plan with the patient. The patient was provided an opportunity to ask questions and all were answered. The patient agreed with the plan and demonstrated an understanding of the instructions.   The patient was advised to call back or seek an in-person evaluation if the symptoms worsen or if the condition fails to improve as anticipated.  I provided 20 minutes of face-to-face time during this encounter.   Jeanie Sewer, NP [place] [phone] (phone) [phone] (fax)  [place]

## 2021-04-08 NOTE — Assessment & Plan Note (Signed)
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 Sx started yesterday, covid test is negative. Advised pt get tested for flu. Per parking lot testing, rapid flu is neg. Continue OTC sinus/cold medications including generic tylenol cold or flu, or Mucinex per box directions, generic Aleve bid (next 3 days only) or generic Tylenol 1,000mg  every 6 hours. Increase water intake to at least 2 liters daily.

## 2021-04-08 NOTE — Progress Notes (Signed)
Let pt know he is negative for flu A & B - continue same home meds as we discussed on the phone, tylenol tid and generic Aleve bid, for the next 3-4 days. Corcidin sinus med OTC is safe for heart patients.

## 2021-04-08 NOTE — Progress Notes (Signed)
The patient no-showed for appointment despite this provider sending direct linkand waiting for at least 15 minutes from appointment time for patient to join. They will be marked as a NS for this appointment/time.   Mar Daring, PA-C

## 2021-04-13 ENCOUNTER — Other Ambulatory Visit (HOSPITAL_COMMUNITY): Payer: Self-pay

## 2021-04-15 ENCOUNTER — Other Ambulatory Visit (HOSPITAL_COMMUNITY): Payer: Self-pay

## 2021-04-16 ENCOUNTER — Encounter: Payer: Self-pay | Admitting: Cardiology

## 2021-04-16 ENCOUNTER — Ambulatory Visit: Payer: 59 | Admitting: Cardiology

## 2021-04-16 ENCOUNTER — Other Ambulatory Visit: Payer: Self-pay

## 2021-04-16 VITALS — BP 130/70 | HR 85 | Ht 70.0 in | Wt 200.8 lb

## 2021-04-16 DIAGNOSIS — E1169 Type 2 diabetes mellitus with other specified complication: Secondary | ICD-10-CM | POA: Diagnosis not present

## 2021-04-16 DIAGNOSIS — E785 Hyperlipidemia, unspecified: Secondary | ICD-10-CM

## 2021-04-16 DIAGNOSIS — I2510 Atherosclerotic heart disease of native coronary artery without angina pectoris: Secondary | ICD-10-CM | POA: Diagnosis not present

## 2021-04-16 DIAGNOSIS — I5042 Chronic combined systolic (congestive) and diastolic (congestive) heart failure: Secondary | ICD-10-CM | POA: Diagnosis not present

## 2021-04-16 DIAGNOSIS — I10 Essential (primary) hypertension: Secondary | ICD-10-CM | POA: Diagnosis not present

## 2021-04-16 DIAGNOSIS — I5022 Chronic systolic (congestive) heart failure: Secondary | ICD-10-CM | POA: Diagnosis not present

## 2021-04-16 LAB — LIPID PANEL
Chol/HDL Ratio: 3.2 ratio (ref 0.0–5.0)
Cholesterol, Total: 166 mg/dL (ref 100–199)
HDL: 52 mg/dL (ref >39)
LDL Chol Calc (NIH): 85 mg/dL (ref 0–99)
Triglycerides: 173 mg/dL — ABNORMAL HIGH (ref 0–149)
VLDL Cholesterol Cal: 29 mg/dL (ref 5–40)

## 2021-04-16 LAB — BASIC METABOLIC PANEL
BUN/Creatinine Ratio: 26 — ABNORMAL HIGH (ref 10–24)
BUN: 21 mg/dL (ref 8–27)
CO2: 26 mmol/L (ref 20–29)
Calcium: 10.1 mg/dL (ref 8.6–10.2)
Chloride: 97 mmol/L (ref 96–106)
Creatinine, Ser: 0.82 mg/dL (ref 0.76–1.27)
Glucose: 104 mg/dL — ABNORMAL HIGH (ref 70–99)
Potassium: 4.7 mmol/L (ref 3.5–5.2)
Sodium: 136 mmol/L (ref 134–144)
eGFR: 97 mL/min/{1.73_m2} (ref >59)

## 2021-04-16 LAB — MAGNESIUM: Magnesium: 1.9 mg/dL (ref 1.6–2.3)

## 2021-04-16 NOTE — Progress Notes (Signed)
Cardiology Office Note:    Date:  04/16/2021   ID:  Travis Owens, DOB [birth year], MRN [id number]  PCP:  [name], MD  Cardiologist:  Donato Heinz, MD  Electrophysiologist:  None   Referring MD: [name], MD   No chief complaint on file.    History of Present Illness:    Travis Owens is a 65 y.o. male with a hx of chronic combined systolic and diastolic heart failure, type 2 diabetes, hypertension, hyperlipidemia, OSA who present for follow-up.  He was is referred by Dr. Leafy Ro for evaluation of left bundle branch block on 05/01/18.  He was referred to Dr. Leafy Ro for weight loss evaluation, an EKG was done which showed left bundle branch block.  TTE was done on 05/03/2019 and showed EF 40 to 45%, with marked marked septal-lateral dyssynchrony consistent with LBBB.  Coronary CTA on 06/06/2019 showed nonobstructive CAD (calcified plaque in the RPDA causing mild (25-49%) stenosis and calcified plaque in the proximal LAD and proximal RCA causing minimal (0-24%) stenosis).  Calcium score 111 (62nd percentile for age/gender).  CMR on 07/25/2019 showed EF 45%, no LGE, RV EF 37%.  Since last clinic visit, he reports that he is doing well.  Reports lightheadedness resolved with stopping Entresto.  He denies any chest pain, dyspnea, lightheadedness, syncope, lower extremity edema, or palpitations.  His weight is down 8 pounds in last 6 months, has lost a total of 30 pounds.  He has been off his BiPAP for his OSA, as he is waiting on a new mask.  He exercises by walking and doing yard work.   BP Readings from Last 3 Encounters:  04/16/21 130/70  04/05/21 102/61  03/08/21 (!) 92/52   Wt Readings from Last 3 Encounters:  04/16/21 200 lb 12.8 oz (91.1 kg)  04/08/21 195 lb 1.7 oz (88.5 kg)  04/05/21 195 lb (88.5 kg)    Past Medical History:  Diagnosis Date   Anxiety    Chest pain    Depression    Diabetes mellitus without complication ([place])    Fatty liver    GERD  (gastroesophageal reflux disease)    Hyperlipidemia    Hypertension    Sleep apnea    has CPAP/does not use    Past Surgical History:  Procedure Laterality Date   COLONOSCOPY     Dr. Olevia Perches  2008    Current Medications: Current Meds  Medication Sig   atorvastatin (LIPITOR) 80 MG tablet TAKE 1 TABLET BY MOUTH ONCE DAILY AT 6 PM   Blood Glucose Monitoring Suppl (FREESTYLE LITE) DEVI Check Blood sugar twice daily   Cyanocobalamin (B-12) 1000 MCG CAPS Take 1,000 Int'l Units by mouth.   empagliflozin (JARDIANCE) 10 MG TABS tablet Take 1 tablet (10 mg total) by mouth daily before breakfast.   escitalopram (LEXAPRO) 20 MG tablet Take 1 tablet (20 mg total) by mouth daily.   ezetimibe (ZETIA) 10 MG tablet Take 1 tablet (10 mg total) by mouth daily.   glucose blood (FREESTYLE LITE) test strip Check Blood sugar twice daily   Lancets (FREESTYLE) lancets Check blood sugar twice daily   losartan (COZAAR) 25 MG tablet Take 1 tablet (25 mg total) by mouth daily.   metFORMIN (GLUCOPHAGE) 1000 MG tablet Take 1 tablet (1,000 mg total) by mouth 2 (two) times daily.   metoprolol (TOPROL-XL) 200 MG 24 hr tablet Take 1 tablet (200 mg total) by mouth daily.   Multiple Vitamin (MULTIVITAMIN) tablet Take 1 tablet  by mouth daily.   pantoprazole (PROTONIX) 40 MG tablet Take 1 tablet (40 mg total) by mouth daily.   Semaglutide-Weight Management (WEGOVY) 2.4 MG/0.75ML SOAJ Inject 2.4 mg into the skin once a week.   Zoster Vaccine Adjuvanted [facility]) injection TO BE ADMINISTERED BY PHARMACIST     Allergies:   Patient has no known allergies.   Social History   Socioeconomic History   Marital status: Married    Spouse name: Nathanuel Cabreja   Number of children: Not on file   Years of education: Not on file   Highest education level: Not on file  Occupational History   Occupation: Theme park manager    Comment: [employer]  Tobacco Use   Smoking status: Never   Smokeless tobacco: Never  Vaping Use    Vaping Use: Never used  Substance and Sexual Activity   Alcohol use: No   Drug use: No   Sexual activity: Yes  Other Topics Concern   Not on file  Social History Narrative   Not on file   Social Determinants of Health   Financial Resource Strain: Not on file  Food Insecurity: Not on file  Transportation Needs: Not on file  Physical Activity: Not on file  Stress: Not on file  Social Connections: Not on file     Family History: The patient's family history includes Cancer in his father; Hypertension in his paternal grandmother; Stroke in his father and paternal grandfather. There is no history of Colon cancer, Colon polyps, Esophageal cancer, Rectal cancer, or Stomach cancer.  ROS:   Please see the history of present illness.    All other systems reviewed and are negative.  EKGs/Labs/Other Studies Reviewed:    The following studies were reviewed today:   EKG:   04/16/21: LBBB, NSR, rate 85 10/16/2020: EKG is not ordered today. 07/14/2020: normal sinus rhythm, rate 86, left bundle branch block  TTE 05/03/19:  1. Left ventricular ejection fraction, by visual estimation, is 40 to 45%. The left ventricle has mild to moderately decreased function. There is mildly increased left ventricular hypertrophy. There is marked septal-lateral dyssynchrony consistent with LBBB.  2. Left ventricular diastolic parameters are consistent with Grade I diastolic dysfunction (impaired relaxation).  3. Global right ventricle has normal systolic function.The right ventricular size is normal. No increase in right ventricular wall thickness.  4. Left atrial size was normal.  5. Right atrial size was normal.  6. The mitral valve is normal in structure. No evidence of mitral valve regurgitation. No evidence of mitral stenosis.  7. The tricuspid valve is normal in structure.  8. The aortic valve is tricuspid. Aortic valve regurgitation is not visualized. Mild aortic valve sclerosis without stenosis  9. TR  signal is inadequate for assessing pulmonary artery systolic pressure. 10. The inferior vena cava is normal in size with greater than 50% respiratory variability, suggesting right atrial pressure of 3 mmHg.  Coronary CTA 06/06/19: 1. Coronary calcium score of 111. This was 73 percentile for age and sex matched control.   2. Normal coronary origin with right dominance.   3. Nonobstructive CAD. There is calcified plaque in the RPDA causing mild (25-49%) stenosis and calcified plaque in the proximal LAD and proximal RCA causing minimal (0-24%) stenosis   CAD-RADS 2. Mild non-obstructive CAD (25-49%). Consider non-atherosclerotic causes of chest pain. Consider preventive therapy and risk factor modification.1. Coronary calcium score of 111. This was 79 percentile for age and sex matched control.   2. Normal coronary origin  with right dominance.   3. Nonobstructive CAD. There is calcified plaque in the RPDA causing mild (25-49%) stenosis and calcified plaque in the proximal LAD and proximal RCA causing minimal (0-24%) stenosis   CAD-RADS 2. Mild non-obstructive CAD (25-49%). Consider non-atherosclerotic causes of chest pain. Consider preventive therapy and risk factor modification.  Noncardiac: IMPRESSION: No significant noncardiac findings. Incidental 6 mm densely calcified granuloma in the lingula.  CMR 07/25/19: 1. Normal LV size and thickness with markedly abnormal septal motion EF 45%. 2.  Possible LV non compaction seen best in SA/3 chamber views 3. No delayed gadolinium uptake, scar, infiltration, infarct in LV myocardium 4.  Basal RV dilatation and hypokinesis RVEF 37% 5   normal MV,AV, TV 6.  Normal Aortic root 3.0 cm 7.  Normal T2* 48 msec 8. Unable to calculate T1/ECG failure to acquire adequate T1 map sequence pre contrast      Recent Labs: 10/16/2020: Magnesium 1.6 01/11/2021: ALT 31; BUN 13; Creatinine, Ser 0.76; Potassium 4.8; Sodium 142  Recent Lipid Panel     Component Value Date/Time   CHOL 146 01/11/2021 0830   TRIG 90 01/11/2021 0830   HDL 54 01/11/2021 0830   CHOLHDL 2.6 10/16/2020 1022   CHOLHDL 3 09/13/2019 0941   VLDL 33.2 09/13/2019 0941   LDLCALC 75 01/11/2021 0830    Physical Exam:    VS:  BP 130/70    Pulse 85    Ht 5\' 10"  (1.778 m)    Wt 200 lb 12.8 oz (91.1 kg)    SpO2 96%    BMI 28.81 kg/m     Wt Readings from Last 3 Encounters:  04/16/21 200 lb 12.8 oz (91.1 kg)  04/08/21 195 lb 1.7 oz (88.5 kg)  04/05/21 195 lb (88.5 kg)     GEN:   in no acute distress HEENT: Normal NECK: No JVD CARDIAC:RRR, no murmurs, rubs, gallops RESPIRATORY:  Clear to auscultation without rales, wheezing or rhonchi  ABDOMEN: Soft, non-tender, non-distended MUSCULOSKELETAL:  No edema; No deformity  SKIN: Warm and dry NEUROLOGIC:  Alert and oriented x 3 PSYCHIATRIC:  Normal affect   ASSESSMENT:    1. Chronic combined systolic and diastolic heart failure ([place])   2. Hyperlipidemia associated with type 2 diabetes mellitus ([place])   3. Coronary artery disease involving native coronary artery of native heart without angina pectoris   4. Essential hypertension   5. Hyperlipidemia with target LDL less than 70      PLAN:    Chronic combined systolic and diastolic heart failure: EF 40 to 45% on TTE from 05/02/18, with marked septal-lateral dyssynchrony consistent with LBBB.  Coronary CTA showed nonobstructive CAD.  CMR showed EF 45%, no LGE.  Appears euvolemic.  Suspect systolic dysfunction due to LBBB -Continue losartan 25 mg daily.  Previously was on Entresto but discontinued due to low BP. -Continue Toprol-XL 200 mg daily -Continue Jardiance 10 mg daily -Check BMP/magnesium.  If stable renal function/potassium, will plan to add spironolactone 12.5 mg daily -Plan repeat echocardiogram prior to next clinic appointment to monitor systolic function  Coronary artery disease: coronary CTA on 06/06/2019 showed nonobstructive CAD (calcified plaque in  the RPDA causing mild (25-49%) stenosis and calcified plaque in the proximal LAD and proximal RCA causing minimal (0-24%) stenosis.  Calcium score 111 (62nd percentile for age/gender). -Continue atorvastatin 80 mg daily -Stopped taking aspirin 81 mg, can hold off for now  Hypertension: Continue Toprol-XL, losartan, plan to add spironolactone as above  Hyperlipidemia: Continue atorvastatin 80  mg daily and Zetia 10 mg daily.  LDL 75 12/2020, will recheck lipid panel  Type 2 diabetes: [name] 7.2% on 12/2020.   Worsened to 10.7% on 06/18/20.  On metformin, semaglutide.  Started on Jardiance 10 mg daily  OSA: Sleep study on 05/15/2019 confirmed OSA, started on BiPAP  RTC in 6 months  Medication Adjustments/Labs and Tests Ordered: Current medicines are reviewed at length with the patient today.  Concerns regarding medicines are outlined above.  Orders Placed This Encounter  Procedures   Basic metabolic panel   Lipid panel   Magnesium   EKG 12-Lead   ECHOCARDIOGRAM COMPLETE    No orders of the defined types were placed in this encounter.    Patient Instructions  Medication Instructions:  No Changes In Medications at this time.  *If you need a refill on your cardiac medications before your next appointment, please call your pharmacy*  Lab Work: BMET, MAG, [place]  If you have labs (blood work) drawn today and your tests are completely normal, you will receive your results only by: MyChart Message (if you have MyChart) OR A paper copy in the mail If you have any lab test that is abnormal or we need to change your treatment, we will call you to review the results.  Testing/Procedures: Your physician has requested that you have an echocardiogram IN 6 MONTHS. Echocardiography is a painless test that uses sound waves to create images of your heart. It provides your doctor with information about the size and shape of your heart and how well your hearts chambers and valves are working.  You may receive an ultrasound enhancing agent through an IV if needed to better visualize your heart during the echo.This procedure takes approximately one hour. There are no restrictions for this procedure. This will take place at the [address].   Follow-Up: At [facility], you and your health needs are our priority.  As part of our continuing mission to provide you with exceptional heart care, we have created designated Provider Care Teams.  These Care Teams include your primary Cardiologist (physician) and Advanced Practice Providers (APPs -  Physician Assistants and Nurse Practitioners) who all work together to provide you with the care you need, when you need it.  Your next appointment:   6 month(s) RIGHT AFTER ECHO  The format for your next appointment:   In Person  Provider:   Donato Heinz, MD      [facility] [place],acting as a scribe for Donato Heinz, MD.,have documented all relevant documentation on the behalf of Donato Heinz, MD,as directed by  Donato Heinz, MD while in the presence of Donato Heinz, MD.  I, Donato Heinz, MD, have reviewed all documentation for this visit. The documentation on 04/16/21 for the exam, diagnosis, procedures, and orders are all accurate and complete.   Signed, Donato Heinz, MD  04/16/2021 8:32 AM    [facility]

## 2021-04-16 NOTE — Patient Instructions (Signed)
Medication Instructions:  No Changes In Medications at this time.  *If you need a refill on your cardiac medications before your next appointment, please call your pharmacy*  Lab Work: BMET, MAG, [place]  If you have labs (blood work) drawn today and your tests are completely normal, you will receive your results only by: MyChart Message (if you have MyChart) OR A paper copy in the mail If you have any lab test that is abnormal or we need to change your treatment, we will call you to review the results.  Testing/Procedures: Your physician has requested that you have an echocardiogram IN 6 MONTHS. Echocardiography is a painless test that uses sound waves to create images of your heart. It provides your doctor with information about the size and shape of your heart and how well your hearts chambers and valves are working. You may receive an ultrasound enhancing agent through an IV if needed to better visualize your heart during the echo.This procedure takes approximately one hour. There are no restrictions for this procedure. This will take place at the [address]   Follow-Up: At [facility], you and your health needs are our priority.  As part of our continuing mission to provide you with exceptional heart care, we have created designated Provider Care Teams.  These Care Teams include your primary Cardiologist (physician) and Advanced Practice Providers (APPs -  Physician Assistants and Nurse Practitioners) who all work together to provide you with the care you need, when you need it.  Your next appointment:   6 month(s) RIGHT AFTER ECHO  The format for your next appointment:   In Person  Provider:   Donato Heinz, MD

## 2021-04-20 ENCOUNTER — Other Ambulatory Visit: Payer: Self-pay

## 2021-04-20 ENCOUNTER — Other Ambulatory Visit (HOSPITAL_COMMUNITY): Payer: Self-pay

## 2021-04-20 DIAGNOSIS — Z79899 Other long term (current) drug therapy: Secondary | ICD-10-CM

## 2021-04-20 DIAGNOSIS — E785 Hyperlipidemia, unspecified: Secondary | ICD-10-CM

## 2021-04-20 MED ORDER — SPIRONOLACTONE 25 MG PO TABS
12.5000 mg | ORAL_TABLET | Freq: Every day | ORAL | 3 refills | Status: DC
  Filled 2021-04-20: qty 45, 90d supply, fill #0
  Filled 2021-07-23: qty 45, 90d supply, fill #1

## 2021-04-20 NOTE — Progress Notes (Signed)
° °  Travis Owens is a 65 y.o. male who presents today for an office visit.  Assessment/Plan:  Chronic Problems Addressed Today: Type 2 diabetes mellitus with hyperglycemia, without long-term current use of insulin ([name]) [name] improved to 6.1.  He has been working hard on lifestyle modifications.  Congratulated patient on weight loss and his lifestyle changes.  We will continue current medication regimen with Wegovy 2.4 mg weekly, metformin 1000 mg twice daily, Jardiance 10 mg daily.  Recheck [name] 6 months.  Depending on results may be able to decrease dose of metformin.  Hypertension associated with diabetes ([place]) Follows with cardiology.  He had to stop Entresto due to his dizziness.  His blood pressure is at goal today on spironolactone 12.5 mg daily, metoprolol succinate 200 mg daily and losartan 25 mg daily.  Chronic systolic heart failure ([place]) Follows with cardiology.  No signs of volume overload.  We will continue regimen per cardiology.    Subjective:  HPI:  He is here to follow up on diabetes mellitus.   His [name] was stable at office today. He is on semaglutide 2.4 mg weekly, metformin 1000 mg twice daily, and Jardiance 10 mg daily. He is complaint with his medication. No side effects. He is trying to cut out sugar and eat less carbs. He is down 6 pounds since our last visit.   He is currently on spirolactone 25 mg daily and metoprolol 200 mg daily for hypertension. He was on Entresto 24-26 twice daily in the past.  However, this was stopped due to dizziness. He notes he was feeling dizzy when he was working in the yard. Did not lose consciousness. He notes dizziness is resolved since then.        Objective:  Physical Exam: BP 112/74    Pulse 85    Temp 98.2 F (36.8 C)    Ht 5\' 10"  (1.778 m)    Wt 202 lb 4 oz (91.7 kg)    SpO2 96%    BMI 29.02 kg/m   Gen: No acute distress, resting comfortably Neuro: Grossly normal, moves all extremities Psych: Normal affect and thought  content       I,Savera Zaman,acting as a scribe for Dimas Chyle, MD.,have documented all relevant documentation on the behalf of Dimas Chyle, MD,as directed by  Dimas Chyle, MD while in the presence of Dimas Chyle, MD.   I, Dimas Chyle, MD, have reviewed all documentation for this visit. The documentation on 04/22/21 for the exam, diagnosis, procedures, and orders are all accurate and complete.  [name]. [name], MD 04/22/2021 10:49 AM

## 2021-04-22 ENCOUNTER — Ambulatory Visit: Payer: 59 | Admitting: Family Medicine

## 2021-04-22 ENCOUNTER — Other Ambulatory Visit: Payer: Self-pay

## 2021-04-22 ENCOUNTER — Encounter: Payer: Self-pay | Admitting: Family Medicine

## 2021-04-22 VITALS — BP 112/74 | HR 85 | Temp 98.2°F | Ht 70.0 in | Wt 202.2 lb

## 2021-04-22 DIAGNOSIS — Z23 Encounter for immunization: Secondary | ICD-10-CM

## 2021-04-22 DIAGNOSIS — E1165 Type 2 diabetes mellitus with hyperglycemia: Secondary | ICD-10-CM | POA: Diagnosis not present

## 2021-04-22 DIAGNOSIS — I5022 Chronic systolic (congestive) heart failure: Secondary | ICD-10-CM

## 2021-04-22 DIAGNOSIS — E1159 Type 2 diabetes mellitus with other circulatory complications: Secondary | ICD-10-CM

## 2021-04-22 DIAGNOSIS — I10 Essential (primary) hypertension: Secondary | ICD-10-CM | POA: Diagnosis not present

## 2021-04-22 LAB — POCT GLYCOSYLATED HEMOGLOBIN (HGB A1C): Hemoglobin A1C: 6.1 % — AB (ref 4.0–5.6)

## 2021-04-22 NOTE — Assessment & Plan Note (Signed)
Follows with cardiology.  He had to stop Entresto due to his dizziness.  His blood pressure is at goal today on spironolactone 12.5 mg daily, metoprolol succinate 200 mg daily and losartan 25 mg daily.

## 2021-04-22 NOTE — Assessment & Plan Note (Signed)
Follows with cardiology.  No signs of volume overload.  We will continue regimen per cardiology.

## 2021-04-22 NOTE — Patient Instructions (Signed)
It was very nice to see you today!  Your blood sugar looks great today.  No medication changes.  Please continue working on diet and exercise.  I will see you back in 6 months.  Come back to see me sooner if needed.  Take care, Dr [name]  PLEASE NOTE:  If you had any lab tests please let us know if you have not heard back within a few days. You may see your results on mychart before we have a chance to review them but we will give you a call once they are reviewed by [place]. If we ordered any referrals today, please let us know if you have not heard from their office within the next week.   Please try these tips to maintain a healthy lifestyle:  Eat at least 3 REAL meals and 1-2 snacks per day.  Aim for no more than 5 hours between eating.  If you eat breakfast, please do so within one hour of getting up.   Each meal should contain half fruits/vegetables, one quarter protein, and one quarter carbs (no bigger than a computer mouse)  Cut down on sweet beverages. This includes juice, soda, and sweet tea.   Drink at least 1 glass of water with each meal and aim for at least 8 glasses per day  Exercise at least 150 minutes every week.

## 2021-04-22 NOTE — Assessment & Plan Note (Signed)
[name] improved to 6.1.  He has been working hard on lifestyle modifications.  Congratulated patient on weight loss and his lifestyle changes.  We will continue current medication regimen with Wegovy 2.4 mg weekly, metformin 1000 mg twice daily, Jardiance 10 mg daily.  Recheck [name] 6 months.  Depending on results may be able to decrease dose of metformin.

## 2021-04-28 ENCOUNTER — Other Ambulatory Visit: Payer: Self-pay

## 2021-04-28 DIAGNOSIS — Z79899 Other long term (current) drug therapy: Secondary | ICD-10-CM

## 2021-04-29 LAB — BASIC METABOLIC PANEL
BUN/Creatinine Ratio: 20 (ref 10–24)
BUN: 17 mg/dL (ref 8–27)
CO2: 28 mmol/L (ref 20–29)
Calcium: 10.3 mg/dL — ABNORMAL HIGH (ref 8.6–10.2)
Chloride: 100 mmol/L (ref 96–106)
Creatinine, Ser: 0.87 mg/dL (ref 0.76–1.27)
Glucose: 113 mg/dL — ABNORMAL HIGH (ref 70–99)
Potassium: 5 mmol/L (ref 3.5–5.2)
Sodium: 141 mmol/L (ref 134–144)
eGFR: 96 mL/min/{1.73_m2} (ref >59)

## 2021-05-03 ENCOUNTER — Encounter: Payer: Self-pay | Admitting: *Deleted

## 2021-05-04 ENCOUNTER — Other Ambulatory Visit: Payer: Self-pay

## 2021-05-04 ENCOUNTER — Encounter (INDEPENDENT_AMBULATORY_CARE_PROVIDER_SITE_OTHER): Payer: Self-pay | Admitting: Family Medicine

## 2021-05-04 ENCOUNTER — Other Ambulatory Visit (HOSPITAL_COMMUNITY): Payer: Self-pay

## 2021-05-04 ENCOUNTER — Ambulatory Visit (INDEPENDENT_AMBULATORY_CARE_PROVIDER_SITE_OTHER): Payer: 59 | Admitting: Family Medicine

## 2021-05-04 VITALS — BP 101/66 | HR 89 | Temp 98.4°F | Ht 70.0 in | Wt 194.0 lb

## 2021-05-04 DIAGNOSIS — Z7984 Long term (current) use of oral hypoglycemic drugs: Secondary | ICD-10-CM | POA: Diagnosis not present

## 2021-05-04 DIAGNOSIS — E1169 Type 2 diabetes mellitus with other specified complication: Secondary | ICD-10-CM

## 2021-05-04 DIAGNOSIS — E1165 Type 2 diabetes mellitus with hyperglycemia: Secondary | ICD-10-CM

## 2021-05-04 DIAGNOSIS — E785 Hyperlipidemia, unspecified: Secondary | ICD-10-CM

## 2021-05-04 DIAGNOSIS — Z6829 Body mass index (BMI) 29.0-29.9, adult: Secondary | ICD-10-CM

## 2021-05-04 DIAGNOSIS — E669 Obesity, unspecified: Secondary | ICD-10-CM

## 2021-05-04 MED ORDER — WEGOVY 2.4 MG/0.75ML ~~LOC~~ SOAJ
2.4000 mg | SUBCUTANEOUS | 0 refills | Status: DC
  Filled 2021-05-04: qty 3, 28d supply, fill #0

## 2021-05-05 NOTE — Progress Notes (Signed)
Chief Complaint:   OBESITY Travis Owens is here to discuss his progress with his obesity treatment plan along with follow-up of his obesity related diagnoses. Travis Owens is on the Category 4 Plan and states he is following his eating plan approximately 25% of the time. [name] states he is not currently exercising.  Today's visit was #: 42 Starting weight: 220 lbs Starting date: 04/30/2019 Today's weight: 194 lbs Today's date: 05/04/2021 Total lbs lost to date: 26 Total lbs lost since last in-office visit: 1  Interim History: Pt had a good holiday season- ate some indulgent food and stayed local. He hasn't gotten as much exercise in over the last few weeks due to colder temperatures. Pt has noticed a loss of muscle. He wants to recommit to meal plan. His biggest obstacle in the next few weeks will be eating out.  Subjective:   1. Type 2 diabetes mellitus with hyperglycemia, without long-term current use of insulin ([name]) Travis Owens's recent [name] was 6.1. He is on GLP-1, Jardiance, and Glucophage.  2. Hyperlipidemia associated with type 2 diabetes mellitus (Travis Owens) Pt has a total cholesterol of 166, LDL of 85, HDL 52, and triglycerides 173. He is on Lipitor and ezetimibe.  Assessment/Plan:   1. Type 2 diabetes mellitus with hyperglycemia, without long-term current use of insulin ([name]) Good blood sugar control is important to decrease the likelihood of diabetic complications such as nephropathy, neuropathy, limb loss, blindness, coronary artery disease, and death. Intensive lifestyle modification including diet, exercise and weight loss are the first line of treatment for diabetes. Continue current treatment plan. Not at goal.  2. Hyperlipidemia associated with type 2 diabetes mellitus ([place]) Cardiovascular risk and specific lipid/LDL goals reviewed.  We discussed several lifestyle modifications today and [name] will continue to work on diet, exercise and weight loss efforts. Orders and follow up as documented in  patient record. Continue current treatment plan. Repeat labs in April. Not at goal.  Counseling Intensive lifestyle modifications are the first line treatment for this issue. Dietary changes: Increase soluble fiber. Decrease simple carbohydrates. Exercise changes: Moderate to vigorous-intensity aerobic activity 150 minutes per week if tolerated. Lipid-lowering medications: see documented in medical record.  3. Obesity with current BMI of 29.7  Travis Owens is currently in the action stage of change. As such, his goal is to continue with weight loss efforts. He has agreed to the Category 4 Plan.   We discussed various medication options to help Rigley with his weight loss efforts and we both agreed to continue Wegovy 2.4 mg. Refill- Semaglutide-Weight Management (WEGOVY) 2.4 MG/0.75ML SOAJ; Inject 2.4 mg into the skin once a week.  Dispense: 3 mL; Refill: 0  Exercise goals:  Pt is to start using [employer] 10 minutes a day.  Behavioral modification strategies: increasing lean protein intake, meal planning and cooking strategies, keeping healthy foods in the home, and planning for success.  Travis Owens has agreed to follow-up with our clinic in 4 weeks. He was informed of the importance of frequent follow-up visits to maximize his success with intensive lifestyle modifications for his multiple health conditions.   Objective:   Blood pressure 101/66, pulse 89, temperature 98.4 F (36.9 C), height 5\' 10"  (1.778 m), weight 194 lb (88 kg), SpO2 100 %. Body mass index is 27.84 kg/m.  General: Cooperative, alert, well developed, in no acute distress. HEENT: Conjunctivae and lids unremarkable. Cardiovascular: Regular rhythm.  Lungs: Normal work of breathing. Neurologic: No focal deficits.   Lab Results  Component Value Date  CREATININE 0.87 04/28/2021   BUN 17 04/28/2021   NA 141 04/28/2021   K 5.0 04/28/2021   CL 100 04/28/2021   CO2 28 04/28/2021   Lab Results  Component Value Date   ALT 31  01/11/2021   AST 28 01/11/2021   ALKPHOS 57 01/11/2021   BILITOT <0.2 01/11/2021   Lab Results  Component Value Date   HGBA1C 6.1 (A) 04/22/2021   HGBA1C 7.2 (A) 10/21/2020   HGBA1C 10.7 (A) 06/18/2020   HGBA1C 10.6 (A) 03/18/2020   HGBA1C 7.5 (A) 09/13/2019   Lab Results  Component Value Date   INSULIN 36.0 (H) 01/11/2021   INSULIN 24.2 04/30/2019   Lab Results  Component Value Date   TSH 1.33 09/13/2019   Lab Results  Component Value Date   CHOL 166 04/16/2021   HDL 52 04/16/2021   LDLCALC 85 04/16/2021   TRIG 173 (H) 04/16/2021   CHOLHDL 3.2 04/16/2021   Lab Results  Component Value Date   VD25OH 84.2 01/11/2021   VD25OH 25.8 (L) 04/30/2019   Lab Results  Component Value Date   WBC 8.7 09/13/2019   HGB 14.2 09/13/2019   HCT 42.7 09/13/2019   MCV 90.2 09/13/2019   PLT 276.0 09/13/2019    Attestation Statements:   Reviewed by clinician on day of visit: allergies, medications, problem list, medical history, surgical history, family history, social history, and previous encounter notes.  Coral Ceo, CMA, am acting as transcriptionist for Coralie Common, MD.  I have reviewed the above documentation for accuracy and completeness, and I agree with the above. - Coralie Common, MD

## 2021-05-24 ENCOUNTER — Ambulatory Visit (INDEPENDENT_AMBULATORY_CARE_PROVIDER_SITE_OTHER): Payer: 59 | Admitting: Pharmacist Clinician (PhC)/ Clinical Pharmacy Specialist

## 2021-05-24 ENCOUNTER — Other Ambulatory Visit: Payer: Self-pay

## 2021-05-24 DIAGNOSIS — E1169 Type 2 diabetes mellitus with other specified complication: Secondary | ICD-10-CM | POA: Diagnosis not present

## 2021-05-24 DIAGNOSIS — E785 Hyperlipidemia, unspecified: Secondary | ICD-10-CM

## 2021-05-24 NOTE — Assessment & Plan Note (Signed)
Reviewed options for lowering LDL cholesterol, including PCSK-9 inhibitors, bempedoic acid and inclisiran.  Discussed mechanisms of action, dosing, side effects and potential decreases in LDL cholesterol.  Also reviewed cost information and potential options for patient assistance.  Answered all patient questions.  Based on this information, patient would prefer to start [place].  Reviewed injection technique (he is already on semaglutide).  Will repeat labs after 4-6 doses.

## 2021-05-24 NOTE — Progress Notes (Signed)
05/24/2021 Travis Owens [birth year] [id number]   HPI:  Travis Owens is a 66 y.o. male patient of Dr Gardiner Rhyme, who presents today for a lipid clinic evaluation.  See pertinent past medical history below.  He was unable to tolerate Entresto because of hypotension, currently on GDMT for CHF.   A coronary CT did show calcified plaque build-up and a score of 111.  Despite being on high intensity statin and ezetimibe, he has not reached his LDL goal of < 55.  He is in the office today to discuss additional cholesterol lowering options.    Past Medical History: CAD 2/21 Coronary calcium score 111 (62nd percentile)  CHF 04/2018 EF 40-45% - on losartan 25, metoprolol succ 200, empagliflozin 10  HTN Controlled with HF medications  DM2 9/22 [name] 7.2 on metformin, semaglutide, empagliflozin 10 mg  OSA On BiPAP   Current Medications: atorvastatin 80 mg, ezetimibe 10 mg  Cholesterol Goals: LDL < 55   Intolerant/previously tried: none  Family history: father died at 73, smoker w/lung cancer; mother died at 27 old age; 1 brother no heart disease; no children  Diet: Works with [employer]; high protein diet with low carb intake.  Has lost 30 lbs, hoping to drop 15 more  Exercise:  trying to walk, but limited by weather; has total gym trying to use more; does yard work  Labs: 12/22 TC 166, TG 173, HDL 52, LDL 85 (on atorvastatin 80, ezetimibe 10)   Current Outpatient Medications  Medication Sig Dispense Refill   acetaminophen (TYLENOL) 325 MG tablet Take 1 tablet by mouth as needed.     atorvastatin (LIPITOR) 80 MG tablet TAKE 1 TABLET BY MOUTH ONCE DAILY AT 6 PM 90 tablet 3   Blood Glucose Monitoring Suppl (FREESTYLE LITE) DEVI Check Blood sugar twice daily 1 Device 0   Cyanocobalamin (B-12) 1000 MCG CAPS Take 1,000 Int'l Units by mouth.     empagliflozin (JARDIANCE) 10 MG TABS tablet Take 1 tablet (10 mg total) by mouth daily before breakfast. 30 tablet 5   escitalopram (LEXAPRO)  20 MG tablet Take 1 tablet (20 mg total) by mouth daily. 90 tablet 1   ezetimibe (ZETIA) 10 MG tablet Take 1 tablet (10 mg total) by mouth daily. 90 tablet 3   glucose blood (FREESTYLE LITE) test strip Check Blood sugar twice daily 100 each 12   ibuprofen (ADVIL) 200 MG tablet Take 1 tablet by mouth as needed.     Lancets (FREESTYLE) lancets Check blood sugar twice daily 100 each 12   losartan (COZAAR) 25 MG tablet Take 1 tablet (25 mg total) by mouth daily. 90 tablet 3   metFORMIN (GLUCOPHAGE) 1000 MG tablet Take 1 tablet (1,000 mg total) by mouth 2 (two) times daily. 180 tablet 1   metoprolol (TOPROL-XL) 200 MG 24 hr tablet Take 1 tablet (200 mg total) by mouth daily. 90 tablet 3   Multiple Vitamin (MULTIVITAMIN) tablet Take 1 tablet by mouth daily.     pantoprazole (PROTONIX) 40 MG tablet Take 1 tablet (40 mg total) by mouth daily. 90 tablet 3   Semaglutide-Weight Management (WEGOVY) 2.4 MG/0.75ML SOAJ Inject 2.4 mg into the skin once a week. 3 mL 0   spironolactone (ALDACTONE) 25 MG tablet Take 0.5 tablets (12.5 mg total) by mouth daily. 45 tablet 3   Zoster Vaccine Adjuvanted (SHINGRIX) injection TO BE ADMINISTERED BY PHARMACIST 1 each 1   No current facility-administered medications for this visit.    No Known  Allergies  Past Medical History:  Diagnosis Date   Anxiety    Chest pain    Depression    Diabetes mellitus without complication ([place])    Fatty liver    GERD (gastroesophageal reflux disease)    Hyperlipidemia    Hypertension    Sleep apnea    has CPAP/does not use    Blood pressure 128/70, pulse 81, resp. rate 17, height 5\' 10"  (1.778 m), weight 207 lb (93.9 kg), SpO2 98 %.   Hyperlipidemia associated with type 2 diabetes mellitus ([place]) Reviewed options for lowering LDL cholesterol, including PCSK-9 inhibitors, bempedoic acid and inclisiran.  Discussed mechanisms of action, dosing, side effects and potential decreases in LDL cholesterol.  Also reviewed cost  information and potential options for patient assistance.  Answered all patient questions.  Based on this information, patient would prefer to start [place].  Reviewed injection technique (he is already on semaglutide).  Will repeat labs after 4-6 doses.     Tommy Medal PharmD CPP [facility] [address] [place], [place] [address] [phone]

## 2021-05-24 NOTE — Patient Instructions (Addendum)
Your Results:             Your most recent labs Goal  Total Cholesterol 166 < 200  Triglycerides 173 < 150  HDL (happy/good cholesterol) 52 > 40  LDL (lousy/bad cholesterol 85 < 55   Medication changes:  We will start the process to get [place] covered by your insurance company.  Once approved you will do one injection every 14 days.  Repeat labs after 4-6 doses   Thank you for choosing [facility]

## 2021-05-25 ENCOUNTER — Encounter (INDEPENDENT_AMBULATORY_CARE_PROVIDER_SITE_OTHER): Payer: Self-pay | Admitting: Family Medicine

## 2021-05-25 ENCOUNTER — Other Ambulatory Visit (HOSPITAL_COMMUNITY): Payer: Self-pay

## 2021-05-25 ENCOUNTER — Other Ambulatory Visit (INDEPENDENT_AMBULATORY_CARE_PROVIDER_SITE_OTHER): Payer: Self-pay | Admitting: Family Medicine

## 2021-05-25 ENCOUNTER — Ambulatory Visit (INDEPENDENT_AMBULATORY_CARE_PROVIDER_SITE_OTHER): Payer: 59 | Admitting: Family Medicine

## 2021-05-25 VITALS — BP 99/64 | HR 89 | Temp 97.8°F | Ht 70.0 in | Wt 201.0 lb

## 2021-05-25 DIAGNOSIS — Z7984 Long term (current) use of oral hypoglycemic drugs: Secondary | ICD-10-CM | POA: Diagnosis not present

## 2021-05-25 DIAGNOSIS — E669 Obesity, unspecified: Secondary | ICD-10-CM | POA: Diagnosis not present

## 2021-05-25 DIAGNOSIS — E785 Hyperlipidemia, unspecified: Secondary | ICD-10-CM | POA: Diagnosis not present

## 2021-05-25 DIAGNOSIS — E1165 Type 2 diabetes mellitus with hyperglycemia: Secondary | ICD-10-CM

## 2021-05-25 DIAGNOSIS — Z6828 Body mass index (BMI) 28.0-28.9, adult: Secondary | ICD-10-CM | POA: Diagnosis not present

## 2021-05-25 DIAGNOSIS — Z6833 Body mass index (BMI) 33.0-33.9, adult: Secondary | ICD-10-CM

## 2021-05-25 DIAGNOSIS — E1169 Type 2 diabetes mellitus with other specified complication: Secondary | ICD-10-CM | POA: Diagnosis not present

## 2021-05-25 MED FILL — Atorvastatin Calcium Tab 80 MG (Base Equivalent): ORAL | 90 days supply | Qty: 90 | Fill #2 | Status: AC

## 2021-05-25 NOTE — Progress Notes (Signed)
Chief Complaint:   OBESITY Travis Owens is here to discuss his progress with his obesity treatment plan along with follow-up of his obesity related diagnoses. Travis Owens is on the Category 4 Plan and states he is following his eating plan approximately 25% of the time. Travis Owens states he is not currently exercising.  Today's visit was #: 80 Starting weight: 220 lbs Starting date: 04/30/2019 Today's weight: 201 lbs Today's date: 05/25/2021 Total lbs lost to date: 19 Total lbs lost since last in-office visit: 0  Interim History: Pt has some interpersonal stress going on and so may have done some emotional eating. Pt wants to start walking again. Food wise, he wants to try to adhere to category 4 at least 50% of the time. His biggest obstacle is family friend from [place] staying with him for a week. Food is in the house.  Subjective:   1. Type 2 diabetes mellitus with hyperglycemia, without long-term current use of insulin ([name]) Pt is on Semaglutide, Jardiance, and Metformin. His last [name] was 6.1.  2. Hyperlipidemia associated with type 2 diabetes mellitus ([place]) Pt is on Lipitor and Zetia. Cardiology discussed starting a new cholesterol med.  Assessment/Plan:   1. Type 2 diabetes mellitus with hyperglycemia, without long-term current use of insulin ([name]) Good blood sugar control is important to decrease the likelihood of diabetic complications such as nephropathy, neuropathy, limb loss, blindness, coronary artery disease, and death. Intensive lifestyle modification including diet, exercise and weight loss are the first line of treatment for diabetes. Continue current treatment plan.  2. Hyperlipidemia associated with type 2 diabetes mellitus ([place]) Cardiovascular risk and specific lipid/LDL goals reviewed.  We discussed several lifestyle modifications today and Travis Owens will continue to work on diet, exercise and weight loss efforts. Orders and follow up as documented in patient record. F/u with cardiology  at next appt.  Counseling Intensive lifestyle modifications are the first line treatment for this issue. Dietary changes: Increase soluble fiber. Decrease simple carbohydrates. Exercise changes: Moderate to vigorous-intensity aerobic activity 150 minutes per week if tolerated. Lipid-lowering medications: see documented in medical record.  3. Obesity with current BMI of 28.8 Travis Owens is currently in the action stage of change. As such, his goal is to continue with weight loss efforts. He has agreed to the Category 4 Plan.   Exercise goals: All adults should avoid inactivity. Some physical activity is better than none, and adults who participate in any amount of physical activity gain some health benefits. Start walking 4 times a week at least 15 minutes.  Behavioral modification strategies: increasing lean protein intake, meal planning and cooking strategies, keeping healthy foods in the home, and planning for success.  Travis Owens has agreed to follow-up with our clinic in 3 weeks. He was informed of the importance of frequent follow-up visits to maximize his success with intensive lifestyle modifications for his multiple health conditions.   Objective:   Blood pressure 99/64, pulse 89, temperature 97.8 F (36.6 C), height 5\' 10"  (1.778 m), weight 201 lb (91.2 kg), SpO2 96 %. Body mass index is 28.84 kg/m.  General: Cooperative, alert, well developed, in no acute distress. HEENT: Conjunctivae and lids unremarkable. Cardiovascular: Regular rhythm.  Lungs: Normal work of breathing. Neurologic: No focal deficits.   Lab Results  Component Value Date   CREATININE 0.87 04/28/2021   BUN 17 04/28/2021   NA 141 04/28/2021   K 5.0 04/28/2021   CL 100 04/28/2021   CO2 28 04/28/2021   Lab Results  Component  Value Date   ALT 31 01/11/2021   AST 28 01/11/2021   ALKPHOS 57 01/11/2021   BILITOT <0.2 01/11/2021   Lab Results  Component Value Date   HGBA1C 6.1 (A) 04/22/2021   HGBA1C 7.2 (A)  10/21/2020   HGBA1C 10.7 (A) 06/18/2020   HGBA1C 10.6 (A) 03/18/2020   HGBA1C 7.5 (A) 09/13/2019   Lab Results  Component Value Date   INSULIN 36.0 (H) 01/11/2021   INSULIN 24.2 04/30/2019   Lab Results  Component Value Date   TSH 1.33 09/13/2019   Lab Results  Component Value Date   CHOL 166 04/16/2021   HDL 52 04/16/2021   LDLCALC 85 04/16/2021   TRIG 173 (H) 04/16/2021   CHOLHDL 3.2 04/16/2021   Lab Results  Component Value Date   VD25OH 84.2 01/11/2021   VD25OH 25.8 (L) 04/30/2019   Lab Results  Component Value Date   WBC 8.7 09/13/2019   HGB 14.2 09/13/2019   HCT 42.7 09/13/2019   MCV 90.2 09/13/2019   PLT 276.0 09/13/2019   Attestation Statements:   Reviewed by clinician on day of visit: allergies, medications, problem list, medical history, surgical history, family history, social history, and previous encounter notes.  Coral Ceo, CMA, am acting as transcriptionist for Coralie Common, MD.   I have reviewed the above documentation for accuracy and completeness, and I agree with the above. - Coralie Common, MD

## 2021-06-08 ENCOUNTER — Encounter (INDEPENDENT_AMBULATORY_CARE_PROVIDER_SITE_OTHER): Payer: Self-pay

## 2021-06-08 ENCOUNTER — Other Ambulatory Visit (INDEPENDENT_AMBULATORY_CARE_PROVIDER_SITE_OTHER): Payer: Self-pay | Admitting: Family Medicine

## 2021-06-08 ENCOUNTER — Other Ambulatory Visit (HOSPITAL_COMMUNITY): Payer: Self-pay

## 2021-06-08 DIAGNOSIS — Z6833 Body mass index (BMI) 33.0-33.9, adult: Secondary | ICD-10-CM

## 2021-06-08 DIAGNOSIS — E669 Obesity, unspecified: Secondary | ICD-10-CM

## 2021-06-08 NOTE — Telephone Encounter (Signed)
Dr.Ukleja 

## 2021-06-08 NOTE — Telephone Encounter (Signed)
Message sent to pt-CAS 

## 2021-06-10 ENCOUNTER — Other Ambulatory Visit (HOSPITAL_COMMUNITY): Payer: Self-pay

## 2021-06-10 ENCOUNTER — Other Ambulatory Visit (INDEPENDENT_AMBULATORY_CARE_PROVIDER_SITE_OTHER): Payer: Self-pay | Admitting: Family Medicine

## 2021-06-10 DIAGNOSIS — E669 Obesity, unspecified: Secondary | ICD-10-CM

## 2021-06-11 ENCOUNTER — Other Ambulatory Visit (HOSPITAL_COMMUNITY): Payer: Self-pay

## 2021-06-14 ENCOUNTER — Other Ambulatory Visit (HOSPITAL_COMMUNITY): Payer: Self-pay

## 2021-06-15 ENCOUNTER — Ambulatory Visit (INDEPENDENT_AMBULATORY_CARE_PROVIDER_SITE_OTHER): Payer: 59 | Admitting: Family Medicine

## 2021-06-15 ENCOUNTER — Encounter (INDEPENDENT_AMBULATORY_CARE_PROVIDER_SITE_OTHER): Payer: Self-pay | Admitting: Family Medicine

## 2021-06-15 ENCOUNTER — Other Ambulatory Visit (HOSPITAL_COMMUNITY): Payer: Self-pay

## 2021-06-15 ENCOUNTER — Other Ambulatory Visit: Payer: Self-pay

## 2021-06-15 VITALS — BP 100/56 | HR 81 | Temp 97.7°F | Ht 70.0 in | Wt 199.0 lb

## 2021-06-15 DIAGNOSIS — I152 Hypertension secondary to endocrine disorders: Secondary | ICD-10-CM

## 2021-06-15 DIAGNOSIS — E1159 Type 2 diabetes mellitus with other circulatory complications: Secondary | ICD-10-CM

## 2021-06-15 DIAGNOSIS — Z7985 Long-term (current) use of injectable non-insulin antidiabetic drugs: Secondary | ICD-10-CM

## 2021-06-15 DIAGNOSIS — Z6833 Body mass index (BMI) 33.0-33.9, adult: Secondary | ICD-10-CM

## 2021-06-15 DIAGNOSIS — Z6828 Body mass index (BMI) 28.0-28.9, adult: Secondary | ICD-10-CM

## 2021-06-15 DIAGNOSIS — E1165 Type 2 diabetes mellitus with hyperglycemia: Secondary | ICD-10-CM

## 2021-06-15 DIAGNOSIS — E669 Obesity, unspecified: Secondary | ICD-10-CM

## 2021-06-15 MED ORDER — WEGOVY 2.4 MG/0.75ML ~~LOC~~ SOAJ
2.4000 mg | SUBCUTANEOUS | 0 refills | Status: DC
  Filled 2021-06-15: qty 3, 28d supply, fill #0

## 2021-06-15 NOTE — Progress Notes (Signed)
Chief Complaint:   OBESITY Travis Owens is here to discuss his progress with his obesity treatment plan along with follow-up of his obesity related diagnoses. Travis Owens is on the Category 4 Plan and states he is following his eating plan approximately 25% of the time. Travis Owens states he is walking and doing yard work 30 minutes 1 times per week.  Today's visit was #: 31 Starting weight: 220 lbs Starting date: 04/30/2019 Today's weight: 199 lbs Today's date: 06/15/2021 Total lbs lost to date: 21 Total lbs lost since last in-office visit: 2  Interim History: Pt had a friend visiting from [place] for a few days. He did eat a few times with church. He feels he needs to push the reset button. He has done quite a bit of exercise in the last few weeks as well. Pt has recommitted to using his BiPAP as well.   Subjective:   1. Type 2 diabetes mellitus with hyperglycemia, without long-term current use of insulin ([name]) Travis Owens is on Wegovy and his last [name] was 6.1.  2. Hypertension associated with diabetes (Travis Owens) BP well controlled. Travis Owens denies chest pain/chest pressure/headache.  Assessment/Plan:   1. Type 2 diabetes mellitus with hyperglycemia, without long-term current use of insulin ([name]) Good blood sugar control is important to decrease the likelihood of diabetic complications such as nephropathy, neuropathy, limb loss, blindness, coronary artery disease, and death. Intensive lifestyle modification including diet, exercise and weight loss are the first line of treatment for diabetes. Continue GLP-1- not at goal yet.  2. Hypertension associated with diabetes (Travis Owens) Jagjit is working on healthy weight loss and exercise to improve blood pressure control. We will watch for signs of hypotension as he continues his lifestyle modifications. Continue current meds wit no change in doses. At goal. F/u with cardiology at previously scheduled appt.  3. Obesity with current BMI of 28.6 Travis Owens is currently in the action stage  of change. As such, his goal is to continue with weight loss efforts. He has agreed to the Category 4 Plan.   We discussed various medication options to help Travis Owens with his weight loss efforts and we both agreed to continue Wegovy 2.4 mg. Refill- Semaglutide-Weight Management (WEGOVY) 2.4 MG/0.75ML SOAJ; Inject 2.4 mg into the skin once a week.  Dispense: 3 mL; Refill: 0  Exercise goals: All adults should avoid inactivity. Some physical activity is better than none, and adults who participate in any amount of physical activity gain some health benefits.  Behavioral modification strategies: increasing lean protein intake, decreasing eating out, avoiding temptations, and planning for success.  Travis Owens has agreed to follow-up with our clinic in 3-4 weeks. He was informed of the importance of frequent follow-up visits to maximize his success with intensive lifestyle modifications for his multiple health conditions.   Objective:   Blood pressure (!) 100/56, pulse 81, temperature 97.7 F (36.5 C), height 5\' 10"  (1.778 m), weight 199 lb (90.3 kg), SpO2 98 %. Body mass index is 28.55 kg/m.  General: Cooperative, alert, well developed, in no acute distress. HEENT: Conjunctivae and lids unremarkable. Cardiovascular: Regular rhythm.  Lungs: Normal work of breathing. Neurologic: No focal deficits.   Lab Results  Component Value Date   CREATININE 0.87 04/28/2021   BUN 17 04/28/2021   NA 141 04/28/2021   K 5.0 04/28/2021   CL 100 04/28/2021   CO2 28 04/28/2021   Lab Results  Component Value Date   ALT 31 01/11/2021   AST 28 01/11/2021   ALKPHOS 57  01/11/2021   BILITOT <0.2 01/11/2021   Lab Results  Component Value Date   HGBA1C 6.1 (A) 04/22/2021   HGBA1C 7.2 (A) 10/21/2020   HGBA1C 10.7 (A) 06/18/2020   HGBA1C 10.6 (A) 03/18/2020   HGBA1C 7.5 (A) 09/13/2019   Lab Results  Component Value Date   INSULIN 36.0 (H) 01/11/2021   INSULIN 24.2 04/30/2019   Lab Results  Component Value  Date   TSH 1.33 09/13/2019   Lab Results  Component Value Date   CHOL 166 04/16/2021   HDL 52 04/16/2021   LDLCALC 85 04/16/2021   TRIG 173 (H) 04/16/2021   CHOLHDL 3.2 04/16/2021   Lab Results  Component Value Date   VD25OH 84.2 01/11/2021   VD25OH 25.8 (L) 04/30/2019   Lab Results  Component Value Date   WBC 8.7 09/13/2019   HGB 14.2 09/13/2019   HCT 42.7 09/13/2019   MCV 90.2 09/13/2019   PLT 276.0 09/13/2019    Attestation Statements:   Reviewed by clinician on day of visit: allergies, medications, problem list, medical history, surgical history, family history, social history, and previous encounter notes.  Coral Ceo, CMA, am acting as transcriptionist for Coralie Common, MD.   I have reviewed the above documentation for accuracy and completeness, and I agree with the above. - Coralie Common, MD

## 2021-06-22 DIAGNOSIS — Z6831 Body mass index (BMI) 31.0-31.9, adult: Secondary | ICD-10-CM | POA: Diagnosis not present

## 2021-06-22 DIAGNOSIS — E669 Obesity, unspecified: Secondary | ICD-10-CM | POA: Diagnosis not present

## 2021-06-22 DIAGNOSIS — U071 COVID-19: Secondary | ICD-10-CM | POA: Diagnosis not present

## 2021-06-25 DIAGNOSIS — G4733 Obstructive sleep apnea (adult) (pediatric): Secondary | ICD-10-CM | POA: Diagnosis not present

## 2021-06-29 ENCOUNTER — Other Ambulatory Visit (HOSPITAL_COMMUNITY): Payer: Self-pay

## 2021-07-05 NOTE — Progress Notes (Unsigned)
TeleHealth Visit:  Due to the COVID-19 pandemic, this visit was completed with telemedicine (audio/video) technology to reduce patient and provider exposure as well as to preserve personal protective equipment.   Travis Owens has verbally consented to this TeleHealth visit. The patient is located at home, the provider is located at home. The participants in this visit include the listed provider and patient. The visit was conducted today via MyChart video.  OBESITY Travis Owens is here to discuss his progress with his obesity treatment plan along with follow-up of his obesity related diagnoses.   Today's date: 07/05/2021 Today's visit was # 65 Starting weight: 220 lbs Starting date: 04/30/2019 Total weight loss: *** lbs Weight at last in office visit: 199 lbs Today's reported weight: *** lbs No weight reported. Weight change since last visit: ***    Today's visit was #: 55 Starting weight: 220 lbs Starting date: 04/30/2019 Today's weight: 199 lbs Today's date: 06/15/2021 Total lbs lost to date: 21 Total lbs lost since last in-office visit: 2  Interim History: ***  Nutrition Plan: the Category 4 Plan.  Hunger is {EWCONTROLASSESSMENT:24261}. Cravings are {EWCONTROLASSESSMENT:24261}.  Current exercise: {exercise types:16438} Assessment/Plan:  *** Obesity: Current BMI *** Travis Owens {CHL AMB IS/IS NOT:210130109} currently in the action stage of change. As such, his goal is to {MWMwtloss#1:210800005}. He has agreed to {MWMwtlossportion/plan2:23431}.   Exercise goals: {MWM EXERCISE RECS:23473} Continue exercise: {exercise types:16438}  Behavioral modification strategies: {MWMwtlossdietstrategies3:23432}.  Travis Owens has agreed to follow-up with our clinic in {NUMBER 1-10:22536} weeks.   No orders of the defined types were placed in this encounter.   There are no discontinued medications.   No orders of the defined types were placed in this encounter.     Objective:   VITALS: Per patient if  applicable, see vitals. GENERAL: Alert and in no acute distress. CARDIOPULMONARY: No increased WOB. Speaking in clear sentences.  PSYCH: Pleasant and cooperative. Speech normal rate and rhythm. Affect is appropriate. Insight and judgement are appropriate. Attention is focused, linear, and appropriate.  NEURO: Oriented as arrived to appointment on time with no prompting.   Lab Results  Component Value Date   CREATININE 0.87 04/28/2021   BUN 17 04/28/2021   NA 141 04/28/2021   K 5.0 04/28/2021   CL 100 04/28/2021   CO2 28 04/28/2021   Lab Results  Component Value Date   ALT 31 01/11/2021   AST 28 01/11/2021   ALKPHOS 57 01/11/2021   BILITOT <0.2 01/11/2021   Lab Results  Component Value Date   HGBA1C 6.1 (A) 04/22/2021   HGBA1C 7.2 (A) 10/21/2020   HGBA1C 10.7 (A) 06/18/2020   HGBA1C 10.6 (A) 03/18/2020   HGBA1C 7.5 (A) 09/13/2019   Lab Results  Component Value Date   INSULIN 36.0 (H) 01/11/2021   INSULIN 24.2 04/30/2019   Lab Results  Component Value Date   TSH 1.33 09/13/2019   Lab Results  Component Value Date   CHOL 166 04/16/2021   HDL 52 04/16/2021   LDLCALC 85 04/16/2021   TRIG 173 (H) 04/16/2021   CHOLHDL 3.2 04/16/2021   Lab Results  Component Value Date   WBC 8.7 09/13/2019   HGB 14.2 09/13/2019   HCT 42.7 09/13/2019   MCV 90.2 09/13/2019   PLT 276.0 09/13/2019   No results found for: IRON, TIBC, FERRITIN Lab Results  Component Value Date   VD25OH 84.2 01/11/2021   VD25OH 25.8 (L) 04/30/2019    Attestation Statements:   Reviewed by clinician on day of visit: allergies, medications,  problem list, medical history, surgical history, family history, social history, and previous encounter notes.  ***(delete if time-based billing not used)Time spent on visit including pre-visit chart review and post-visit charting and care was *** minutes.

## 2021-07-06 ENCOUNTER — Other Ambulatory Visit (HOSPITAL_COMMUNITY): Payer: Self-pay

## 2021-07-06 ENCOUNTER — Telehealth (INDEPENDENT_AMBULATORY_CARE_PROVIDER_SITE_OTHER): Payer: 59 | Admitting: Family Medicine

## 2021-07-06 ENCOUNTER — Encounter (INDEPENDENT_AMBULATORY_CARE_PROVIDER_SITE_OTHER): Payer: Self-pay | Admitting: Family Medicine

## 2021-07-06 ENCOUNTER — Other Ambulatory Visit: Payer: Self-pay

## 2021-07-06 DIAGNOSIS — Z6828 Body mass index (BMI) 28.0-28.9, adult: Secondary | ICD-10-CM

## 2021-07-06 DIAGNOSIS — Z7984 Long term (current) use of oral hypoglycemic drugs: Secondary | ICD-10-CM

## 2021-07-06 DIAGNOSIS — G4733 Obstructive sleep apnea (adult) (pediatric): Secondary | ICD-10-CM | POA: Diagnosis not present

## 2021-07-06 DIAGNOSIS — E1169 Type 2 diabetes mellitus with other specified complication: Secondary | ICD-10-CM

## 2021-07-06 DIAGNOSIS — E669 Obesity, unspecified: Secondary | ICD-10-CM

## 2021-07-06 MED ORDER — WEGOVY 2.4 MG/0.75ML ~~LOC~~ SOAJ
2.4000 mg | SUBCUTANEOUS | 0 refills | Status: DC
  Filled 2021-07-06: qty 3, 28d supply, fill #0

## 2021-07-12 ENCOUNTER — Other Ambulatory Visit: Payer: Self-pay

## 2021-07-12 ENCOUNTER — Ambulatory Visit: Payer: 59 | Admitting: Cardiovascular Disease

## 2021-07-12 ENCOUNTER — Encounter: Payer: Self-pay | Admitting: Cardiovascular Disease

## 2021-07-12 VITALS — BP 122/70 | HR 87 | Ht 68.0 in | Wt 209.0 lb

## 2021-07-12 DIAGNOSIS — G4733 Obstructive sleep apnea (adult) (pediatric): Secondary | ICD-10-CM | POA: Diagnosis not present

## 2021-07-12 DIAGNOSIS — E785 Hyperlipidemia, unspecified: Secondary | ICD-10-CM

## 2021-07-12 DIAGNOSIS — I10 Essential (primary) hypertension: Secondary | ICD-10-CM | POA: Diagnosis not present

## 2021-07-12 DIAGNOSIS — E118 Type 2 diabetes mellitus with unspecified complications: Secondary | ICD-10-CM

## 2021-07-12 DIAGNOSIS — Z9989 Dependence on other enabling machines and devices: Secondary | ICD-10-CM | POA: Diagnosis not present

## 2021-07-12 DIAGNOSIS — I5042 Chronic combined systolic (congestive) and diastolic (congestive) heart failure: Secondary | ICD-10-CM | POA: Diagnosis not present

## 2021-07-12 NOTE — Patient Instructions (Signed)
Medication Instructions:  ?The current medical regimen is effective;  continue present plan and medications as directed. Please refer to the Current Medication list given to you today.  ? ?*If you need a refill on your cardiac medications before your next appointment, please call your pharmacy* ? ?Lab Work:   Testing/Procedures:  ?NONE    NONE ? ?Follow-Up: ?Your next appointment:  12 month(s) In Person with DR Charlcie Cradle VISIT ? ?Please call our office 2 months in advance to schedule this appointment  ? ?At [facility], you and your health needs are our priority.  As part of our continuing mission to provide you with exceptional heart care, we have created designated Provider Care Teams.  These Care Teams include your primary Cardiologist (physician) and Advanced Practice Providers (APPs -  Physician Assistants and Nurse Practitioners) who all work together to provide you with the care you need, when you need it. ? ? ?

## 2021-07-12 NOTE — Progress Notes (Signed)
? ?Cardiology Office Note   ? ?Date:  07/18/2021  ? ?ID:  Travis Owens, DOB 30-Sep-1955, MRN [id number] ? ?PCP:  Travis Barrack, MD  ?Cardiologist:  Travis Majestic, MD (sleep): Dr. Gardiner Owens ? ?1 year follow-up sleep evaluation. ? ?History of Present Illness:  ?Travis Owens is a 66 y.o. male who is followed by Dr. Gardiner Owens for primary cardiology care.  He presents for a 1 year follow-up sleep evaluation.   ? ?Travis Owens has a history of chronic combined systolic and diastolic heart failure, type 2 diabetes mellitus, hypertension, as well as hyperlipidemia.  He has been demonstrated to have left bundle branch block.  Coronary CTA in February 2021 showed nonobstructive CAD with a calcium score of 111 (62nd percentile for age/gender.  CMR on July 25, 2019 showed an EF 45% with RV EF 37% and no late gadolinium enhancement. ? ?Due to concerns for obstructive sleep apnea, he was referred for a sleep study on May 23, 2019.  He met split-night criteria and on the diagnostic portion of the study sleep apnea was severe with an AHI of 50.5/h, RDI 76.1/h, and he was unable to achieve any REM sleep.  Oxygen desaturated to a nadir of 76%.  He underwent CPAP titration and was transitioned to BiPAP therapy due to central events.  He was titrated up to 18/14 but he did not have any REM sleep at that stage.   ? ?Prior to CPAP therapy, he admits to snoring, daytime sleepiness, frequent nocturia, nonrestorative sleep, as well as fatigability.  He typically goes to bed at midnight and may wake up around 6 or 7 AM.  He states remotely he had a sleep study 20 years ago. ? ?I saw him for initial sleep evaluation with me on January 02, 2020.  We were able to obtain several downloads.  It appears that his initial download  from November 12, 2019 through December 11, 2019 he was meeting usage compliance at 90 days but he just barely missed duration compliance with usage greater than 4 hours at only 67%.  His average usage was 4 hours and 45  minutes.  AHI was 12.2 and his 95th percentile pressure was 21.7/17.7.  Download in August showed decreased compliance but he had been out of town and forgot to take his machine with him.  AHI was 8.8 and 95th percentile pressure was 21.6/17.6.  [place] he has used CPAP with 100% compliance since his return in August 29.  However the download from August 10 through January 01, 2020 was inclusive of days where he did not have machine and therefore compliance was not met.  He does admit to feeling improved since initiating CPAP therapy, however, he still has residual daytime sleepiness.  An Epworth Sleepiness Scale score was endorsed in the office today and this endorsed at 13 as shown below: ? ?Epworth Sleepiness Scale: ?Situation   Chance of Dozing/Sleeping (0 = never , 1 = slight chance , 2 = moderate chance , 3 = high chance )  ? sitting and reading 2  ? watching TV 2  ? sitting inactive in a public place 1  ? being a passenger in a motor vehicle for an hour or more 2  ? lying down in the afternoon 3  ? sitting and talking to someone 0  ? sitting quietly after lunch (no alcohol) 3  ? while stopped for a few minutes in traffic  as the driver 0  ? Total Score  13  ? ?During his initial evaluation I discussed the importance of meeting compliance standards.  I made adjustment to his equipment and decreased his ramp time from 30 minutes down to 15 minutes.  I increased his minimum EPAP initiation to 14, and change his initial pressure support of 5 cm water pressure.  At the time he was consistently having 95th percentile pressures at almost 22/18.  I recommended that a download be obtained within 4 weeks. ? ?I last saw him on July 08, 2020.  At that time, Travis Owens felt well and was compliant with CPAP.  A download from February 14 through July 07, 2020 shows 80% of usage days with 73% of usage greater than 4 hours.  Average use was 6 hours and 42 minutes.   He is set at minimum EPAP pressure of 15 with maximum IPAP pressure 25 and his 95th percentile pressure now is 23.3/18.2 with maximum average pressure of 24.2/19.2.  AHI is 8.1.  An Epworth Sleepiness Scale score was calculated in the office today and this endorsed at 5 arguing against recurrent daytime sleepiness.  He feels that his sleep is significantly improved with therapy than prior to treatment.  He is unaware of breakthrough snoring.  During that evaluation, his AHI was elevated despite requiring high pressures.  As result I increase his minimum EPAP pressure to 17 so that his initial pressure will be 22/17 and changed his ramp start pressure from 4 cm up to 8 cm with ramp time at 15 minutes. ? ?Over the past year, he has continued to be seen by Travis Owens for his cardiology care.  He has continued to use CPAP, but he has had problems with his full facemask which was recently changed.  He also had COVID 3 weeks previously.  As result there were days of noncompliance.  A download from February 16 through July 09, 2020 demonstrated usage at 70% with average use at 4 hours and 12 minutes.  Apparently his pressures are now set at a minimum EPAP of 13 with maximum of 25.  AHI is elevated at 16.1 with apnea index 14.9 and central index 5.4.  He typically goes to bed at midnight and wakes up approximately every 8 AM.  An Epworth Sleepiness Scale score was calculated in the office today and this endorsed at 3.  He presents for evaluation. ? ?Past Medical History:  ?Diagnosis Date  ? Anxiety   ? Chest pain   ? Depression   ? Diabetes mellitus without complication ([place])   ? Fatty liver   ? GERD (gastroesophageal reflux disease)   ? Hyperlipidemia   ? Hypertension   ? Sleep apnea   ? has CPAP/does not use  ? ? ?Past Surgical History:  ?Procedure Laterality Date  ? COLONOSCOPY    ? Dr. Olevia Owens  2008  ? ? ?Current Medications: ?Outpatient Medications Prior to Visit  ?Medication Sig Dispense Refill  ? acetaminophen  (TYLENOL) 325 MG tablet Take 1 tablet by mouth as needed.    ? atorvastatin (LIPITOR) 80 MG tablet TAKE 1 TABLET BY MOUTH ONCE DAILY AT 6 PM 90 tablet 3  ? Blood Glucose Monitoring Suppl (FREESTYLE LITE) DEVI Check Blood sugar twice daily 1 Device 0  ? Cyanocobalamin (B-12) 1000 MCG CAPS Take 1,000 Int'l Units by mouth.    ? empagliflozin (JARDIANCE) 10 MG TABS tablet Take 1 tablet (10 mg total) by mouth daily before breakfast. 30 tablet  5  ? escitalopram (LEXAPRO) 20 MG tablet Take 1 tablet (20 mg total) by mouth daily. 90 tablet 1  ? ezetimibe (ZETIA) 10 MG tablet Take 1 tablet (10 mg total) by mouth daily. 90 tablet 3  ? glucose blood (FREESTYLE LITE) test strip Check Blood sugar twice daily 100 each 12  ? ibuprofen (ADVIL) 200 MG tablet Take 1 tablet by mouth as needed.    ? Lancets (FREESTYLE) lancets Check blood sugar twice daily 100 each 12  ? losartan (COZAAR) 25 MG tablet Take 1 tablet (25 mg total) by mouth daily. 90 tablet 3  ? metFORMIN (GLUCOPHAGE) 1000 MG tablet Take 1 tablet (1,000 mg total) by mouth 2 (two) times daily. 180 tablet 1  ? metoprolol (TOPROL-XL) 200 MG 24 hr tablet Take 1 tablet (200 mg total) by mouth daily. 90 tablet 3  ? Multiple Vitamin (MULTIVITAMIN) tablet Take 1 tablet by mouth daily.    ? pantoprazole (PROTONIX) 40 MG tablet Take 1 tablet (40 mg total) by mouth daily. 90 tablet 3  ? Semaglutide-Weight Management (WEGOVY) 2.4 MG/0.75ML SOAJ Inject 2.4 mg into the skin once a week. 3 mL 0  ? spironolactone (ALDACTONE) 25 MG tablet Take 0.5 tablets (12.5 mg total) by mouth daily. 45 tablet 3  ? Zoster Vaccine Adjuvanted (SHINGRIX) injection TO BE ADMINISTERED BY PHARMACIST 1 each 1  ? ?No facility-administered medications prior to visit.  ?  ? ?Allergies:   Patient has no known allergies.  ? ?Social History  ? ?Socioeconomic History  ? Marital status: Married  ?  Spouse name: Jin Capote  ? Number of children: Not on file  ? Years of education: Not on file  ? Highest education  level: Not on file  ?Occupational History  ? Occupation: Theme park manager  ?  Comment: [employer]  ?Tobacco Use  ? Smoking status: Never  ? Smokeless tobacco: Never  ?Vaping Use  ? Vaping Use: Never used

## 2021-07-18 ENCOUNTER — Encounter: Payer: Self-pay | Admitting: Cardiovascular Disease

## 2021-07-23 ENCOUNTER — Other Ambulatory Visit (HOSPITAL_COMMUNITY): Payer: Self-pay

## 2021-07-26 NOTE — Progress Notes (Signed)
?TeleHealth Visit:  ?Due to the COVID-19 pandemic, this visit was completed with telemedicine (audio/video) technology to reduce patient and provider exposure as well as to preserve personal protective equipment.  ? ?Travis Owens has verbally consented to this TeleHealth visit. The patient is located at home, the provider is located at home. The participants in this visit include the listed provider and patient. The visit was conducted today via phone call. Patient was unable to connect to video. Length of call was 18 minutes. ? ? ?OBESITY ?[name] is here to discuss his progress with his obesity treatment plan along with follow-up of his obesity related diagnoses.  ? ?Today's visit was # 42 ?Starting weight: 220 lbs ?Starting date: 04/30/19 ?Total weight loss: 21 lbs at last in office visit. ?Weight at last in office visit: 199 lbs on 06/15/21 ?Reported weight of 195 lbs at virtual visit on 07/06/21 ?Today's reported weight: 198.4 lbs (30.09 BMI) ?Height listed incorrectly since May 2021.  He reports a height of 5'8".  Appropriate height and weight entered today. ? ?Nutrition Plan: the Category 4 Plan about 25% of the time. ?Hunger is well controlled. Cravings are poorly controlled.  ?Current exercise: none ? ?Interim History: [name] reports being off plan a great deal over the past several weeks.  He is up 3 pounds since his last virtual visit..  Breakfast is generally on plan but then he deviates after that.  He tends to snack too much on foods such as Pringles and Goldfish. ? ?Assessment/Plan:  ?1. Type II Diabetes ?HgbA1c is at goal. ?CBGs: Not checking ?Any episodes of hypoglycemia? no ?Medication(s): Wegovy 2.4 mg weekly, Jardiance 10 mg daily, metformin 1000 mg twice daily. Compliant with all meds. ? ?Lab Results  ?Component Value Date  ? HGBA1C 6.1 (A) 04/22/2021  ? HGBA1C 7.2 (A) 10/21/2020  ? HGBA1C 10.7 (A) 06/18/2020  ? ?Lab Results  ?Component Value Date  ? [place] 85 04/16/2021  ? CREATININE 0.87 04/28/2021   ? ? ?Plan: ?Refill Wegovy 2.4 mg weekly. ?Continue Jardiance and metformin at current dosage.  ? ?2. OSA on BiPap ?He reports very good compliance with his BiPAP.  He has a new mask that covers his mouth only which she reports is very comfortable.  He notes he is resting well and awakens feeling refreshed. ? ?Plan: ?Follow-up as directed with Dr. Shelva Majestic.  Continue BiPAP use. ? ?3. Obesity: Current BMI 28.55 ?Rashod is currently in the action stage of change. As such, his goal is to continue with weight loss efforts. He has agreed to the Category 4 Plan.  ? ?Exercise goals: [name] is planning on being much more active since the weather has warmed.  He is planning on walking and doing yard work. ? ?Behavioral modification strategies: increasing lean protein intake, decreasing simple carbohydrates, keeping healthy foods in the home, and emotional eating strategies. ? ?[name] has agreed to follow-up with our clinic in 3 weeks.  ? ?No orders of the defined types were placed in this encounter. ? ? ?There are no discontinued medications.  ? ?No orders of the defined types were placed in this encounter. ?   ? ?Objective:  ? ?VITALS: Per patient if applicable, see vitals. ?GENERAL: Alert and in no acute distress. ?CARDIOPULMONARY: No increased WOB. Speaking in clear sentences.  ?PSYCH: Pleasant and cooperative. Speech normal rate and rhythm. Affect is appropriate. Insight and judgement are appropriate. Attention is focused, linear, and appropriate.  ?NEURO: Oriented as arrived to appointment on time with no prompting.  ? ?  Lab Results  ?Component Value Date  ? CREATININE 0.87 04/28/2021  ? BUN 17 04/28/2021  ? NA 141 04/28/2021  ? K 5.0 04/28/2021  ? CL 100 04/28/2021  ? CO2 28 04/28/2021  ? ?Lab Results  ?Component Value Date  ? ALT 31 01/11/2021  ? AST 28 01/11/2021  ? ALKPHOS 57 01/11/2021  ? BILITOT <0.2 01/11/2021  ? ?Lab Results  ?Component Value Date  ? HGBA1C 6.1 (A) 04/22/2021  ? HGBA1C 7.2 (A) 10/21/2020  ? HGBA1C  10.7 (A) 06/18/2020  ? HGBA1C 10.6 (A) 03/18/2020  ? HGBA1C 7.5 (A) 09/13/2019  ? ?Lab Results  ?Component Value Date  ? INSULIN 36.0 (H) 01/11/2021  ? INSULIN 24.2 04/30/2019  ? ?Lab Results  ?Component Value Date  ? TSH 1.33 09/13/2019  ? ?Lab Results  ?Component Value Date  ? CHOL 166 04/16/2021  ? HDL 52 04/16/2021  ? [place] 85 04/16/2021  ? TRIG 173 (H) 04/16/2021  ? CHOLHDL 3.2 04/16/2021  ? ?Lab Results  ?Component Value Date  ? WBC 8.7 09/13/2019  ? HGB 14.2 09/13/2019  ? HCT 42.7 09/13/2019  ? MCV 90.2 09/13/2019  ? PLT 276.0 09/13/2019  ? ?No results found for: IRON, TIBC, FERRITIN ?Lab Results  ?Component Value Date  ? VD25OH 84.2 01/11/2021  ? VD25OH 25.8 (L) 04/30/2019  ? ? ?Attestation Statements:  ? ?Reviewed by clinician on day of visit: allergies, medications, problem list, medical history, surgical history, family history, social history, and previous encounter notes. ? ?

## 2021-07-27 ENCOUNTER — Telehealth (INDEPENDENT_AMBULATORY_CARE_PROVIDER_SITE_OTHER): Payer: 59 | Admitting: Family Medicine

## 2021-07-27 ENCOUNTER — Encounter (INDEPENDENT_AMBULATORY_CARE_PROVIDER_SITE_OTHER): Payer: Self-pay | Admitting: Family Medicine

## 2021-07-27 ENCOUNTER — Encounter (INDEPENDENT_AMBULATORY_CARE_PROVIDER_SITE_OTHER): Payer: Self-pay

## 2021-07-27 ENCOUNTER — Other Ambulatory Visit (HOSPITAL_COMMUNITY): Payer: Self-pay

## 2021-07-27 VITALS — Ht 68.0 in | Wt 198.4 lb

## 2021-07-27 DIAGNOSIS — Z7985 Long-term (current) use of injectable non-insulin antidiabetic drugs: Secondary | ICD-10-CM

## 2021-07-27 DIAGNOSIS — E669 Obesity, unspecified: Secondary | ICD-10-CM

## 2021-07-27 DIAGNOSIS — Z6828 Body mass index (BMI) 28.0-28.9, adult: Secondary | ICD-10-CM

## 2021-07-27 DIAGNOSIS — E1169 Type 2 diabetes mellitus with other specified complication: Secondary | ICD-10-CM

## 2021-07-27 DIAGNOSIS — G4733 Obstructive sleep apnea (adult) (pediatric): Secondary | ICD-10-CM | POA: Diagnosis not present

## 2021-07-27 MED ORDER — WEGOVY 2.4 MG/0.75ML ~~LOC~~ SOAJ
2.4000 mg | SUBCUTANEOUS | 0 refills | Status: DC
  Filled 2021-07-27: qty 3, 28d supply, fill #0

## 2021-07-28 ENCOUNTER — Other Ambulatory Visit (HOSPITAL_COMMUNITY): Payer: Self-pay

## 2021-08-18 ENCOUNTER — Other Ambulatory Visit (HOSPITAL_COMMUNITY): Payer: Self-pay

## 2021-08-18 ENCOUNTER — Other Ambulatory Visit (INDEPENDENT_AMBULATORY_CARE_PROVIDER_SITE_OTHER): Payer: Self-pay | Admitting: Family Medicine

## 2021-08-18 DIAGNOSIS — E669 Obesity, unspecified: Secondary | ICD-10-CM

## 2021-08-24 ENCOUNTER — Ambulatory Visit (INDEPENDENT_AMBULATORY_CARE_PROVIDER_SITE_OTHER): Payer: 59 | Admitting: Family Medicine

## 2021-08-24 ENCOUNTER — Encounter (INDEPENDENT_AMBULATORY_CARE_PROVIDER_SITE_OTHER): Payer: Self-pay | Admitting: Family Medicine

## 2021-08-24 ENCOUNTER — Other Ambulatory Visit (HOSPITAL_COMMUNITY): Payer: Self-pay

## 2021-08-24 VITALS — BP 100/65 | HR 80 | Temp 97.8°F | Ht 68.0 in | Wt 202.0 lb

## 2021-08-24 DIAGNOSIS — E669 Obesity, unspecified: Secondary | ICD-10-CM

## 2021-08-24 DIAGNOSIS — Z7985 Long-term (current) use of injectable non-insulin antidiabetic drugs: Secondary | ICD-10-CM | POA: Diagnosis not present

## 2021-08-24 DIAGNOSIS — Z6830 Body mass index (BMI) 30.0-30.9, adult: Secondary | ICD-10-CM | POA: Diagnosis not present

## 2021-08-24 DIAGNOSIS — I152 Hypertension secondary to endocrine disorders: Secondary | ICD-10-CM

## 2021-08-24 DIAGNOSIS — E1165 Type 2 diabetes mellitus with hyperglycemia: Secondary | ICD-10-CM | POA: Diagnosis not present

## 2021-08-24 DIAGNOSIS — E1159 Type 2 diabetes mellitus with other circulatory complications: Secondary | ICD-10-CM

## 2021-08-24 MED ORDER — WEGOVY 2.4 MG/0.75ML ~~LOC~~ SOAJ
2.4000 mg | SUBCUTANEOUS | 0 refills | Status: DC
  Filled 2021-08-24: qty 3, 28d supply, fill #0

## 2021-09-04 NOTE — Progress Notes (Signed)
Chief Complaint:   OBESITY Travis Owens is here to discuss his progress with his obesity treatment plan along with follow-up of his obesity related diagnoses. Travis Owens is on the Category 4 Plan and states he is following his eating plan approximately 25% of the time. Travis Owens states he is doing yard work for 100 minutes.  Today's visit was #: 37 Starting weight: 220 lbs Starting date: 04/30/2019 Today's weight: 202 lbs Today's date: 09/04/2021 Total lbs lost to date: 18 Total lbs lost since last in-office visit: 0  Interim History: Travis Owens has been very busy with travel and some of life's commitments. In the next few weeks he does not have much travel plans and does not anticipate much in terms of obstacles.  Travis Owens wants to continue on the Category 4,   Subjective:   1. Type 2 diabetes mellitus with hyperglycemia, without long-term current use of insulin ([name]) Travis Owens is on Badin, Wegovy and Glucophage.  His last [name] was 6.1 (improved from 7.2).  2. Hypertension associated with type 2 diabetes mellitus ([name]) Travis Owens is on Cozaar and Toprol.  Travis Owens denies any chest pain, chest pressure, or headaches.  Assessment/Plan:   1. Type 2 diabetes mellitus with hyperglycemia, without long-term current use of insulin ([place]) Travis Owens has agreed to get labs with PCP in late June 2023.  2. Hypertension associated with type 2 diabetes mellitus ([place]) Travis Owens has agreed to follow up with cardiology in early June for echo and then follow up with [facility].   3. Obesity with current BMI of 30.7 Travis Owens has agreed to continue Wegovy 2.4 mg SQ every week # 60m.  See below.  - Semaglutide-Weight Management (WEGOVY) 2.4 MG/0.75ML SOAJ; Inject 2.4 mg into the skin once a week.  Dispense: 3 mL; Refill: 0  Travis Owens is currently in the action stage of change. As such, his goal is to continue with weight loss efforts. He has agreed to the Category 4 Plan.   Exercise goals: All adults should avoid inactivity. Some physical activity is better  than none, and adults who participate in any amount of physical activity gain some health benefits.  Behavioral modification strategies: increasing lean protein intake, meal planning and cooking strategies, keeping healthy foods in the home, and planning for success.  Travis Owens has agreed to follow-up with our clinic in 4 weeks. He was informed of the importance of frequent follow-up visits to maximize his success with intensive lifestyle modifications for his multiple health conditions.   Objective:   Blood pressure 100/65, pulse 80, temperature 97.8 F (36.6 C), height '5\' 8"'$  (1.727 m), weight 202 lb (91.6 kg), SpO2 97 %. Body mass index is 30.71 kg/m.  General: Cooperative, alert, well developed, in no acute distress. HEENT: Conjunctivae and lids unremarkable. Cardiovascular: Regular rhythm.  Lungs: Normal work of breathing. Neurologic: No focal deficits.   Lab Results  Component Value Date   CREATININE 0.87 04/28/2021   BUN 17 04/28/2021   NA 141 04/28/2021   K 5.0 04/28/2021   CL 100 04/28/2021   CO2 28 04/28/2021   Lab Results  Component Value Date   ALT 31 01/11/2021   AST 28 01/11/2021   ALKPHOS 57 01/11/2021   BILITOT <0.2 01/11/2021   Lab Results  Component Value Date   HGBA1C 6.1 (A) 04/22/2021   HGBA1C 7.2 (A) 10/21/2020   HGBA1C 10.7 (A) 06/18/2020   HGBA1C 10.6 (A) 03/18/2020   HGBA1C 7.5 (A) 09/13/2019   Lab Results  Component Value Date   INSULIN  36.0 (H) 01/11/2021   INSULIN 24.2 04/30/2019   Lab Results  Component Value Date   TSH 1.33 09/13/2019   Lab Results  Component Value Date   CHOL 166 04/16/2021   HDL 52 04/16/2021   LDLCALC 85 04/16/2021   TRIG 173 (H) 04/16/2021   CHOLHDL 3.2 04/16/2021   Lab Results  Component Value Date   VD25OH 84.2 01/11/2021   VD25OH 25.8 (L) 04/30/2019   Lab Results  Component Value Date   WBC 8.7 09/13/2019   HGB 14.2 09/13/2019   HCT 42.7 09/13/2019   MCV 90.2 09/13/2019   PLT 276.0 09/13/2019   No  results found for: IRON, TIBC, FERRITIN  Attestation Statements:   Reviewed by clinician on day of visit: allergies, medications, problem list, medical history, surgical history, family history, social history, and previous encounter notes.  I, Davy Pique, RMA, am acting as transcriptionist for Coralie Common, MD.  I have reviewed the above documentation for accuracy and completeness, and I agree with the above. - Coralie Common, MD

## 2021-09-23 ENCOUNTER — Other Ambulatory Visit (HOSPITAL_COMMUNITY): Payer: Self-pay

## 2021-09-23 ENCOUNTER — Encounter (INDEPENDENT_AMBULATORY_CARE_PROVIDER_SITE_OTHER): Payer: Self-pay | Admitting: Family Medicine

## 2021-09-23 ENCOUNTER — Ambulatory Visit (INDEPENDENT_AMBULATORY_CARE_PROVIDER_SITE_OTHER): Payer: 59 | Admitting: Family Medicine

## 2021-09-23 VITALS — BP 118/75 | HR 82 | Temp 97.7°F | Ht 68.0 in | Wt 205.0 lb

## 2021-09-23 DIAGNOSIS — Z7985 Long-term (current) use of injectable non-insulin antidiabetic drugs: Secondary | ICD-10-CM | POA: Diagnosis not present

## 2021-09-23 DIAGNOSIS — E1165 Type 2 diabetes mellitus with hyperglycemia: Secondary | ICD-10-CM

## 2021-09-23 DIAGNOSIS — E669 Obesity, unspecified: Secondary | ICD-10-CM | POA: Diagnosis not present

## 2021-09-23 DIAGNOSIS — Z9189 Other specified personal risk factors, not elsewhere classified: Secondary | ICD-10-CM

## 2021-09-23 DIAGNOSIS — Z6831 Body mass index (BMI) 31.0-31.9, adult: Secondary | ICD-10-CM | POA: Diagnosis not present

## 2021-09-23 DIAGNOSIS — E785 Hyperlipidemia, unspecified: Secondary | ICD-10-CM

## 2021-09-23 DIAGNOSIS — E1169 Type 2 diabetes mellitus with other specified complication: Secondary | ICD-10-CM | POA: Diagnosis not present

## 2021-09-23 DIAGNOSIS — E1159 Type 2 diabetes mellitus with other circulatory complications: Secondary | ICD-10-CM | POA: Diagnosis not present

## 2021-09-23 DIAGNOSIS — Z7984 Long term (current) use of oral hypoglycemic drugs: Secondary | ICD-10-CM | POA: Diagnosis not present

## 2021-09-23 DIAGNOSIS — I152 Hypertension secondary to endocrine disorders: Secondary | ICD-10-CM | POA: Diagnosis not present

## 2021-09-23 MED ORDER — LOSARTAN POTASSIUM 25 MG PO TABS
25.0000 mg | ORAL_TABLET | Freq: Every day | ORAL | 0 refills | Status: DC
  Filled 2021-09-23: qty 30, 30d supply, fill #0

## 2021-09-23 MED ORDER — METFORMIN HCL 1000 MG PO TABS
1000.0000 mg | ORAL_TABLET | Freq: Two times a day (BID) | ORAL | 0 refills | Status: DC
  Filled 2021-09-23: qty 60, 30d supply, fill #0

## 2021-09-23 MED ORDER — EZETIMIBE 10 MG PO TABS
10.0000 mg | ORAL_TABLET | Freq: Every day | ORAL | 0 refills | Status: DC
  Filled 2021-09-23: qty 30, 30d supply, fill #0

## 2021-09-23 MED ORDER — ATORVASTATIN CALCIUM 80 MG PO TABS
80.0000 mg | ORAL_TABLET | Freq: Every day | ORAL | 0 refills | Status: DC
  Filled 2021-09-23: qty 30, 30d supply, fill #0

## 2021-09-23 MED ORDER — EMPAGLIFLOZIN 10 MG PO TABS
10.0000 mg | ORAL_TABLET | Freq: Every day | ORAL | 0 refills | Status: DC
  Filled 2021-09-23: qty 30, 30d supply, fill #0

## 2021-09-23 MED ORDER — METOPROLOL SUCCINATE ER 200 MG PO TB24
200.0000 mg | ORAL_TABLET | Freq: Every day | ORAL | 0 refills | Status: DC
  Filled 2021-09-23: qty 30, 30d supply, fill #0

## 2021-09-23 MED ORDER — WEGOVY 2.4 MG/0.75ML ~~LOC~~ SOAJ
2.4000 mg | SUBCUTANEOUS | 0 refills | Status: DC
  Filled 2021-09-23: qty 3, 28d supply, fill #0

## 2021-09-29 NOTE — Progress Notes (Signed)
Chief Complaint:   OBESITY Travis Owens is here to discuss his progress with his obesity treatment plan along with follow-up of his obesity related diagnoses. Travis Owens is on the Category 4 Plan and states he is following his eating plan approximately 40% of the time. Travis Owens states he is walking and doing yard work 30-60 minutes 2 times per week.  Today's visit was #: 71 Starting weight: 220 lbs Starting date: 04/30/2019 Today's weight: 205 lbs Today's date: 09/23/2021 Total lbs lost to date: 15 lbs Total lbs lost since last in-office visit: 3  Interim History: Travis Owens has been about 40% compliant on plan over the last few weeks. He has had to do quite a few funerals. He has been able to go to the mountains as well. He also has been walking some and doing quite a bit of exercise. Has gotten in a bit more protein.  Subjective:   1. Hypertension associated with type 2 diabetes mellitus (Travis Owens) Travis Owens's blood pressure is well controlled today. Denies chest pain, chest pressure and headache. He is currently taking Aldactone, Toprol and Cozaar.  2. Type 2 diabetes mellitus with hyperglycemia, without long-term current use of insulin (Travis Owens) Travis Owens is currently taking Jardiance, Metformin and Wegovy. His last [name] at 6.1 in Dec 2022.  3. Hyperlipidemia associated with type 2 diabetes mellitus (Travis Owens) Travis Owens is currently taking Zetia and Statin. His last LDL of 85, HDL of 52 and Trigly of 173.  4. At risk for hyperglycemia Travis Owens has a history of some elevated blood glucose readings without a diagnosis of diabetes due to dietary choices.  Assessment/Plan:   1. Hypertension associated with type 2 diabetes mellitus (Travis Owens) We will refill Metoprolol 200 mg AND Cozaar 25 mg by mouth daily for 1 month with 0 refills.  -Refill metoprolol (TOPROL-XL) 200 MG 24 hr tablet; Take 1 tablet (200 mg total) by mouth daily.  Dispense: 30 tablet; Refill: 0  -Refill losartan (COZAAR) 25 MG tablet; Take 1 tablet (25 mg total) by mouth  daily.  Dispense: 30 tablet; Refill: 0  2. Type 2 diabetes mellitus with hyperglycemia, without long-term current use of insulin (Travis Owens) We will refill Wegovy 2.4 mg/ Metformin 1000 mg / Jardiance 10 mg by mouth for 1 month with 0 refills.  -Refill Semaglutide-Weight Management (WEGOVY) 2.4 MG/0.75ML SOAJ; Inject 2.4 mg into the skin once a week.  Dispense: 3 mL; Refill: 0  -Refill metFORMIN (GLUCOPHAGE) 1000 MG tablet; Take 1 tablet (1,000 mg total) by mouth 2 (two) times daily.  Dispense: 60 tablet; Refill: 0  -Refill empagliflozin (JARDIANCE) 10 MG TABS tablet; Take 1 tablet (10 mg total) by mouth daily before breakfast.  Dispense: 30 tablet; Refill: 0  3. Hyperlipidemia associated with type 2 diabetes mellitus (Travis Owens) We will refill Zetia 10 mg AND Lipitor 80 mg by mouth daily for 1 month with 0 refills.  -Refill ezetimibe (ZETIA) 10 MG tablet; Take 1 tablet (10 mg total) by mouth daily.  Dispense: 30 tablet; Refill: 0 -Refill atorvastatin (LIPITOR) 80 MG tablet; Take 1 tablet (80 mg total) by mouth daily.  Dispense: 30 tablet; Refill: 0  4. At risk for hyperglycemia Travis Owens was given approximately 15 minutes of counseling today regarding prevention of hyperglycemia. He was advised of hyperglycemia causes and the fact hyperglycemia is often asymptomatic. Travis Owens was instructed to avoid skipping meals, eat regular protein rich meals and schedule low calorie but protein rich snacks as needed.   Repetitive spaced learning was employed today to elicit superior memory  formation and behavioral change  5. Obesity with current BMI of 31.3 We will refill Wegovy 2.4 mg subcutaneous once weekly for 1 month with 0 refills.  -Refill Semaglutide-Weight Management (WEGOVY) 2.4 MG/0.75ML SOAJ; Inject 2.4 mg into the skin once a week.  Dispense: 3 mL; Refill: 0  Travis Owens is currently in the action stage of change. As such, his goal is to continue with weight loss efforts. He has agreed to the Category 4 Plan.    Exercise goals: All adults should avoid inactivity. Some physical activity is better than none, and adults who participate in any amount of physical activity gain some health benefits.  Behavioral modification strategies: increasing lean protein intake.  Travis Owens has agreed to follow-up with our clinic in 4 weeks. He was informed of the importance of frequent follow-up visits to maximize his success with intensive lifestyle modifications for his multiple health conditions.   Objective:   Blood pressure 118/75, pulse 82, temperature 97.7 F (36.5 C), height '5\' 8"'$  (1.727 m), weight 205 lb (93 kg), SpO2 97 %. Body mass index is 31.17 kg/m.  General: Cooperative, alert, well developed, in no acute distress. HEENT: Conjunctivae and lids unremarkable. Cardiovascular: Regular rhythm.  Lungs: Normal work of breathing. Neurologic: No focal deficits.   Lab Results  Component Value Date   CREATININE 0.87 04/28/2021   BUN 17 04/28/2021   NA 141 04/28/2021   K 5.0 04/28/2021   CL 100 04/28/2021   CO2 28 04/28/2021   Lab Results  Component Value Date   ALT 31 01/11/2021   AST 28 01/11/2021   ALKPHOS 57 01/11/2021   BILITOT <0.2 01/11/2021   Lab Results  Component Value Date   HGBA1C 6.1 (A) 04/22/2021   HGBA1C 7.2 (A) 10/21/2020   HGBA1C 10.7 (A) 06/18/2020   HGBA1C 10.6 (A) 03/18/2020   HGBA1C 7.5 (A) 09/13/2019   Lab Results  Component Value Date   INSULIN 36.0 (H) 01/11/2021   INSULIN 24.2 04/30/2019   Lab Results  Component Value Date   TSH 1.33 09/13/2019   Lab Results  Component Value Date   CHOL 166 04/16/2021   HDL 52 04/16/2021   LDLCALC 85 04/16/2021   TRIG 173 (H) 04/16/2021   CHOLHDL 3.2 04/16/2021   Lab Results  Component Value Date   VD25OH 84.2 01/11/2021   VD25OH 25.8 (L) 04/30/2019   Lab Results  Component Value Date   WBC 8.7 09/13/2019   HGB 14.2 09/13/2019   HCT 42.7 09/13/2019   MCV 90.2 09/13/2019   PLT 276.0 09/13/2019   No results  found for: IRON, TIBC, FERRITIN  Attestation Statements:   Reviewed by clinician on day of visit: allergies, medications, problem list, medical history, surgical history, family history, social history, and previous encounter notes.  I, Elnora Morrison, RMA am acting as transcriptionist for Coralie Common, MD.  I have reviewed the above documentation for accuracy and completeness, and I agree with the above. - Coralie Common, MD

## 2021-10-01 ENCOUNTER — Ambulatory Visit (HOSPITAL_COMMUNITY): Payer: 59 | Attending: Internal Medicine

## 2021-10-01 DIAGNOSIS — I5042 Chronic combined systolic (congestive) and diastolic (congestive) heart failure: Secondary | ICD-10-CM | POA: Insufficient documentation

## 2021-10-01 LAB — ECHOCARDIOGRAM COMPLETE
Area-P 1/2: 2.97 cm2
S' Lateral: 2.9 cm

## 2021-10-01 MED ORDER — PERFLUTREN LIPID MICROSPHERE
3.0000 mL | INTRAVENOUS | Status: AC | PRN
  Administered 2021-10-01: 3 mL via INTRAVENOUS

## 2021-10-08 DIAGNOSIS — H2513 Age-related nuclear cataract, bilateral: Secondary | ICD-10-CM | POA: Diagnosis not present

## 2021-10-08 DIAGNOSIS — E119 Type 2 diabetes mellitus without complications: Secondary | ICD-10-CM | POA: Diagnosis not present

## 2021-10-08 LAB — HM DIABETES EYE EXAM

## 2021-10-11 ENCOUNTER — Encounter: Payer: Self-pay | Admitting: Family Medicine

## 2021-10-11 NOTE — Progress Notes (Unsigned)
Cardiology Office Note:    Date:  10/12/2021   ID:  Travis Owens, DOB September 30, 1955, MRN [id number]  PCP:  [name], MD  Cardiologist:  Donato Heinz, MD  Electrophysiologist:  None   Referring MD: [name], MD   Chief Complaint  Patient presents with   Congestive Heart Failure     History of Present Illness:    Travis Owens is a 66 y.o. male with a hx of chronic combined systolic and diastolic heart failure, type 2 diabetes, hypertension, hyperlipidemia, OSA who present for follow-up.  He was is referred by Dr. Leafy Ro for evaluation of left bundle branch block on 05/01/18.  He was referred to Dr. Leafy Ro for weight loss evaluation, an EKG was done which showed left bundle branch block.  TTE was done on 05/03/2019 and showed EF 40 to 45%, with marked marked septal-lateral dyssynchrony consistent with LBBB.  Coronary CTA on 06/06/2019 showed nonobstructive CAD (calcified plaque in the RPDA causing mild (25-49%) stenosis and calcified plaque in the proximal LAD and proximal RCA causing minimal (0-24%) stenosis).  Calcium score 111 (62nd percentile for age/gender).  CMR on 07/25/2019 showed EF 45%, no LGE, RV EF 37%.  Echocardiogram 10/01/2021 showed EF 45 to 67%, grade 1 diastolic dysfunction, dyssynchrony due to LBBB, normal RV function, no significant valvular disease.  Since last clinic visit, he reports that he is doing well.  Denies any chest pain, dyspnea, lightheadedness, syncope, lower extremity edema, or palpitations.  Walks 30 minutes 2 times per week, denies any exertional symptoms. Uses Bipap but misses some nights.    BP Readings from Last 3 Encounters:  10/12/21 94/61  09/23/21 118/75  08/24/21 100/65   Wt Readings from Last 3 Encounters:  10/12/21 211 lb 6.4 oz (95.9 kg)  09/23/21 205 lb (93 kg)  08/24/21 202 lb (91.6 kg)    Past Medical History:  Diagnosis Date   Anxiety    Chest pain    Depression    Diabetes mellitus without complication ([place])     Fatty liver    GERD (gastroesophageal reflux disease)    Hyperlipidemia    Hypertension    Sleep apnea    has CPAP/does not use    Past Surgical History:  Procedure Laterality Date   COLONOSCOPY     Dr. Olevia Perches  2008    Current Medications: Current Meds  Medication Sig   acetaminophen (TYLENOL) 325 MG tablet Take 1 tablet by mouth as needed.   Blood Glucose Monitoring Suppl (FREESTYLE LITE) DEVI Check Blood sugar twice daily   Cyanocobalamin (B-12) 1000 MCG CAPS Take 1,000 Int'l Units by mouth daily in the afternoon.   glucose blood (FREESTYLE LITE) test strip Check Blood sugar twice daily   ibuprofen (ADVIL) 200 MG tablet Take 1 tablet by mouth as needed.   Lancets (FREESTYLE) lancets Check blood sugar twice daily   Multiple Vitamin (MULTIVITAMIN) tablet Take 1 tablet by mouth daily.   Semaglutide-Weight Management (WEGOVY) 2.4 MG/0.75ML SOAJ Inject 2.4 mg into the skin once a week.   [DISCONTINUED] atorvastatin (LIPITOR) 80 MG tablet Take 1 tablet (80 mg total) by mouth daily.   [DISCONTINUED] empagliflozin (JARDIANCE) 10 MG TABS tablet Take 1 tablet (10 mg total) by mouth daily before breakfast.   [DISCONTINUED] escitalopram (LEXAPRO) 20 MG tablet Take 1 tablet (20 mg total) by mouth daily.   [DISCONTINUED] ezetimibe (ZETIA) 10 MG tablet Take 1 tablet (10 mg total) by mouth daily.   [DISCONTINUED] losartan (COZAAR)  25 MG tablet Take 1 tablet (25 mg total) by mouth daily.   [DISCONTINUED] metFORMIN (GLUCOPHAGE) 1000 MG tablet Take 1 tablet (1,000 mg total) by mouth 2 (two) times daily.   [DISCONTINUED] metoprolol (TOPROL-XL) 200 MG 24 hr tablet Take 1 tablet (200 mg total) by mouth daily.   [DISCONTINUED] pantoprazole (PROTONIX) 40 MG tablet Take 1 tablet (40 mg total) by mouth daily.   [DISCONTINUED] spironolactone (ALDACTONE) 25 MG tablet Take 0.5 tablets (12.5 mg total) by mouth daily.     Allergies:   Patient has no known allergies.   Social History   Socioeconomic  History   Marital status: Married    Spouse name: Jonathen Rathman   Number of children: Not on file   Years of education: Not on file   Highest education level: Not on file  Occupational History   Occupation: Theme park manager    Comment: [place]  Tobacco Use   Smoking status: Never   Smokeless tobacco: Never  Vaping Use   Vaping Use: Never used  Substance and Sexual Activity   Alcohol use: No   Drug use: No   Sexual activity: Yes  Other Topics Concern   Not on file  Social History Narrative   Not on file   Social Determinants of Health   Financial Resource Strain: Not on file  Food Insecurity: Not on file  Transportation Needs: Not on file  Physical Activity: Not on file  Stress: Not on file  Social Connections: Not on file     Family History: The patient's family history includes Cancer in his father; Hypertension in his paternal grandmother; Stroke in his father and paternal grandfather. There is no history of Colon cancer, Colon polyps, Esophageal cancer, Rectal cancer, or Stomach cancer.  ROS:   Please see the history of present illness.    All other systems reviewed and are negative.  EKGs/Labs/Other Studies Reviewed:    The following studies were reviewed today:   EKG:   10/12/2021: Normal sinus rhythm, left bundle branch block, rate 76 04/16/21: LBBB, NSR, rate 85 07/14/2020: normal sinus rhythm, rate 86, left bundle branch block  TTE 05/03/19:  1. Left ventricular ejection fraction, by visual estimation, is 40 to 45%. The left ventricle has mild to moderately decreased function. There is mildly increased left ventricular hypertrophy. There is marked septal-lateral dyssynchrony consistent with LBBB.  2. Left ventricular diastolic parameters are consistent with Grade I diastolic dysfunction (impaired relaxation).  3. Global right ventricle has normal systolic function.The right ventricular size is normal. No increase in right ventricular wall thickness.  4.  Left atrial size was normal.  5. Right atrial size was normal.  6. The mitral valve is normal in structure. No evidence of mitral valve regurgitation. No evidence of mitral stenosis.  7. The tricuspid valve is normal in structure.  8. The aortic valve is tricuspid. Aortic valve regurgitation is not visualized. Mild aortic valve sclerosis without stenosis  9. TR signal is inadequate for assessing pulmonary artery systolic pressure. 10. The inferior vena cava is normal in size with greater than 50% respiratory variability, suggesting right atrial pressure of 3 mmHg.  Coronary CTA 06/06/19: 1. Coronary calcium score of 111. This was 34 percentile for age and sex matched control.   2. Normal coronary origin with right dominance.   3. Nonobstructive CAD. There is calcified plaque in the RPDA causing mild (25-49%) stenosis and calcified plaque in the proximal LAD and proximal RCA causing minimal (0-24%) stenosis  CAD-RADS 2. Mild non-obstructive CAD (25-49%). Consider non-atherosclerotic causes of chest pain. Consider preventive therapy and risk factor modification.1. Coronary calcium score of 111. This was 101 percentile for age and sex matched control.   2. Normal coronary origin with right dominance.   3. Nonobstructive CAD. There is calcified plaque in the RPDA causing mild (25-49%) stenosis and calcified plaque in the proximal LAD and proximal RCA causing minimal (0-24%) stenosis   CAD-RADS 2. Mild non-obstructive CAD (25-49%). Consider non-atherosclerotic causes of chest pain. Consider preventive therapy and risk factor modification.  Noncardiac: IMPRESSION: No significant noncardiac findings. Incidental 6 mm densely calcified granuloma in the lingula.  CMR 07/25/19: 1. Normal LV size and thickness with markedly abnormal septal motion EF 45%. 2.  Possible LV non compaction seen best in SA/3 chamber views 3. No delayed gadolinium uptake, scar, infiltration, infarct in  LV myocardium 4.  Basal RV dilatation and hypokinesis RVEF 37% 5   normal MV,AV, TV 6.  Normal Aortic root 3.0 cm 7.  Normal T2* 48 msec 8. Unable to calculate T1/ECG failure to acquire adequate T1 map sequence pre contrast      Recent Labs: 01/11/2021: ALT 31 04/16/2021: Magnesium 1.9 04/28/2021: BUN 17; Creatinine, Ser 0.87; Potassium 5.0; Sodium 141  Recent Lipid Panel    Component Value Date/Time   CHOL 166 04/16/2021 0839   TRIG 173 (H) 04/16/2021 0839   HDL 52 04/16/2021 0839   CHOLHDL 3.2 04/16/2021 0839   CHOLHDL 3 09/13/2019 0941   VLDL 33.2 09/13/2019 0941   LDLCALC 85 04/16/2021 0839    Physical Exam:    VS:  BP 94/61 (BP Location: Left Arm, Patient Position: Sitting, Cuff Size: Large)   Pulse 76   Ht 5' 10.5" (1.791 m)   Wt 211 lb 6.4 oz (95.9 kg)   SpO2 96%   BMI 29.90 kg/m     Wt Readings from Last 3 Encounters:  10/12/21 211 lb 6.4 oz (95.9 kg)  09/23/21 205 lb (93 kg)  08/24/21 202 lb (91.6 kg)     GEN:   in no acute distress HEENT: Normal NECK: No JVD CARDIAC:RRR, no murmurs, rubs, gallops RESPIRATORY:  Clear to auscultation without rales, wheezing or rhonchi  ABDOMEN: Soft, non-tender, non-distended MUSCULOSKELETAL:  No edema; No deformity  SKIN: Warm and dry NEUROLOGIC:  Alert and oriented x 3 PSYCHIATRIC:  Normal affect   ASSESSMENT:    1. Chronic combined systolic and diastolic heart failure ([place])   2. Coronary artery disease involving native coronary artery of native heart without angina pectoris   3. Hyperlipidemia associated with type 2 diabetes mellitus ([place])   4. Type 2 diabetes mellitus with hyperglycemia, without long-term current use of insulin ([name])   5. Hypertension associated with type 2 diabetes mellitus ([place])     PLAN:    Chronic combined systolic and diastolic heart failure: EF 40 to 45% on TTE from 05/02/18, with marked septal-lateral dyssynchrony consistent with LBBB.  Coronary CTA showed nonobstructive CAD.  CMR showed  EF 45%, no LGE.  Echocardiogram 10/01/2021 showed EF 45 to 40%, grade 1 diastolic dysfunction, dyssynchrony due to LBBB, normal RV function, no significant valvular disease.  Appears euvolemic.  Suspect systolic dysfunction due to LBBB -Continue losartan 25 mg daily.  Previously was on Entresto but discontinued due to low BP. -Continue Toprol-XL, will decrease dose to 150 mg daily given soft BP -Continue Jardiance 10 mg daily -Continue spironolactone 12.5 mg daily -Check BMET/magnesium  Coronary artery disease: coronary CTA on  06/06/2019 showed nonobstructive CAD (calcified plaque in the RPDA causing mild (25-49%) stenosis and calcified plaque in the proximal LAD and proximal RCA causing minimal (0-24%) stenosis.  Calcium score 111 (62nd percentile for age/gender). -Continue atorvastatin 80 mg daily -Stopped taking aspirin 81 mg, can hold off for now  Hypertension: Continue Toprol-XL, losartan, spironolactone as above  Hyperlipidemia: LDL 85 on 04/16/2021 on atorvastatin 80 mg daily and Zetia 10 mg daily.  Referred to pharmacy lipid clinic and planned to start [place] but he did not follow-up.  He is now interested in starting Repatha, will reestablish with lipid clinic  Type 2 diabetes: [name] 6.1% on 04/22/21.  On metformin, semaglutide, Jardiance  OSA: Sleep study on 05/15/2019 confirmed OSA.  Encourage compliance with BiPAP  RTC in 6 months  Medication Adjustments/Labs and Tests Ordered: Current medicines are reviewed at length with the patient today.  Concerns regarding medicines are outlined above.  Orders Placed This Encounter  Procedures   Basic metabolic panel   Magnesium   AMB Referral to [facility] Pharm-[facility]   EKG 12-Lead    Meds ordered this encounter  Medications   atorvastatin (LIPITOR) 80 MG tablet    Sig: Take 1 tablet (80 mg total) by mouth daily.    Dispense:  90 tablet    Refill:  3   empagliflozin (JARDIANCE) 10 MG TABS tablet    Sig: Take 1 tablet (10 mg total) by  mouth daily before breakfast.    Dispense:  30 tablet    Refill:  3   escitalopram (LEXAPRO) 20 MG tablet    Sig: Take 1 tablet (20 mg total) by mouth daily.    Dispense:  90 tablet    Refill:  1   ezetimibe (ZETIA) 10 MG tablet    Sig: Take 1 tablet (10 mg total) by mouth daily.    Dispense:  30 tablet    Refill:  0   losartan (COZAAR) 25 MG tablet    Sig: Take 1 tablet (25 mg total) by mouth daily.    Dispense:  30 tablet    Refill:  3    D/C ENTRESTO   metFORMIN (GLUCOPHAGE) 1000 MG tablet    Sig: Take 1 tablet (1,000 mg total) by mouth 2 (two) times daily.    Dispense:  60 tablet    Refill:  3   pantoprazole (PROTONIX) 40 MG tablet    Sig: Take 1 tablet (40 mg total) by mouth daily.    Dispense:  90 tablet    Refill:  3   spironolactone (ALDACTONE) 25 MG tablet    Sig: Take 0.5 tablets (12.5 mg total) by mouth daily.    Dispense:  45 tablet    Refill:  3   metoprolol succinate (TOPROL-XL) 100 MG 24 hr tablet    Sig: Take 1.5 tablets (150 mg total) by mouth daily.    Dispense:  45 tablet    Refill:  5    Dose increase     Patient Instructions  Medication Instructions:  DECREASE METOPROLOL SUCCINATE TO '150mg'$  ONCE DAILY   REFILLS PROVIDED   *If you need a refill on your cardiac medications before your next appointment, please call your pharmacy*  Follow-Up: At [facility], you and your health needs are our priority.  As part of our continuing mission to provide you with exceptional heart care, we have created designated Provider Care Teams.  These Care Teams include your primary Cardiologist (physician) and Advanced Practice Providers (APPs -  Physician  Assistants and Nurse Practitioners) who all work together to provide you with the care you need, when you need it.  Your next appointment:   6 month(s)  The format for your next appointment:   In Person  Provider:   Donato Heinz, MD    Other Instructions [place] (CHOLESTEROL MANAGEMENT) AT NEXT AVAILABLE             Signed, Donato Heinz, MD  10/12/2021 6:03 PM    [name]

## 2021-10-12 ENCOUNTER — Encounter: Payer: Self-pay | Admitting: Cardiology

## 2021-10-12 ENCOUNTER — Ambulatory Visit: Payer: 59 | Admitting: Cardiology

## 2021-10-12 ENCOUNTER — Other Ambulatory Visit (HOSPITAL_COMMUNITY): Payer: Self-pay

## 2021-10-12 VITALS — BP 94/61 | HR 76 | Ht 70.5 in | Wt 211.4 lb

## 2021-10-12 DIAGNOSIS — I152 Hypertension secondary to endocrine disorders: Secondary | ICD-10-CM | POA: Diagnosis not present

## 2021-10-12 DIAGNOSIS — I2510 Atherosclerotic heart disease of native coronary artery without angina pectoris: Secondary | ICD-10-CM

## 2021-10-12 DIAGNOSIS — E1159 Type 2 diabetes mellitus with other circulatory complications: Secondary | ICD-10-CM | POA: Diagnosis not present

## 2021-10-12 DIAGNOSIS — E1169 Type 2 diabetes mellitus with other specified complication: Secondary | ICD-10-CM | POA: Diagnosis not present

## 2021-10-12 DIAGNOSIS — E785 Hyperlipidemia, unspecified: Secondary | ICD-10-CM | POA: Diagnosis not present

## 2021-10-12 DIAGNOSIS — E1165 Type 2 diabetes mellitus with hyperglycemia: Secondary | ICD-10-CM | POA: Diagnosis not present

## 2021-10-12 DIAGNOSIS — I5042 Chronic combined systolic (congestive) and diastolic (congestive) heart failure: Secondary | ICD-10-CM

## 2021-10-12 MED ORDER — ATORVASTATIN CALCIUM 80 MG PO TABS
80.0000 mg | ORAL_TABLET | Freq: Every day | ORAL | 3 refills | Status: DC
  Filled 2021-10-12 – 2021-10-14 (×2): qty 90, 90d supply, fill #0
  Filled 2021-12-30: qty 90, 90d supply, fill #1
  Filled 2022-04-21 – 2022-04-22 (×2): qty 90, 90d supply, fill #2

## 2021-10-12 MED ORDER — ESCITALOPRAM OXALATE 20 MG PO TABS
20.0000 mg | ORAL_TABLET | Freq: Every day | ORAL | 1 refills | Status: DC
  Filled 2021-10-12: qty 90, 90d supply, fill #0
  Filled 2021-12-30: qty 90, 90d supply, fill #1

## 2021-10-12 MED ORDER — PANTOPRAZOLE SODIUM 40 MG PO TBEC
40.0000 mg | DELAYED_RELEASE_TABLET | Freq: Every day | ORAL | 3 refills | Status: DC
  Filled 2021-10-12: qty 90, 90d supply, fill #0
  Filled 2021-12-30: qty 90, 90d supply, fill #1
  Filled 2022-04-21 – 2022-04-22 (×3): qty 90, 90d supply, fill #2

## 2021-10-12 MED ORDER — EMPAGLIFLOZIN 10 MG PO TABS
10.0000 mg | ORAL_TABLET | Freq: Every day | ORAL | 3 refills | Status: DC
  Filled 2021-10-12 – 2021-10-19 (×2): qty 30, 30d supply, fill #0

## 2021-10-12 MED ORDER — METFORMIN HCL 1000 MG PO TABS
1000.0000 mg | ORAL_TABLET | Freq: Two times a day (BID) | ORAL | 3 refills | Status: DC
  Filled 2021-10-12 – 2021-10-14 (×2): qty 60, 30d supply, fill #0

## 2021-10-12 MED ORDER — METOPROLOL SUCCINATE ER 100 MG PO TB24
150.0000 mg | ORAL_TABLET | Freq: Every day | ORAL | 5 refills | Status: DC
  Filled 2021-10-12: qty 45, 30d supply, fill #0
  Filled 2021-11-12: qty 45, 30d supply, fill #1
  Filled 2021-12-30: qty 45, 30d supply, fill #2
  Filled 2022-02-08: qty 45, 30d supply, fill #3
  Filled 2022-03-13: qty 45, 30d supply, fill #4
  Filled 2022-04-21 – 2022-04-22 (×2): qty 45, 30d supply, fill #5

## 2021-10-12 MED ORDER — LOSARTAN POTASSIUM 25 MG PO TABS
25.0000 mg | ORAL_TABLET | Freq: Every day | ORAL | 3 refills | Status: DC
  Filled 2021-10-12 – 2021-10-19 (×2): qty 30, 30d supply, fill #0
  Filled 2021-11-12: qty 30, 30d supply, fill #1
  Filled 2021-12-30: qty 30, 30d supply, fill #2
  Filled 2022-02-08: qty 30, 30d supply, fill #3

## 2021-10-12 MED ORDER — SPIRONOLACTONE 25 MG PO TABS
12.5000 mg | ORAL_TABLET | Freq: Every day | ORAL | 3 refills | Status: DC
  Filled 2021-10-12: qty 45, 90d supply, fill #0
  Filled 2021-12-30: qty 45, 90d supply, fill #1
  Filled 2022-04-21 – 2022-04-22 (×2): qty 45, 90d supply, fill #2
  Filled 2022-08-22: qty 45, 90d supply, fill #3

## 2021-10-12 MED ORDER — EZETIMIBE 10 MG PO TABS
10.0000 mg | ORAL_TABLET | Freq: Every day | ORAL | 0 refills | Status: DC
  Filled 2021-10-12 – 2021-10-19 (×2): qty 30, 30d supply, fill #0

## 2021-10-12 NOTE — Patient Instructions (Signed)
Medication Instructions:  DECREASE METOPROLOL SUCCINATE TO '150mg'$  ONCE DAILY   REFILLS PROVIDED   *If you need a refill on your cardiac medications before your next appointment, please call your pharmacy*  Follow-Up: At [facility], you and your health needs are our priority.  As part of our continuing mission to provide you with exceptional heart care, we have created designated Provider Care Teams.  These Care Teams include your primary Cardiologist (physician) and Advanced Practice Providers (APPs -  Physician Assistants and Nurse Practitioners) who all work together to provide you with the care you need, when you need it.  Your next appointment:   6 month(s)  The format for your next appointment:   In Person  Provider:   Donato Heinz, MD    Other Instructions PLEASE SCHEDULE APPOINTMENT WITH [facility] FOR PCSK-[facility] (CHOLESTEROL MANAGEMENT) AT NEXT AVAILABLE

## 2021-10-13 LAB — BASIC METABOLIC PANEL
BUN/Creatinine Ratio: 23 (ref 10–24)
BUN: 19 mg/dL (ref 8–27)
CO2: 24 mmol/L (ref 20–29)
Calcium: 9.8 mg/dL (ref 8.6–10.2)
Chloride: 101 mmol/L (ref 96–106)
Creatinine, Ser: 0.81 mg/dL (ref 0.76–1.27)
Glucose: 101 mg/dL — ABNORMAL HIGH (ref 70–99)
Potassium: 5 mmol/L (ref 3.5–5.2)
Sodium: 141 mmol/L (ref 134–144)
eGFR: 97 mL/min/{1.73_m2} (ref >59)

## 2021-10-13 LAB — MAGNESIUM: Magnesium: 2.1 mg/dL (ref 1.6–2.3)

## 2021-10-14 ENCOUNTER — Telehealth: Payer: Self-pay | Admitting: Pharmacist

## 2021-10-14 ENCOUNTER — Other Ambulatory Visit (HOSPITAL_COMMUNITY): Payer: Self-pay

## 2021-10-14 ENCOUNTER — Encounter: Payer: Self-pay | Admitting: Pharmacist

## 2021-10-14 ENCOUNTER — Ambulatory Visit (INDEPENDENT_AMBULATORY_CARE_PROVIDER_SITE_OTHER): Payer: 59 | Admitting: Pharmacist

## 2021-10-14 VITALS — Wt 211.8 lb

## 2021-10-14 DIAGNOSIS — E1169 Type 2 diabetes mellitus with other specified complication: Secondary | ICD-10-CM | POA: Diagnosis not present

## 2021-10-14 DIAGNOSIS — I2510 Atherosclerotic heart disease of native coronary artery without angina pectoris: Secondary | ICD-10-CM

## 2021-10-14 DIAGNOSIS — E785 Hyperlipidemia, unspecified: Secondary | ICD-10-CM | POA: Diagnosis not present

## 2021-10-14 MED ORDER — REPATHA SURECLICK 140 MG/ML ~~LOC~~ SOAJ
1.0000 mL | SUBCUTANEOUS | 3 refills | Status: DC
  Filled 2021-10-14 (×2): qty 6, 84d supply, fill #0
  Filled 2021-12-30: qty 6, 84d supply, fill #1
  Filled 2022-04-22 (×2): qty 6, 84d supply, fill #2
  Filled 2022-07-28: qty 6, 84d supply, fill #3

## 2021-10-14 NOTE — Patient Instructions (Addendum)
It was nice meeting you today  We would like your LDL (bad cholesterol) to be less than 55  Please continue your atorvastatin '80mg'$  once a day and your Zetia '10mg'$  daily  We will start a new medication called either Repatha or Praluent which you will inject once every 2 weeks  I will complete the prior authorization for you and contact you when it is approved  Once you start the new medication we will recheck your cholesterol in about 2-3 months  Please call us with any questions  Karren Cobble, PharmD, [place], [place], [place], [place], [address] Phone: [phone], Fax: [phone]

## 2021-10-14 NOTE — Telephone Encounter (Signed)
Completed PA for Repatha, [place] [id number].  Plan responded that no PA needed at this time. Sent Rx to pharmacy, instructed patient to download copay card, and placed lab order for 2-3 months.

## 2021-10-15 ENCOUNTER — Ambulatory Visit: Payer: 59 | Admitting: Family Medicine

## 2021-10-18 ENCOUNTER — Encounter: Payer: Self-pay | Admitting: *Deleted

## 2021-10-18 ENCOUNTER — Other Ambulatory Visit (HOSPITAL_COMMUNITY): Payer: Self-pay

## 2021-10-19 ENCOUNTER — Other Ambulatory Visit (HOSPITAL_COMMUNITY): Payer: Self-pay

## 2021-10-21 ENCOUNTER — Ambulatory Visit: Payer: 59 | Admitting: Family Medicine

## 2021-10-25 ENCOUNTER — Ambulatory Visit (INDEPENDENT_AMBULATORY_CARE_PROVIDER_SITE_OTHER): Payer: Medicare Other | Admitting: Family Medicine

## 2021-11-03 ENCOUNTER — Encounter (INDEPENDENT_AMBULATORY_CARE_PROVIDER_SITE_OTHER): Payer: Self-pay | Admitting: Family Medicine

## 2021-11-03 ENCOUNTER — Ambulatory Visit (INDEPENDENT_AMBULATORY_CARE_PROVIDER_SITE_OTHER): Payer: 59 | Admitting: Family Medicine

## 2021-11-03 ENCOUNTER — Other Ambulatory Visit (HOSPITAL_COMMUNITY): Payer: Self-pay

## 2021-11-03 VITALS — BP 106/70 | HR 75 | Temp 97.9°F | Ht 69.0 in | Wt 203.0 lb

## 2021-11-03 DIAGNOSIS — Z6830 Body mass index (BMI) 30.0-30.9, adult: Secondary | ICD-10-CM | POA: Diagnosis not present

## 2021-11-03 DIAGNOSIS — E669 Obesity, unspecified: Secondary | ICD-10-CM | POA: Diagnosis not present

## 2021-11-03 DIAGNOSIS — Z7985 Long-term (current) use of injectable non-insulin antidiabetic drugs: Secondary | ICD-10-CM | POA: Diagnosis not present

## 2021-11-03 DIAGNOSIS — E1169 Type 2 diabetes mellitus with other specified complication: Secondary | ICD-10-CM | POA: Diagnosis not present

## 2021-11-03 DIAGNOSIS — Z7984 Long term (current) use of oral hypoglycemic drugs: Secondary | ICD-10-CM

## 2021-11-03 MED ORDER — WEGOVY 2.4 MG/0.75ML ~~LOC~~ SOAJ
2.4000 mg | SUBCUTANEOUS | 0 refills | Status: DC
  Filled 2021-11-03: qty 3, 28d supply, fill #0

## 2021-11-04 ENCOUNTER — Encounter: Payer: Self-pay | Admitting: Family Medicine

## 2021-11-04 ENCOUNTER — Other Ambulatory Visit (HOSPITAL_COMMUNITY): Payer: Self-pay

## 2021-11-04 ENCOUNTER — Ambulatory Visit: Payer: 59 | Admitting: Family Medicine

## 2021-11-04 ENCOUNTER — Ambulatory Visit (INDEPENDENT_AMBULATORY_CARE_PROVIDER_SITE_OTHER): Payer: Medicare Other | Admitting: Family Medicine

## 2021-11-04 VITALS — BP 100/62 | HR 76 | Temp 97.9°F | Ht 69.0 in | Wt 210.8 lb

## 2021-11-04 DIAGNOSIS — E1169 Type 2 diabetes mellitus with other specified complication: Secondary | ICD-10-CM | POA: Diagnosis not present

## 2021-11-04 DIAGNOSIS — I152 Hypertension secondary to endocrine disorders: Secondary | ICD-10-CM | POA: Diagnosis not present

## 2021-11-04 DIAGNOSIS — H1131 Conjunctival hemorrhage, right eye: Secondary | ICD-10-CM

## 2021-11-04 DIAGNOSIS — E1159 Type 2 diabetes mellitus with other circulatory complications: Secondary | ICD-10-CM

## 2021-11-04 DIAGNOSIS — E785 Hyperlipidemia, unspecified: Secondary | ICD-10-CM | POA: Diagnosis not present

## 2021-11-04 DIAGNOSIS — E1165 Type 2 diabetes mellitus with hyperglycemia: Secondary | ICD-10-CM

## 2021-11-04 LAB — POCT GLYCOSYLATED HEMOGLOBIN (HGB A1C): Hemoglobin A1C: 6.7 % — AB (ref 4.0–5.6)

## 2021-11-04 MED ORDER — EZETIMIBE 10 MG PO TABS
10.0000 mg | ORAL_TABLET | Freq: Every day | ORAL | 3 refills | Status: DC
  Filled 2021-11-04 – 2021-11-12 (×2): qty 90, 90d supply, fill #0
  Filled 2022-02-08: qty 90, 90d supply, fill #1

## 2021-11-04 MED ORDER — EMPAGLIFLOZIN 10 MG PO TABS
10.0000 mg | ORAL_TABLET | Freq: Every day | ORAL | 3 refills | Status: DC
  Filled 2021-11-04 – 2021-11-12 (×2): qty 90, 90d supply, fill #0
  Filled 2022-04-21 – 2022-04-22 (×2): qty 90, 90d supply, fill #1

## 2021-11-04 MED ORDER — METFORMIN HCL 1000 MG PO TABS
1000.0000 mg | ORAL_TABLET | Freq: Two times a day (BID) | ORAL | 3 refills | Status: DC
  Filled 2021-11-04 – 2021-11-09 (×2): qty 180, 90d supply, fill #0
  Filled 2022-02-08: qty 180, 90d supply, fill #1

## 2021-11-04 NOTE — Assessment & Plan Note (Signed)
[name] stable 6.7 [name] stable 6.7.  He is working on lifestyle modifications.  We will continue current regimen metformin 1000 mg twice daily, Jardiance 10 mg daily, and Wegovy 2.4 mg weekly.  Follow-up in 6 months to recheck [name].

## 2021-11-04 NOTE — Assessment & Plan Note (Signed)
At goal today on current regimen spironolactone 12.5 mg daily, losartan 25 mg daily, and metoprolol succinate 150 mg daily.

## 2021-11-04 NOTE — Progress Notes (Signed)
   Travis Owens is a 66 y.o. male who presents today for an office visit.  Assessment/Plan:  New/Acute Problems: Subconjunctival hemorrhage No red flags.  Reassured patient.  Chronic Problems Addressed Today: Type 2 diabetes mellitus with hyperglycemia, without long-term current use of insulin ([name]) [name] stable 6.7 [name] stable 6.7.  He is working on lifestyle modifications.  We will continue current regimen metformin 1000 mg twice daily, Jardiance 10 mg daily, and Wegovy 2.4 mg weekly.  Follow-up in 6 months to recheck [name].  Hypertension associated with diabetes ([place]) At goal today on current regimen spironolactone 12.5 mg daily, losartan 25 mg daily, and metoprolol succinate 150 mg daily.     Subjective:  HPI:  See A/p for status of chronic conditions.  He has noticed some redness of his eyes since this morning.  No obvious injuries or precipitating events.  No pain.  No vision changes.       Objective:  Physical Exam: BP 100/62   Pulse 76   Temp 97.9 F (36.6 C) (Temporal)   Ht '5\' 9"'$  (1.753 m)   Wt 210 lb 12.8 oz (95.6 kg)   SpO2 97%   BMI 31.13 kg/m   Wt Readings from Last 3 Encounters:  11/04/21 210 lb 12.8 oz (95.6 kg)  11/03/21 203 lb (92.1 kg)  10/14/21 211 lb 12.8 oz (96.1 kg)    Gen: No acute distress, resting comfortably HEENT: Subconjunctival hemorrhage on right lateral eye CV: Regular rate and rhythm with no murmurs appreciated Pulm: Normal work of breathing, clear to auscultation bilaterally with no crackles, wheezes, or rhonchi Neuro: Grossly normal, moves all extremities Psych: Normal affect and thought content      Elior Robinette M. [name], MD 11/04/2021 2:50 PM

## 2021-11-04 NOTE — Patient Instructions (Signed)
It was very nice to see you today!  Your [name] today is well controlled.  I will refill your medications.  We will see you back in 6 months for your annual physical with labs.  Please come back sooner if needed.  Take care, Dr [name]  PLEASE NOTE:  If you had any lab tests please let us know if you have not heard back within a few days. You may see your results on mychart before we have a chance to review them but we will give you a call once they are reviewed by [place]. If we ordered any referrals today, please let us know if you have not heard from their office within the next week.   Please try these tips to maintain a healthy lifestyle:  Eat at least 3 REAL meals and 1-2 snacks per day.  Aim for no more than 5 hours between eating.  If you eat breakfast, please do so within one hour of getting up.   Each meal should contain half fruits/vegetables, one quarter protein, and one quarter carbs (no bigger than a computer mouse)  Cut down on sweet beverages. This includes juice, soda, and sweet tea.   Drink at least 1 glass of water with each meal and aim for at least 8 glasses per day  Exercise at least 150 minutes every week.

## 2021-11-08 NOTE — Progress Notes (Signed)
Chief Complaint:   OBESITY Travis Owens is here to discuss his progress with his obesity treatment plan along with follow-up of his obesity related diagnoses. Travis Owens is on the Category 4 Plan and states he is following his eating plan approximately 25% of the time. Travis Owens states he is doing 0 minutes 0 times per week.  Today's visit was #: 45 Starting weight: 220 lbs Starting date: 04/30/2019 Today's weight: 203 lbs Today's date: 11/03/2021 Total lbs lost to date: 17 Total lbs lost since last in-office visit: 2  Interim History: Travis Owens continues to do well with weight loss on his plan.  He was on vacation and he gained some weight, but he is already losing it.  He is active with yard work and he is trying to increase walking.  His hunger is controlled.  Subjective:   1. Type 2 diabetes mellitus with other specified complication, unspecified whether long term insulin use ([name]) Travis Owens is doing well with his diet and weight loss.  He is on Jardiance, metformin, and GLP-1.  Assessment/Plan:   1. Type 2 diabetes mellitus with other specified complication, unspecified whether long term insulin use ([name]) Travis Owens will continue with his diet, exercise, and weight loss.  We will continue to manage his GLP-1 medications.  2. Obesity, Current BMI 30.1 Travis Owens is currently in the action stage of change. As such, his goal is to continue with weight loss efforts. He has agreed to the Category 4 Plan.   We discussed various medication options to help Travis Owens with his weight loss efforts and we both agreed to continue Wegovy, and we will refill for 1 month..  - Semaglutide-Weight Management (WEGOVY) 2.4 MG/0.75ML SOAJ; Inject 2.4 mg into the skin once a week.  Dispense: 3 mL; Refill: 0  Exercise goals: As is.   Behavioral modification strategies: increasing water intake and no skipping meals.  Morrill has agreed to follow-up with our clinic in 4 weeks. He was informed of the importance of frequent follow-up visits to  maximize his success with intensive lifestyle modifications for his multiple health conditions.   Objective:   Blood pressure 106/70, pulse 75, temperature 97.9 F (36.6 C), height '5\' 9"'$  (1.753 m), weight 203 lb (92.1 kg), SpO2 96 %. Body mass index is 29.98 kg/m.  General: Cooperative, alert, well developed, in no acute distress. HEENT: Conjunctivae and lids unremarkable. Cardiovascular: Regular rhythm.  Lungs: Normal work of breathing. Neurologic: No focal deficits.   Lab Results  Component Value Date   CREATININE 0.81 10/12/2021   BUN 19 10/12/2021   NA 141 10/12/2021   K 5.0 10/12/2021   CL 101 10/12/2021   CO2 24 10/12/2021   Lab Results  Component Value Date   ALT 31 01/11/2021   AST 28 01/11/2021   ALKPHOS 57 01/11/2021   BILITOT <0.2 01/11/2021   Lab Results  Component Value Date   HGBA1C 6.7 (A) 11/04/2021   HGBA1C 6.1 (A) 04/22/2021   HGBA1C 7.2 (A) 10/21/2020   HGBA1C 10.7 (A) 06/18/2020   HGBA1C 10.6 (A) 03/18/2020   Lab Results  Component Value Date   INSULIN 36.0 (H) 01/11/2021   INSULIN 24.2 04/30/2019   Lab Results  Component Value Date   TSH 1.33 09/13/2019   Lab Results  Component Value Date   CHOL 166 04/16/2021   HDL 52 04/16/2021   LDLCALC 85 04/16/2021   TRIG 173 (H) 04/16/2021   CHOLHDL 3.2 04/16/2021   Lab Results  Component Value Date  VD25OH 84.2 01/11/2021   VD25OH 25.8 (L) 04/30/2019   Lab Results  Component Value Date   WBC 8.7 09/13/2019   HGB 14.2 09/13/2019   HCT 42.7 09/13/2019   MCV 90.2 09/13/2019   PLT 276.0 09/13/2019   No results found for: "IRON", "TIBC", "FERRITIN"  Attestation Statements:   Reviewed by clinician on day of visit: allergies, medications, problem list, medical history, surgical history, family history, social history, and previous encounter notes.   I, Travis Owens, am acting as transcriptionist for Dennard Nip, MD.  I have reviewed the above documentation for accuracy and  completeness, and I agree with the above. -  Dennard Nip, MD

## 2021-11-09 ENCOUNTER — Other Ambulatory Visit (HOSPITAL_COMMUNITY): Payer: Self-pay

## 2021-11-10 ENCOUNTER — Other Ambulatory Visit (HOSPITAL_COMMUNITY): Payer: Self-pay

## 2021-11-11 ENCOUNTER — Other Ambulatory Visit (HOSPITAL_COMMUNITY): Payer: Self-pay

## 2021-11-12 ENCOUNTER — Other Ambulatory Visit (HOSPITAL_COMMUNITY): Payer: Self-pay

## 2021-12-01 ENCOUNTER — Encounter (INDEPENDENT_AMBULATORY_CARE_PROVIDER_SITE_OTHER): Payer: Self-pay | Admitting: Family Medicine

## 2021-12-01 ENCOUNTER — Encounter (INDEPENDENT_AMBULATORY_CARE_PROVIDER_SITE_OTHER): Payer: Self-pay

## 2021-12-01 ENCOUNTER — Ambulatory Visit (INDEPENDENT_AMBULATORY_CARE_PROVIDER_SITE_OTHER): Payer: 59 | Admitting: Family Medicine

## 2021-12-01 ENCOUNTER — Other Ambulatory Visit (HOSPITAL_COMMUNITY): Payer: Self-pay

## 2021-12-01 ENCOUNTER — Other Ambulatory Visit (INDEPENDENT_AMBULATORY_CARE_PROVIDER_SITE_OTHER): Payer: Self-pay | Admitting: Family Medicine

## 2021-12-01 ENCOUNTER — Telehealth (INDEPENDENT_AMBULATORY_CARE_PROVIDER_SITE_OTHER): Payer: Self-pay | Admitting: Family Medicine

## 2021-12-01 VITALS — BP 113/60 | HR 80 | Temp 97.6°F | Ht 69.0 in | Wt 200.0 lb

## 2021-12-01 DIAGNOSIS — Z7984 Long term (current) use of oral hypoglycemic drugs: Secondary | ICD-10-CM

## 2021-12-01 DIAGNOSIS — Z6829 Body mass index (BMI) 29.0-29.9, adult: Secondary | ICD-10-CM

## 2021-12-01 DIAGNOSIS — Z7985 Long-term (current) use of injectable non-insulin antidiabetic drugs: Secondary | ICD-10-CM | POA: Diagnosis not present

## 2021-12-01 DIAGNOSIS — E669 Obesity, unspecified: Secondary | ICD-10-CM

## 2021-12-01 DIAGNOSIS — Z6830 Body mass index (BMI) 30.0-30.9, adult: Secondary | ICD-10-CM

## 2021-12-01 DIAGNOSIS — E1169 Type 2 diabetes mellitus with other specified complication: Secondary | ICD-10-CM | POA: Diagnosis not present

## 2021-12-01 MED ORDER — WEGOVY 2.4 MG/0.75ML ~~LOC~~ SOAJ
2.4000 mg | SUBCUTANEOUS | 0 refills | Status: DC
  Filled 2021-12-01: qty 3, 28d supply, fill #0

## 2021-12-01 NOTE — Telephone Encounter (Signed)
Patient's wife called to state that the pharmacy needed his prescription for [facility]. He was here today (8/9) for a prescription.

## 2021-12-01 NOTE — Telephone Encounter (Signed)
Wife called to state that her husband's Travis Owens was not called into the pharmacy. He was seen today 8/9.

## 2021-12-02 ENCOUNTER — Other Ambulatory Visit (HOSPITAL_COMMUNITY): Payer: Self-pay

## 2021-12-02 DIAGNOSIS — E1169 Type 2 diabetes mellitus with other specified complication: Secondary | ICD-10-CM | POA: Insufficient documentation

## 2021-12-02 NOTE — Telephone Encounter (Signed)
Pt aware that prescription is ready to pick up at the pharmacy.

## 2021-12-04 IMAGING — MR MR CARD MORPHOLOGY WO/W CM
46 of 48 series · 46 of 48 positions shown · IV contrast (gadavist)
Comparison: none

CLINICAL DATA: Cardiomyopathy

EXAM:
CARDIAC MRI
TECHNIQUE: The patient was scanned on a 1.5 Tesla Siemens magnet. A dedicated
cardiac coil was used. Functional imaging was done using Fiesta
sequences. [DATE], and 4 chamber views were done to assess for RWMA's.
Modified Maryama Mahamud rule using a short axis stack was used to
calculate an ejection fraction on a dedicated work station using
Circle software. The patient received 12 cc of Gadavist after 10
minutes inversion recovery sequences were used to assess for
infiltration and scar tissue.
CONTRAST:  Gadavist

[Series 7: bSSFP · oblique · 8.0mm · 1.61mm/px · 1 of 25 slices shown (1 of 21)]
[im 1/25]
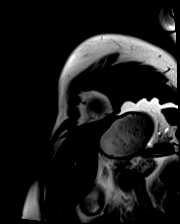

[Series 7: bSSFP · oblique · 8.0mm · 1.61mm/px · 1 of 25 slices shown (2 of 21)]
[im 1/25]
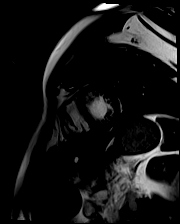

[Series 7: bSSFP · oblique · 8.0mm · 1.61mm/px · 1 of 25 slices shown (3 of 21)]
[im 1/25]
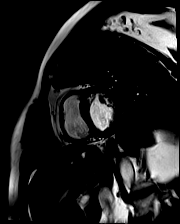

[Series 7: bSSFP · oblique · 8.0mm · 1.61mm/px · 1 of 25 slices shown (4 of 21)]
[im 1/25]
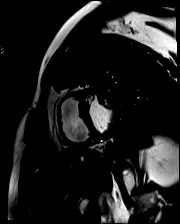

[Series 7: bSSFP · oblique · 8.0mm · 1.61mm/px · 1 of 25 slices shown (5 of 21)]
[im 1/25]
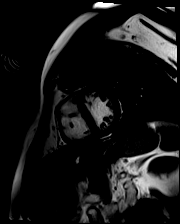

[Series 7: bSSFP · oblique · 8.0mm · 1.61mm/px · 1 of 25 slices shown (6 of 21)]
[im 1/25]
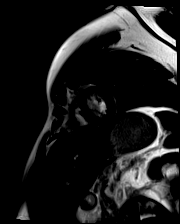

[Series 7: bSSFP · oblique · 8.0mm · 1.61mm/px · 1 of 25 slices shown (7 of 21)]
[im 1/25]
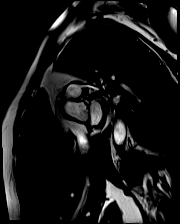

[Series 7: bSSFP · oblique · 8.0mm · 1.61mm/px · 1 of 25 slices shown (8 of 21)]
[im 1/25]
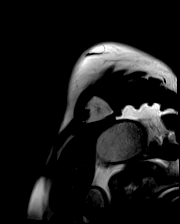

[Series 7: bSSFP · oblique · 8.0mm · 1.61mm/px · 1 of 25 slices shown (9 of 21)]
[im 1/25]
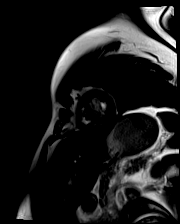

[Series 7: bSSFP · oblique · 8.0mm · 1.61mm/px · 1 of 25 slices shown (10 of 21)]
[im 1/25]
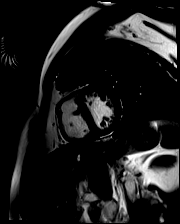

[Series 7: bSSFP · oblique · 8.0mm · 1.61mm/px · 1 of 25 slices shown (11 of 21)]
[im 1/25]
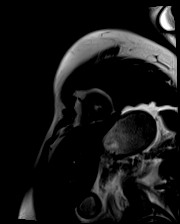

[Series 7: bSSFP · oblique · 8.0mm · 1.61mm/px · 1 of 25 slices shown (12 of 21)]
[im 1/25]
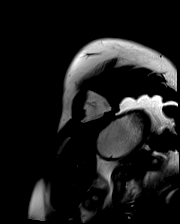

[Series 7: bSSFP · oblique · 8.0mm · 1.61mm/px · 1 of 25 slices shown (13 of 21)]
[im 1/25]
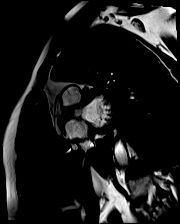

[Series 7: bSSFP · oblique · 8.0mm · 1.61mm/px · 1 of 25 slices shown (14 of 21)]
[im 1/25]
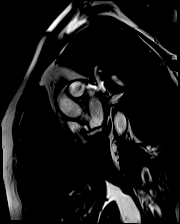

[Series 7: bSSFP · oblique · 8.0mm · 1.61mm/px · 1 of 25 slices shown (15 of 21)]
[im 1/25]
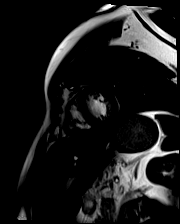

[Series 7: bSSFP · oblique · 8.0mm · 1.61mm/px · 1 of 25 slices shown (16 of 21)]
[im 1/25]
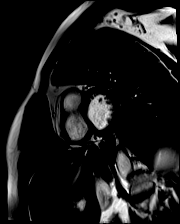

[Series 7: bSSFP · oblique · 8.0mm · 1.61mm/px · 1 of 25 slices shown (17 of 21)]
[im 1/25]
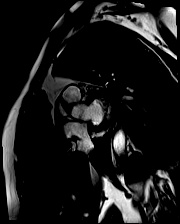

[Series 8: (id)_long_t1 · oblique · 8.0mm · 1.48mm/px · 1 of 24 slices shown]
[im 1/24]
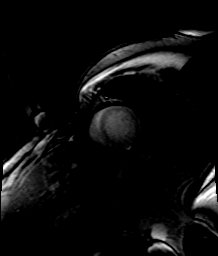

[Series 9: (id)_long_t1_moco · oblique · 8.0mm · 1.48mm/px · 1 of 24 slices shown]
[im 1/24]
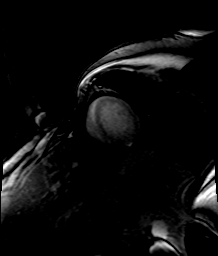

[Series 10: (id)_long_t1_moco_t1 · 1 of 3 slices shown (1 of 2)]
[im 1/3]
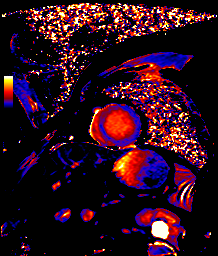

[Series 10: (id)_long_t1_moco_t1 · oblique · 8.0mm · 1.48mm/px · 1 of 3 slices shown (2 of 2)]
[im 1/3]
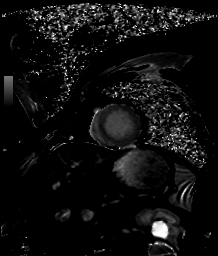

[Series 12: (id)_trufi · oblique · 8.0mm · 1.88mm/px · 1 of 9 slices shown]
[im 1/9]
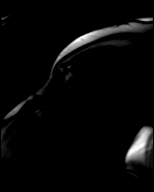

[Series 13: (id)_trufi_moco · oblique · 8.0mm · 1.88mm/px · 1 of 9 slices shown]
[im 1/9]
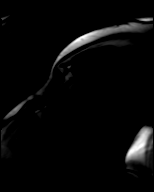

[Series 14: (id)_trufi_moco_t2 · oblique · 8.0mm · 1.88mm/px · 1 of 3 slices shown]
[im 1/3]
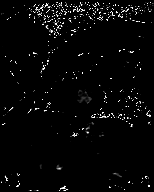

[Series 18: t2_stir_db_radial ((date)ch) · axial · 6.0mm · 1.83mm/px · 1 of 2 slices shown]
[im 1/2]
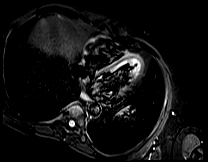

[Series 19: bSSFP · axial · 6.0mm · 1.48mm/px · 1 of 25 slices shown (18 of 21)]
[im 1/25]
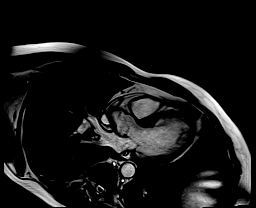

[Series 20: bSSFP · axial · 6.0mm · 1.48mm/px · 1 of 25 slices shown (19 of 21)]
[im 1/25]
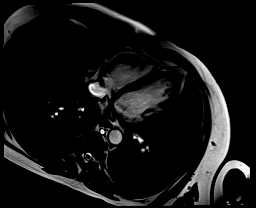

[Series 21: bSSFP · coronal · 6.0mm · 1.48mm/px · 1 of 25 slices shown (20 of 21)]
[im 1/25]
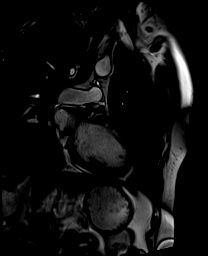

[Series 22: bSSFP · axial · 6.0mm · 1.48mm/px · 1 of 25 slices shown (21 of 21)]
[im 1/25]
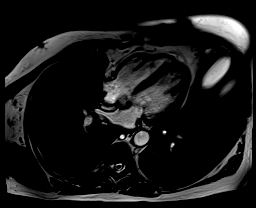

[Series 24: lge_single shot sa · oblique · 8.0mm · 1.98mm/px · 1 of 17 slices shown (1 of 2)]
[im 1/17]
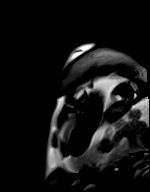

[Series 25: lge_single shot sa · oblique · 8.0mm · 1.98mm/px · 1 of 17 slices shown (2 of 2)]
[im 1/17]
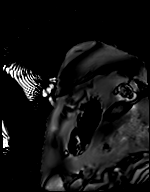

[Series 38: (id)_short_t1 · axial · 8.0mm · 1.41mm/px · 1 of 27 slices shown (1 of 2)]
[im 1/27]
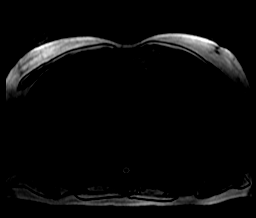

[Series 39: (id)_short_t1_moco · axial · 8.0mm · 1.41mm/px · 1 of 27 slices shown (1 of 2)]
[im 1/27]
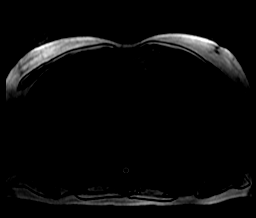

[Series 40: (id)_short_t1_moco_t1 · axial · 8.0mm · 1.41mm/px · 1 of 3 slices shown (1 of 4)]
[im 1/3]
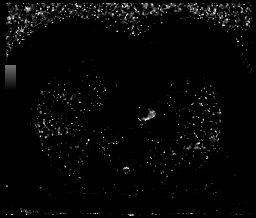

[Series 40: (id)_short_t1_moco_t1 · 1 of 3 slices shown (2 of 4)]
[im 1/3]
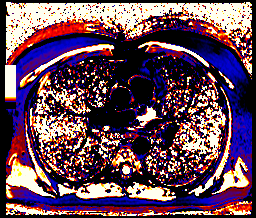

[Series 43: (id)_short_t1 · oblique · 8.0mm · 1.41mm/px · 1 of 27 slices shown (2 of 2)]
[im 1/27]
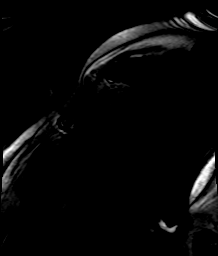

[Series 44: (id)_short_t1_moco · oblique · 8.0mm · 1.41mm/px · 1 of 27 slices shown (2 of 2)]
[im 1/27]
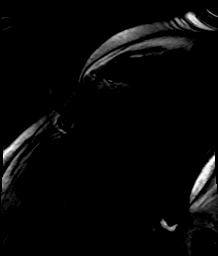

[Series 45: (id)_short_t1_moco_t1 · oblique · 8.0mm · 1.41mm/px · 1 of 3 slices shown (3 of 4)]
[im 1/3]
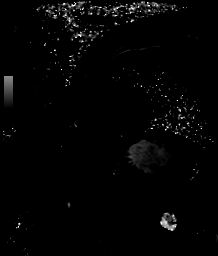

[Series 45: (id)_short_t1_moco_t1 · 1 of 3 slices shown (4 of 4)]
[im 1/3]
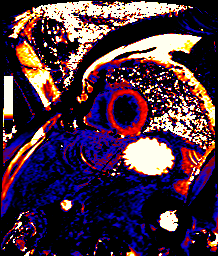

[Series 47: lge short axis_mag · oblique · 6.0mm · 1.61mm/px · 1 of 16 slices shown]
[im 1/16]
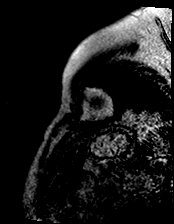

[Series 48: lge short axis_psir · oblique · 6.0mm · 1.61mm/px · 1 of 16 slices shown]
[im 1/16]
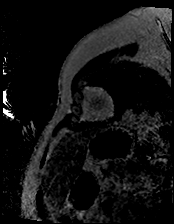

[Series 49: lge radial ((date)ch)_mag · axial · 6.0mm · 1.61mm/px · 1 of 1 slices shown (1 of 3)]
[im 1/1]
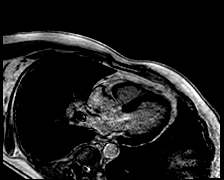

[Series 50: lge radial ((date)ch)_psir · axial · 6.0mm · 1.61mm/px · 1 of 1 slices shown (1 of 2)]
[im 1/1]
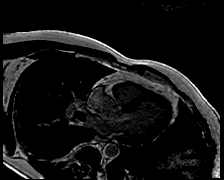

[Series 51: lge radial ((date)ch)_mag · axial · 6.0mm · 1.61mm/px · 1 of 1 slices shown (2 of 3)]
[im 1/1]
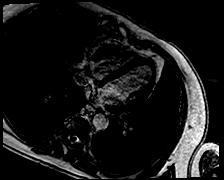

[Series 52: lge radial ((date)ch)_psir · axial · 6.0mm · 1.61mm/px · 1 of 1 slices shown (2 of 2)]
[im 1/1]
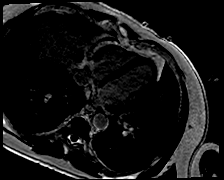

[Series 55: lge radial ((date)ch)_mag · axial · 6.0mm · 1.61mm/px · 1 of 1 slices shown (3 of 3)]
[im 1/1]
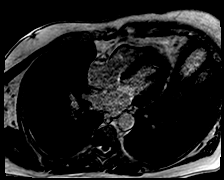

[46 of 48 positions shown; findings below may reference images not displayed]

FINDINGS: Normal atrial size. No ASD/PFO. Normal aortic root 3.0 cm. No
pericardial effusion. Normal mitral aortic and tricuspid valves
There is hypokinesis and dilatation of the basal RV. The
quantitative RVEF was 37% (EDV 119 cc ESV 75 cc SV 44 cc) The LV was
normal in size with markedly abnormal septal motion. Septal
thickness was normal 11 mm . There was evidence of ventricular non
compaction in 3 chamber and short axis imaging
trabeculation/diastolic myocardial thickness was >2. The
quantitative LVEF was 45% (EDV 140 cc ESV 76 cc SV 64 cc) There was
no significant delayed enhancement with gadolinium T2* was normal at
48 msec. T1/ECG could not be calculated as the pre/post contrast
images were not registered.
IMPRESSION: 1. Normal LV size and thickness with markedly abnormal septal motion
EF 45%.

2.  Possible LV non compaction seen best in [REDACTED] chamber views

3. No delayed gadolinium uptake, scar, infiltration, infarct in LV
myocardium

4.  Basal RV dilatation and hypokinesis RVEF 37%

5   normal MV,AV, TV

6.  Normal Aortic root 3.0 cm

7.  Normal T2* 48 msec

8. Unable to calculate T1/ECG failure to acquire adequate T1 map
sequence pre contrast

HAU LIN

## 2021-12-08 NOTE — Progress Notes (Signed)
Chief Complaint:   OBESITY Travis Owens is here to discuss his progress with his obesity treatment plan along with follow-up of his obesity related diagnoses. Harveer is on the Category 4 Plan and states he is following his eating plan approximately 25% of the time. Travis Owens states he is walking for 30 minutes 1 time per week.  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 He has increased activity and has done better with increasing protein. He is doing well on Wegovy. His muscle mass is still below goal however.   Subjective:   1. Type 2 diabetes mellitus with other specified complication, unspecified whether long term insulin use ([name]) Madden is on metformin and Jardiance. He is also on a GLP-1. His recent [name] had increased to 6.7 from 6.1.  Assessment/Plan:   1. Type 2 diabetes mellitus with other specified complication, unspecified whether long term insulin use ([name]) [name] will continue with her diet and weight loss. Her goal is to decrease her [name] to below 6.5.  2. Obesity, Current BMI 29.6 [name] is currently in the action stage of change. As such, his goal is to continue with weight loss efforts. He has agreed to the Category 4 Plan.   We discussed various medication options to help Travis Owens with his weight loss efforts and we both agreed to continue Wegovy 2.4 mg once weekly for 1 month.  - Semaglutide-Weight Management (WEGOVY) 2.4 MG/0.75ML SOAJ; Inject 2.4 mg into the skin once a week.  Dispense: 3 mL; Refill: 0  Exercise goals: As is, increase strengthening exercise.   Behavioral modification strategies: increasing lean protein intake and meal planning and cooking strategies.  Travis Owens has agreed to follow-up with our clinic in 3 to 4 weeks. He was informed of the  importance of frequent follow-up visits to maximize his success with intensive lifestyle modifications for his multiple health conditions.   Objective:   Blood pressure 113/60, pulse 80, temperature 97.6 F (36.4 C), height '5\' 9"'$  (1.753 m), weight 200 lb (90.7 kg), SpO2 100 %. Body mass index is 29.53 kg/m.  General: Cooperative, alert, well developed, in no acute distress. HEENT: Conjunctivae and lids unremarkable. Cardiovascular: Regular rhythm.  Lungs: Normal work of breathing. Neurologic: No focal deficits.   Lab Results  Component Value Date   CREATININE 0.81 10/12/2021   BUN 19 10/12/2021   NA 141 10/12/2021   K 5.0 10/12/2021   CL 101 10/12/2021   CO2 24 10/12/2021   Lab Results  Component Value Date   ALT 31 01/11/2021   AST 28 01/11/2021   ALKPHOS 57 01/11/2021   BILITOT <0.2 01/11/2021   Lab Results  Component Value Date   HGBA1C 6.7 (A) 11/04/2021   HGBA1C 6.1 (A) 04/22/2021   HGBA1C 7.2 (A) 10/21/2020   HGBA1C 10.7 (A) 06/18/2020   HGBA1C 10.6 (A) 03/18/2020   Lab Results  Component Value Date   INSULIN 36.0 (H) 01/11/2021   INSULIN 24.2 04/30/2019   Lab Results  Component Value Date   TSH 1.33 09/13/2019   Lab Results  Component Value Date   CHOL 166 04/16/2021   HDL 52 04/16/2021   LDLCALC 85 04/16/2021   TRIG 173 (H) 04/16/2021   CHOLHDL 3.2 04/16/2021   Lab Results  Component Value Date  VD25OH 84.2 01/11/2021   VD25OH 25.8 (L) 04/30/2019   Lab Results  Component Value Date   WBC 8.7 09/13/2019   HGB 14.2 09/13/2019   HCT 42.7 09/13/2019   MCV 90.2 09/13/2019   PLT 276.0 09/13/2019   No results found for: "IRON", "TIBC", "FERRITIN"  Attestation Statements:   Reviewed by clinician on day of visit: allergies, medications, problem list, medical history, surgical history, family history, social history, and previous encounter notes.   I, Trixie Dredge, am acting as transcriptionist for Dennard Nip, MD.  I have reviewed the  above documentation for accuracy and completeness, and I agree with the above. -  Dennard Nip, MD

## 2021-12-21 ENCOUNTER — Other Ambulatory Visit (HOSPITAL_COMMUNITY): Payer: Self-pay

## 2021-12-21 ENCOUNTER — Other Ambulatory Visit (INDEPENDENT_AMBULATORY_CARE_PROVIDER_SITE_OTHER): Payer: Self-pay | Admitting: Family Medicine

## 2021-12-21 ENCOUNTER — Ambulatory Visit (INDEPENDENT_AMBULATORY_CARE_PROVIDER_SITE_OTHER): Payer: 59 | Admitting: Family Medicine

## 2021-12-21 VITALS — BP 101/69 | HR 80 | Temp 98.0°F | Ht 69.0 in | Wt 202.0 lb

## 2021-12-21 DIAGNOSIS — E669 Obesity, unspecified: Secondary | ICD-10-CM | POA: Diagnosis not present

## 2021-12-21 DIAGNOSIS — I10 Essential (primary) hypertension: Secondary | ICD-10-CM | POA: Insufficient documentation

## 2021-12-21 DIAGNOSIS — Z6829 Body mass index (BMI) 29.0-29.9, adult: Secondary | ICD-10-CM | POA: Diagnosis not present

## 2021-12-21 MED ORDER — WEGOVY 2.4 MG/0.75ML ~~LOC~~ SOAJ
2.4000 mg | SUBCUTANEOUS | 0 refills | Status: DC
  Filled 2021-12-21 – 2021-12-30 (×2): qty 3, 28d supply, fill #0

## 2021-12-22 ENCOUNTER — Ambulatory Visit (INDEPENDENT_AMBULATORY_CARE_PROVIDER_SITE_OTHER): Payer: Medicare Other | Admitting: Family Medicine

## 2021-12-28 NOTE — Progress Notes (Signed)
Chief Complaint:   OBESITY Travis Owens is here to discuss his progress with his obesity treatment plan along with follow-up of his obesity related diagnoses. Travis Owens is on the Category 4 Plan and states he is following his eating plan approximately 25% of the time. Travis Owens states he is working in the yard for 60+ minutes 3 times per week.  Today's visit was #: 3 Starting weight: 220 lbs Starting date: 04/30/2019 Today's weight: 202 lbs Today's date: 12/21/2021 Total lbs lost to date: 18 Total lbs lost since last in-office visit: 0  Interim History: Travis Owens did a bit more eating out and traveling since his last visit. He is doing more yard work and Architect.   Subjective:   1. Essential hypertension Travis Owens had his metoprolol decreased from 200 mg to 150 mg and he remains well controlled with no hypotensive symptoms.  Assessment/Plan:   1. Essential hypertension Travis Owens will continue with her diet and exercise, and we will recheck his blood pressure in 3 weeks.   2. Obesity, Current BMI 29.9 Travis Owens is currently in the action stage of change. As such, his goal is to continue with weight loss efforts. He has agreed to the Category 4 Plan.   We discussed various medication options to help Travis Owens with his weight loss efforts and we both agreed to continue Wegovy 2.4 mg once weekly and we will refill for 1 month.  - Semaglutide-Weight Management (WEGOVY) 2.4 MG/0.75ML SOAJ; Inject 2.4 mg into the skin once a week.  Dispense: 3 mL; Refill: 0  Exercise goals: As is.   Behavioral modification strategies: increasing lean protein intake and meal planning and cooking strategies.  Travis Owens has agreed to follow-up with our clinic in 3 to 4 weeks. He was informed of the importance of frequent follow-up visits to maximize his success with intensive lifestyle modifications for his multiple health conditions.   Objective:   Blood pressure 101/69, pulse 80, temperature 98 F (36.7 C), height '5\' 9"'$  (1.753 m),  weight 202 lb (91.6 kg), SpO2 100 %. Body mass index is 29.83 kg/m.  General: Cooperative, alert, well developed, in no acute distress. HEENT: Conjunctivae and lids unremarkable. Cardiovascular: Regular rhythm.  Lungs: Normal work of breathing. Neurologic: No focal deficits.   Lab Results  Component Value Date   CREATININE 0.81 10/12/2021   BUN 19 10/12/2021   NA 141 10/12/2021   K 5.0 10/12/2021   CL 101 10/12/2021   CO2 24 10/12/2021   Lab Results  Component Value Date   ALT 31 01/11/2021   AST 28 01/11/2021   ALKPHOS 57 01/11/2021   BILITOT <0.2 01/11/2021   Lab Results  Component Value Date   HGBA1C 6.7 (A) 11/04/2021   HGBA1C 6.1 (A) 04/22/2021   HGBA1C 7.2 (A) 10/21/2020   HGBA1C 10.7 (A) 06/18/2020   HGBA1C 10.6 (A) 03/18/2020   Lab Results  Component Value Date   INSULIN 36.0 (H) 01/11/2021   INSULIN 24.2 04/30/2019   Lab Results  Component Value Date   TSH 1.33 09/13/2019   Lab Results  Component Value Date   CHOL 166 04/16/2021   HDL 52 04/16/2021   LDLCALC 85 04/16/2021   TRIG 173 (H) 04/16/2021   CHOLHDL 3.2 04/16/2021   Lab Results  Component Value Date   VD25OH 84.2 01/11/2021   VD25OH 25.8 (L) 04/30/2019   Lab Results  Component Value Date   WBC 8.7 09/13/2019   HGB 14.2 09/13/2019   HCT 42.7 09/13/2019   MCV  90.2 09/13/2019   PLT 276.0 09/13/2019   No results found for: "IRON", "TIBC", "FERRITIN"  Attestation Statements:   Reviewed by clinician on day of visit: allergies, medications, problem list, medical history, surgical history, family history, social history, and previous encounter notes.   I, Trixie Dredge, am acting as transcriptionist for Dennard Nip, MD.  I have reviewed the above documentation for accuracy and completeness, and I agree with the above. -  Dennard Nip, MD

## 2021-12-30 ENCOUNTER — Encounter (INDEPENDENT_AMBULATORY_CARE_PROVIDER_SITE_OTHER): Payer: Self-pay | Admitting: Family Medicine

## 2021-12-30 ENCOUNTER — Other Ambulatory Visit (HOSPITAL_COMMUNITY): Payer: Self-pay

## 2022-01-17 ENCOUNTER — Encounter: Payer: Self-pay | Admitting: *Deleted

## 2022-01-20 ENCOUNTER — Other Ambulatory Visit (HOSPITAL_COMMUNITY): Payer: Self-pay

## 2022-01-20 ENCOUNTER — Encounter (INDEPENDENT_AMBULATORY_CARE_PROVIDER_SITE_OTHER): Payer: Self-pay | Admitting: Family Medicine

## 2022-01-20 ENCOUNTER — Ambulatory Visit (INDEPENDENT_AMBULATORY_CARE_PROVIDER_SITE_OTHER): Payer: 59 | Admitting: Family Medicine

## 2022-01-20 VITALS — BP 103/60 | HR 93 | Temp 98.0°F | Ht 69.0 in | Wt 204.0 lb

## 2022-01-20 DIAGNOSIS — Z6830 Body mass index (BMI) 30.0-30.9, adult: Secondary | ICD-10-CM | POA: Diagnosis not present

## 2022-01-20 DIAGNOSIS — E860 Dehydration: Secondary | ICD-10-CM | POA: Diagnosis not present

## 2022-01-20 DIAGNOSIS — E669 Obesity, unspecified: Secondary | ICD-10-CM

## 2022-01-20 DIAGNOSIS — E1169 Type 2 diabetes mellitus with other specified complication: Secondary | ICD-10-CM | POA: Diagnosis not present

## 2022-01-20 DIAGNOSIS — Z7985 Long-term (current) use of injectable non-insulin antidiabetic drugs: Secondary | ICD-10-CM | POA: Diagnosis not present

## 2022-01-20 MED ORDER — WEGOVY 2.4 MG/0.75ML ~~LOC~~ SOAJ
2.4000 mg | SUBCUTANEOUS | 0 refills | Status: DC
  Filled 2022-01-20 – 2022-01-21 (×2): qty 3, 28d supply, fill #0

## 2022-01-21 ENCOUNTER — Other Ambulatory Visit (HOSPITAL_COMMUNITY): Payer: Self-pay

## 2022-01-25 NOTE — Progress Notes (Signed)
Chief Complaint:   OBESITY Travis Owens is here to discuss his progress with his obesity treatment plan along with follow-up of his obesity related diagnoses. [name] is on the Category 4 Plan and states he is following his eating plan approximately 25% of the time. [name] states he is active while doing yard work.   Today's visit was #: 22 Starting weight: 220 lbs Starting date: 04/30/2019 Today's weight: 204 lbs Today's date: 01/20/2022 Total lbs lost to date: 16 Total lbs lost since last in-office visit: 0  Interim History: Travis Owens continues to work on his weight loss, but she has deviated from his plan more. He is more active in the yard.   Subjective:   1. Type 2 diabetes mellitus with other specified complication, unspecified whether long term insulin use ([name]) Travis Owens is on a GLP-1 and other medications.   2. Dehydration Travis Owens is on spironolactone and Jardiance. He notes feeling lightheaded with position changes.   Assessment/Plan:   1. Type 2 diabetes mellitus with other specified complication, unspecified whether long term insulin use ([name]) Travis Owens will continue his metformin and Jardiance, and he will get back to his diet.   2. Dehydration Travis Owens will work on increasing his water intake, and will continue to monitor.   3. Obesity, Current BMI 30.1 Travis Owens is currently in the action stage of change. As such, his goal is to continue with weight loss efforts. He has agreed to the Category 4 Plan.   We discussed various medication options to help Travis Owens with his weight loss efforts and we both agreed to continue Wegovy 2.4 mg once weekly, and we will refill for 1 month.  - Semaglutide-Weight Management (WEGOVY) 2.4 MG/0.75ML SOAJ; Inject 2.4 mg into the skin once a week.  Dispense: 3 mL; Refill: 0  Exercise goals: As is.   Behavioral modification strategies: increasing lean protein intake and meal planning and cooking strategies.  Travis Owens has agreed to follow-up with our clinic in 3 to 4 weeks.  He was informed of the importance of frequent follow-up visits to maximize his success with intensive lifestyle modifications for his multiple health conditions.   Objective:   Blood pressure 103/60, pulse 93, temperature 98 F (36.7 C), height '5\' 9"'$  (1.753 m), weight 204 lb (92.5 kg), SpO2 96 %. Body mass index is 30.13 kg/m.  General: Cooperative, alert, well developed, in no acute distress. HEENT: Conjunctivae and lids unremarkable. Cardiovascular: Regular rhythm.  Lungs: Normal work of breathing. Neurologic: No focal deficits.   Lab Results  Component Value Date   CREATININE 0.81 10/12/2021   BUN 19 10/12/2021   NA 141 10/12/2021   K 5.0 10/12/2021   CL 101 10/12/2021   CO2 24 10/12/2021   Lab Results  Component Value Date   ALT 31 01/11/2021   AST 28 01/11/2021   ALKPHOS 57 01/11/2021   BILITOT <0.2 01/11/2021   Lab Results  Component Value Date   HGBA1C 6.7 (A) 11/04/2021   HGBA1C 6.1 (A) 04/22/2021   HGBA1C 7.2 (A) 10/21/2020   HGBA1C 10.7 (A) 06/18/2020   HGBA1C 10.6 (A) 03/18/2020   Lab Results  Component Value Date   INSULIN 36.0 (H) 01/11/2021   INSULIN 24.2 04/30/2019   Lab Results  Component Value Date   TSH 1.33 09/13/2019   Lab Results  Component Value Date   CHOL 166 04/16/2021   HDL 52 04/16/2021   LDLCALC 85 04/16/2021   TRIG 173 (H) 04/16/2021   CHOLHDL 3.2 04/16/2021  Lab Results  Component Value Date   VD25OH 84.2 01/11/2021   VD25OH 25.8 (L) 04/30/2019   Lab Results  Component Value Date   WBC 8.7 09/13/2019   HGB 14.2 09/13/2019   HCT 42.7 09/13/2019   MCV 90.2 09/13/2019   PLT 276.0 09/13/2019   No results found for: "IRON", "TIBC", "FERRITIN"  Attestation Statements:   Reviewed by clinician on day of visit: allergies, medications, problem list, medical history, surgical history, family history, social history, and previous encounter notes.   I, Trixie Dredge, am acting as transcriptionist for Dennard Nip, MD.  I  have reviewed the above documentation for accuracy and completeness, and I agree with the above. -  Dennard Nip, MD

## 2022-02-08 ENCOUNTER — Other Ambulatory Visit (HOSPITAL_COMMUNITY): Payer: Self-pay

## 2022-02-14 ENCOUNTER — Ambulatory Visit (INDEPENDENT_AMBULATORY_CARE_PROVIDER_SITE_OTHER): Payer: 59 | Admitting: Family Medicine

## 2022-02-14 ENCOUNTER — Other Ambulatory Visit (HOSPITAL_COMMUNITY): Payer: Self-pay

## 2022-02-14 ENCOUNTER — Encounter (INDEPENDENT_AMBULATORY_CARE_PROVIDER_SITE_OTHER): Payer: Self-pay | Admitting: Family Medicine

## 2022-02-14 VITALS — BP 115/68 | HR 80 | Temp 97.5°F | Ht 69.0 in | Wt 210.0 lb

## 2022-02-14 DIAGNOSIS — Z6831 Body mass index (BMI) 31.0-31.9, adult: Secondary | ICD-10-CM

## 2022-02-14 DIAGNOSIS — I10 Essential (primary) hypertension: Secondary | ICD-10-CM | POA: Diagnosis not present

## 2022-02-14 DIAGNOSIS — R42 Dizziness and giddiness: Secondary | ICD-10-CM | POA: Insufficient documentation

## 2022-02-14 DIAGNOSIS — E669 Obesity, unspecified: Secondary | ICD-10-CM | POA: Diagnosis not present

## 2022-02-14 MED ORDER — WEGOVY 2.4 MG/0.75ML ~~LOC~~ SOAJ
2.4000 mg | SUBCUTANEOUS | 0 refills | Status: DC
  Filled 2022-02-14 – 2022-02-21 (×2): qty 3, 28d supply, fill #0

## 2022-02-16 ENCOUNTER — Encounter (INDEPENDENT_AMBULATORY_CARE_PROVIDER_SITE_OTHER): Payer: Self-pay

## 2022-02-16 ENCOUNTER — Telehealth (INDEPENDENT_AMBULATORY_CARE_PROVIDER_SITE_OTHER): Payer: Self-pay | Admitting: Family Medicine

## 2022-02-16 NOTE — Telephone Encounter (Signed)
Dr. Leafy Ro - Prior authorization approved for [facility]. Effective: 02/15/2022 to 09/06/2022. Patient sent approval message via mychart.

## 2022-02-17 ENCOUNTER — Ambulatory Visit (INDEPENDENT_AMBULATORY_CARE_PROVIDER_SITE_OTHER): Payer: Medicare Other | Admitting: Family Medicine

## 2022-02-20 NOTE — Progress Notes (Unsigned)
Chief Complaint:   OBESITY Travis Owens is here to discuss his progress with his obesity treatment plan along with follow-up of his obesity related diagnoses. Travis Owens is on the Category 4 Plan and states he is following his eating plan approximately 25% of the time. Travis Owens states he is walking for 30 minutes 4 times per week.  Today's visit was #: 65 Starting weight: 220 lbs Starting date: 04/30/2019 Today's weight: 210 lbs Today's date: 02/14/2022 Total lbs lost to date: 10 Total lbs lost since last in-office visit: 0  Interim History: Travis Owens has struggled to follow his eating plan over the last month.  He has done more grab and go eating.  He has increased his walking and he is working on getting back to meal planning.  Subjective:   1. Lightheaded Travis Owens has tried to increase his water intake, and he notes symptoms have improved. He denies recent falls.   2. Essential hypertension Travis Owens's blood pressure is well controlled, and he is working on his diet and exercise.  He has no problems with his medications.  Assessment/Plan:   1. Lightheaded Travis Owens will continue to increase his water intake, and we will continue to work on his diet and weight loss.  2. Essential hypertension Travis Owens we will continue with his medications, diet, exercise, and weight loss.  3. Obesity, Current BMI 31.0 Travis Owens is currently in the action stage of change. As such, his goal is to continue with weight loss efforts. He has agreed to the Category 4 Plan.   We discussed various medication options to help Travis Owens with his weight loss efforts and we both agreed to continue Wegovy 2.4 mg once weekly, and we will refill for 1 month.  - Semaglutide-Weight Management (WEGOVY) 2.4 MG/0.75ML SOAJ; Inject 2.4 mg into the skin once a week.  Dispense: 3 mL; Refill: 0  Exercise goals: As is.   Behavioral modification strategies: increasing lean protein intake and meal planning and cooking strategies.  Travis Owens has agreed to follow-up  with our clinic in 3 to 4 weeks. He was informed of the importance of frequent follow-up visits to maximize his success with intensive lifestyle modifications for his multiple health conditions.   Objective:   Blood pressure 115/68, pulse 80, temperature (!) 97.5 F (36.4 C), height '5\' 9"'$  (1.753 m), weight 210 lb (95.3 kg), SpO2 97 %. Body mass index is 31.01 kg/m.  General: Cooperative, alert, well developed, in no acute distress. HEENT: Conjunctivae and lids unremarkable. Cardiovascular: Regular rhythm.  Lungs: Normal work of breathing. Neurologic: No focal deficits.   Lab Results  Component Value Date   CREATININE 0.81 10/12/2021   BUN 19 10/12/2021   NA 141 10/12/2021   K 5.0 10/12/2021   CL 101 10/12/2021   CO2 24 10/12/2021   Lab Results  Component Value Date   ALT 31 01/11/2021   AST 28 01/11/2021   ALKPHOS 57 01/11/2021   BILITOT <0.2 01/11/2021   Lab Results  Component Value Date   HGBA1C 6.7 (A) 11/04/2021   HGBA1C 6.1 (A) 04/22/2021   HGBA1C 7.2 (A) 10/21/2020   HGBA1C 10.7 (A) 06/18/2020   HGBA1C 10.6 (A) 03/18/2020   Lab Results  Component Value Date   INSULIN 36.0 (H) 01/11/2021   INSULIN 24.2 04/30/2019   Lab Results  Component Value Date   TSH 1.33 09/13/2019   Lab Results  Component Value Date   CHOL 166 04/16/2021   HDL 52 04/16/2021   LDLCALC 85 04/16/2021  TRIG 173 (H) 04/16/2021   CHOLHDL 3.2 04/16/2021   Lab Results  Component Value Date   VD25OH 84.2 01/11/2021   VD25OH 25.8 (L) 04/30/2019   Lab Results  Component Value Date   WBC 8.7 09/13/2019   HGB 14.2 09/13/2019   HCT 42.7 09/13/2019   MCV 90.2 09/13/2019   PLT 276.0 09/13/2019   No results found for: "IRON", "TIBC", "FERRITIN"  Attestation Statements:   Reviewed by clinician on day of visit: allergies, medications, problem list, medical history, surgical history, family history, social history, and previous encounter notes.   I, Trixie Dredge, am acting as  transcriptionist for Dennard Nip, MD.  I have reviewed the above documentation for accuracy and completeness, and I agree with the above. -  Dennard Nip, MD

## 2022-02-21 ENCOUNTER — Other Ambulatory Visit (HOSPITAL_COMMUNITY): Payer: Self-pay

## 2022-02-22 ENCOUNTER — Encounter: Payer: Self-pay | Admitting: Gastroenterology

## 2022-03-13 ENCOUNTER — Other Ambulatory Visit: Payer: Self-pay | Admitting: Cardiology

## 2022-03-13 DIAGNOSIS — I152 Hypertension secondary to endocrine disorders: Secondary | ICD-10-CM

## 2022-03-14 ENCOUNTER — Encounter (INDEPENDENT_AMBULATORY_CARE_PROVIDER_SITE_OTHER): Payer: Self-pay | Admitting: Family Medicine

## 2022-03-14 ENCOUNTER — Ambulatory Visit (INDEPENDENT_AMBULATORY_CARE_PROVIDER_SITE_OTHER): Payer: Medicare Other | Admitting: Family Medicine

## 2022-03-14 ENCOUNTER — Other Ambulatory Visit (HOSPITAL_COMMUNITY): Payer: Self-pay

## 2022-03-14 MED ORDER — LOSARTAN POTASSIUM 25 MG PO TABS
25.0000 mg | ORAL_TABLET | Freq: Every day | ORAL | 3 refills | Status: DC
  Filled 2022-03-14: qty 30, 30d supply, fill #0
  Filled 2022-04-21 – 2022-04-22 (×3): qty 30, 30d supply, fill #1

## 2022-03-15 ENCOUNTER — Other Ambulatory Visit (INDEPENDENT_AMBULATORY_CARE_PROVIDER_SITE_OTHER): Payer: Self-pay | Admitting: Family Medicine

## 2022-03-15 ENCOUNTER — Other Ambulatory Visit (HOSPITAL_COMMUNITY): Payer: Self-pay

## 2022-03-15 DIAGNOSIS — E669 Obesity, unspecified: Secondary | ICD-10-CM

## 2022-03-16 ENCOUNTER — Other Ambulatory Visit (HOSPITAL_COMMUNITY): Payer: Self-pay

## 2022-03-21 ENCOUNTER — Other Ambulatory Visit (INDEPENDENT_AMBULATORY_CARE_PROVIDER_SITE_OTHER): Payer: Self-pay | Admitting: Family Medicine

## 2022-03-21 ENCOUNTER — Other Ambulatory Visit (HOSPITAL_COMMUNITY): Payer: Self-pay

## 2022-03-21 DIAGNOSIS — E669 Obesity, unspecified: Secondary | ICD-10-CM

## 2022-03-22 ENCOUNTER — Ambulatory Visit (INDEPENDENT_AMBULATORY_CARE_PROVIDER_SITE_OTHER): Payer: 59 | Admitting: Family Medicine

## 2022-03-22 ENCOUNTER — Other Ambulatory Visit (HOSPITAL_COMMUNITY): Payer: Self-pay

## 2022-03-22 ENCOUNTER — Encounter (INDEPENDENT_AMBULATORY_CARE_PROVIDER_SITE_OTHER): Payer: Self-pay | Admitting: Family Medicine

## 2022-03-22 VITALS — BP 118/77 | HR 81 | Temp 98.0°F | Ht 69.0 in | Wt 205.6 lb

## 2022-03-22 DIAGNOSIS — Z7984 Long term (current) use of oral hypoglycemic drugs: Secondary | ICD-10-CM

## 2022-03-22 DIAGNOSIS — E669 Obesity, unspecified: Secondary | ICD-10-CM | POA: Diagnosis not present

## 2022-03-22 DIAGNOSIS — Z6830 Body mass index (BMI) 30.0-30.9, adult: Secondary | ICD-10-CM | POA: Diagnosis not present

## 2022-03-22 DIAGNOSIS — E1165 Type 2 diabetes mellitus with hyperglycemia: Secondary | ICD-10-CM | POA: Diagnosis not present

## 2022-03-22 DIAGNOSIS — I10 Essential (primary) hypertension: Secondary | ICD-10-CM | POA: Diagnosis not present

## 2022-03-22 MED ORDER — WEGOVY 2.4 MG/0.75ML ~~LOC~~ SOAJ
2.4000 mg | SUBCUTANEOUS | 0 refills | Status: DC
  Filled 2022-03-22: qty 3, 28d supply, fill #0

## 2022-03-30 NOTE — Progress Notes (Signed)
Chief Complaint:   OBESITY Travis Owens is here to discuss his progress with his obesity treatment plan along with follow-up of his obesity related diagnoses. Travis Owens is on the Category 4 Plan and states he is following his eating plan approximately 25% of the time. Travis Owens states he is walking for 30 minutes 4 times per week.  Today's visit was #: 11 Starting weight: 220 lbs Starting date: 04/30/2019 Today's weight: 205 lbs Today's date: 03/22/2022 Total lbs lost to date: 15 Total lbs lost since last in-office visit: 5  Interim History: Travis Owens continues to do well with his weight loss even over Thanksgiving.  He has mostly been portion controlling.  He is working on getting back on track with his category 4 meal plan.  Subjective:   1. Type 2 diabetes mellitus with hyperglycemia, without long-term current use of insulin ([name]) Travis Owens is on Jardiance and metformin, and GLP-1.  His last [name] was well controlled, with no hypoglycemia noted.  2. Essential hypertension Travis Owens's blood pressure is well controlled with his medications and improving with weight loss.  He has no signs of hypotension.  Assessment/Plan:   1. Type 2 diabetes mellitus with hyperglycemia, without long-term current use of insulin ([name]) Travis Owens will continue with his diet and medications as is, and we will watch for signs when it is time to decrease medications.  2. Essential hypertension Travis Owens is to continue to work on his diet, exercise, and weight loss and we will continue to follow.  3. Obesity, Current BMI 30.4 Travis Owens is currently in the action stage of change. As such, his goal is to continue with weight loss efforts. He has agreed to the Category 4 Plan.   Travis Owens will continue Wegovy 2.4 mg once weekly, and we will refill for 1 month/   - Semaglutide-Weight Management (WEGOVY) 2.4 MG/0.75ML SOAJ; Inject 2.4 mg into the skin once a week.  Dispense: 3 mL; Refill: 0  Exercise goals: As is.   Behavioral modification strategies:  increasing lean protein intake.  Less has agreed to follow-up with our clinic in 5 weeks. He was informed of the importance of frequent follow-up visits to maximize his success with intensive lifestyle modifications for his multiple health conditions.   Objective:   Blood pressure 118/77, pulse 81, temperature 98 F (36.7 C), height '5\' 9"'$  (1.753 m), weight 205 lb 9.6 oz (93.3 kg), SpO2 94 %. Body mass index is 30.36 kg/m.  General: Cooperative, alert, well developed, in no acute distress. HEENT: Conjunctivae and lids unremarkable. Cardiovascular: Regular rhythm.  Lungs: Normal work of breathing. Neurologic: No focal deficits.   Lab Results  Component Value Date   CREATININE 0.81 10/12/2021   BUN 19 10/12/2021   NA 141 10/12/2021   K 5.0 10/12/2021   CL 101 10/12/2021   CO2 24 10/12/2021   Lab Results  Component Value Date   ALT 31 01/11/2021   AST 28 01/11/2021   ALKPHOS 57 01/11/2021   BILITOT <0.2 01/11/2021   Lab Results  Component Value Date   HGBA1C 6.7 (A) 11/04/2021   HGBA1C 6.1 (A) 04/22/2021   HGBA1C 7.2 (A) 10/21/2020   HGBA1C 10.7 (A) 06/18/2020   HGBA1C 10.6 (A) 03/18/2020   Lab Results  Component Value Date   INSULIN 36.0 (H) 01/11/2021   INSULIN 24.2 04/30/2019   Lab Results  Component Value Date   TSH 1.33 09/13/2019   Lab Results  Component Value Date   CHOL 166 04/16/2021   HDL 52 04/16/2021  LDLCALC 85 04/16/2021   TRIG 173 (H) 04/16/2021   CHOLHDL 3.2 04/16/2021   Lab Results  Component Value Date   VD25OH 84.2 01/11/2021   VD25OH 25.8 (L) 04/30/2019   Lab Results  Component Value Date   WBC 8.7 09/13/2019   HGB 14.2 09/13/2019   HCT 42.7 09/13/2019   MCV 90.2 09/13/2019   PLT 276.0 09/13/2019   No results found for: "IRON", "TIBC", "FERRITIN"  Attestation Statements:   Reviewed by clinician on day of visit: allergies, medications, problem list, medical history, surgical history, family history, social history, and  previous encounter notes.   I, Trixie Dredge, am acting as transcriptionist for Dennard Nip, MD.  I have reviewed the above documentation for accuracy and completeness, and I agree with the above. -  Dennard Nip, MD

## 2022-04-07 ENCOUNTER — Encounter: Payer: Self-pay | Admitting: *Deleted

## 2022-04-13 ENCOUNTER — Ambulatory Visit: Payer: 59 | Attending: Cardiology | Admitting: Cardiology

## 2022-04-13 ENCOUNTER — Encounter: Payer: Self-pay | Admitting: Cardiology

## 2022-04-13 VITALS — BP 110/66 | HR 79 | Ht 70.0 in | Wt 214.0 lb

## 2022-04-13 DIAGNOSIS — I5042 Chronic combined systolic (congestive) and diastolic (congestive) heart failure: Secondary | ICD-10-CM | POA: Diagnosis not present

## 2022-04-13 DIAGNOSIS — E785 Hyperlipidemia, unspecified: Secondary | ICD-10-CM

## 2022-04-13 DIAGNOSIS — I2510 Atherosclerotic heart disease of native coronary artery without angina pectoris: Secondary | ICD-10-CM | POA: Diagnosis not present

## 2022-04-13 DIAGNOSIS — G4733 Obstructive sleep apnea (adult) (pediatric): Secondary | ICD-10-CM

## 2022-04-13 DIAGNOSIS — I10 Essential (primary) hypertension: Secondary | ICD-10-CM

## 2022-04-13 LAB — COMPREHENSIVE METABOLIC PANEL
ALT: 19 IU/L (ref 0–44)
AST: 18 IU/L (ref 0–40)
Albumin/Globulin Ratio: 2.2 (ref 1.2–2.2)
Albumin: 4.6 g/dL (ref 3.9–4.9)
Alkaline Phosphatase: 60 IU/L (ref 44–121)
BUN/Creatinine Ratio: 16 (ref 10–24)
BUN: 14 mg/dL (ref 8–27)
Bilirubin Total: 0.3 mg/dL (ref 0.0–1.2)
CO2: 25 mmol/L (ref 20–29)
Calcium: 9.8 mg/dL (ref 8.6–10.2)
Chloride: 101 mmol/L (ref 96–106)
Creatinine, Ser: 0.86 mg/dL (ref 0.76–1.27)
Globulin, Total: 2.1 g/dL (ref 1.5–4.5)
Glucose: 132 mg/dL — ABNORMAL HIGH (ref 70–99)
Potassium: 4.8 mmol/L (ref 3.5–5.2)
Sodium: 141 mmol/L (ref 134–144)
Total Protein: 6.7 g/dL (ref 6.0–8.5)
eGFR: 95 mL/min/{1.73_m2} (ref >59)

## 2022-04-13 LAB — LIPID PANEL
Chol/HDL Ratio: 2.1 ratio (ref 0.0–5.0)
Cholesterol, Total: 121 mg/dL (ref 100–199)
HDL: 59 mg/dL (ref >39)
LDL Chol Calc (NIH): 31 mg/dL (ref 0–99)
Triglycerides: 199 mg/dL — ABNORMAL HIGH (ref 0–149)
VLDL Cholesterol Cal: 31 mg/dL (ref 5–40)

## 2022-04-13 NOTE — Progress Notes (Signed)
Cardiology Office Note:    Date:  04/13/2022   ID:  Travis Owens, DOB 08-22-1955, MRN [id number]  PCP:  [name], MD  Cardiologist:  Donato Heinz, MD  Electrophysiologist:  None   Referring MD: [name], MD   No chief complaint on file.    History of Present Illness:    Travis Owens is a 66 y.o. male with a hx of chronic combined systolic and diastolic heart failure, type 2 diabetes, hypertension, hyperlipidemia, OSA who present for follow-up.  He was is referred by Dr. Leafy Ro for evaluation of left bundle branch block on 05/01/18.  He was referred to Dr. Leafy Ro for weight loss evaluation, an EKG was done which showed left bundle branch block.  TTE was done on 05/03/2019 and showed EF 40 to 45%, with marked marked septal-lateral dyssynchrony consistent with LBBB.  Coronary CTA on 06/06/2019 showed nonobstructive CAD (calcified plaque in the RPDA causing mild (25-49%) stenosis and calcified plaque in the proximal LAD and proximal RCA causing minimal (0-24%) stenosis).  Calcium score 111 (62nd percentile for age/gender).  CMR on 07/25/2019 showed EF 45%, no LGE, RV EF 37%.  Echocardiogram 10/01/2021 showed EF 45 to 75%, grade 1 diastolic dysfunction, dyssynchrony due to LBBB, normal RV function, no significant valvular disease.  Since last clinic visit, he reports that he is doing well.  Denies any chest pain, dyspnea, lower extremity edema, or palpitations.  Reports rare lightheadedness but denies any syncope.  States he has not been using his BiPAP for the last few months.  He walks 3 days/week for 30 minutes and exercises with [employer] on days he does not walk.  Wt Readings from Last 3 Encounters:  04/13/22 214 lb (97.1 kg)  03/22/22 205 lb 9.6 oz (93.3 kg)  02/14/22 210 lb (95.3 kg)    BP Readings from Last 3 Encounters:  04/13/22 110/66  03/22/22 118/77  02/14/22 115/68   Wt Readings from Last 3 Encounters:  04/13/22 214 lb (97.1 kg)  03/22/22 205 lb 9.6  oz (93.3 kg)  02/14/22 210 lb (95.3 kg)    Past Medical History:  Diagnosis Date   Anxiety    Chest pain    Depression    Diabetes mellitus without complication ([place])    Fatty liver    GERD (gastroesophageal reflux disease)    Hyperlipidemia    Hypertension    Sleep apnea    has CPAP/does not use    Past Surgical History:  Procedure Laterality Date   COLONOSCOPY     Dr. Olevia Perches  2008    Current Medications: Current Meds  Medication Sig   acetaminophen (TYLENOL) 325 MG tablet Take 1 tablet by mouth as needed.   atorvastatin (LIPITOR) 80 MG tablet Take 1 tablet (80 mg total) by mouth daily.   Blood Glucose Monitoring Suppl (FREESTYLE LITE) DEVI Check Blood sugar twice daily   Cyanocobalamin (B-12) 1000 MCG CAPS Take 1,000 Int'l Units by mouth daily in the afternoon.   empagliflozin (JARDIANCE) 10 MG TABS tablet Take 1 tablet (10 mg total) by mouth daily before breakfast.   escitalopram (LEXAPRO) 20 MG tablet Take 1 tablet (20 mg total) by mouth daily.   Evolocumab (REPATHA SURECLICK) 449 MG/ML SOAJ Inject 1 mL into the skin every 14 (fourteen) days.   ezetimibe (ZETIA) 10 MG tablet Take 1 tablet (10 mg total) by mouth daily.   glucose blood (FREESTYLE LITE) test strip Check Blood sugar twice daily   ibuprofen (  ADVIL) 200 MG tablet Take 1 tablet by mouth as needed.   Lancets (FREESTYLE) lancets Check blood sugar twice daily   losartan (COZAAR) 25 MG tablet Take 1 tablet (25 mg total) by mouth daily.   metFORMIN (GLUCOPHAGE) 1000 MG tablet Take 1 tablet (1,000 mg total) by mouth 2 (two) times daily.   metoprolol succinate (TOPROL-XL) 100 MG 24 hr tablet Take 1.5 tablets (150 mg total) by mouth daily.   Multiple Vitamin (MULTIVITAMIN) tablet Take 1 tablet by mouth daily.   pantoprazole (PROTONIX) 40 MG tablet Take 1 tablet (40 mg total) by mouth daily.   Semaglutide-Weight Management (WEGOVY) 2.4 MG/0.75ML SOAJ Inject 2.4 mg into the skin once a week.   spironolactone  (ALDACTONE) 25 MG tablet Take 0.5 tablets (12.5 mg total) by mouth daily.     Allergies:   Patient has no known allergies.   Social History   Socioeconomic History   Marital status: Married    Spouse name: Jaelan Rasheed   Number of children: Not on file   Years of education: Not on file   Highest education level: Not on file  Occupational History   Occupation: Theme park manager    Comment: [employer]  Tobacco Use   Smoking status: Never   Smokeless tobacco: Never  Vaping Use   Vaping Use: Never used  Substance and Sexual Activity   Alcohol use: No   Drug use: No   Sexual activity: Yes  Other Topics Concern   Not on file  Social History Narrative   Not on file   Social Determinants of Health   Financial Resource Strain: Not on file  Food Insecurity: Not on file  Transportation Needs: Not on file  Physical Activity: Not on file  Stress: Not on file  Social Connections: Not on file     Family History: The patient's family history includes Cancer in his father; Hypertension in his paternal grandmother; Stroke in his father and paternal grandfather. There is no history of Colon cancer, Colon polyps, Esophageal cancer, Rectal cancer, or Stomach cancer.  ROS:   Please see the history of present illness.    All other systems reviewed and are negative.  EKGs/Labs/Other Studies Reviewed:    The following studies were reviewed today:  EKG:   04/13/2022: Normal sinus rhythm, rate 79, left bundle branch block 10/12/2021: Normal sinus rhythm, left bundle branch block, rate 76 04/16/21: LBBB, NSR, rate 85 07/14/2020: normal sinus rhythm, rate 86, left bundle branch block  TTE 05/03/19:  1. Left ventricular ejection fraction, by visual estimation, is 40 to 45%. The left ventricle has mild to moderately decreased function. There is mildly increased left ventricular hypertrophy. There is marked septal-lateral dyssynchrony consistent with LBBB.  2. Left ventricular diastolic  parameters are consistent with Grade I diastolic dysfunction (impaired relaxation).  3. Global right ventricle has normal systolic function.The right ventricular size is normal. No increase in right ventricular wall thickness.  4. Left atrial size was normal.  5. Right atrial size was normal.  6. The mitral valve is normal in structure. No evidence of mitral valve regurgitation. No evidence of mitral stenosis.  7. The tricuspid valve is normal in structure.  8. The aortic valve is tricuspid. Aortic valve regurgitation is not visualized. Mild aortic valve sclerosis without stenosis  9. TR signal is inadequate for assessing pulmonary artery systolic pressure. 10. The inferior vena cava is normal in size with greater than 50% respiratory variability, suggesting right atrial pressure of 3 mmHg.  Coronary CTA 06/06/19: 1. Coronary calcium score of 111. This was 34 percentile for age and sex matched control.   2. Normal coronary origin with right dominance.   3. Nonobstructive CAD. There is calcified plaque in the RPDA causing mild (25-49%) stenosis and calcified plaque in the proximal LAD and proximal RCA causing minimal (0-24%) stenosis   CAD-RADS 2. Mild non-obstructive CAD (25-49%). Consider non-atherosclerotic causes of chest pain. Consider preventive therapy and risk factor modification.1. Coronary calcium score of 111. This was 59 percentile for age and sex matched control.   2. Normal coronary origin with right dominance.   3. Nonobstructive CAD. There is calcified plaque in the RPDA causing mild (25-49%) stenosis and calcified plaque in the proximal LAD and proximal RCA causing minimal (0-24%) stenosis   CAD-RADS 2. Mild non-obstructive CAD (25-49%). Consider non-atherosclerotic causes of chest pain. Consider preventive therapy and risk factor modification.  Noncardiac: IMPRESSION: No significant noncardiac findings. Incidental 6 mm densely calcified granuloma in the  lingula.  CMR 07/25/19: 1. Normal LV size and thickness with markedly abnormal septal motion EF 45%. 2.  Possible LV non compaction seen best in SA/3 chamber views 3. No delayed gadolinium uptake, scar, infiltration, infarct in LV myocardium 4.  Basal RV dilatation and hypokinesis RVEF 37% 5   normal MV,AV, TV 6.  Normal Aortic root 3.0 cm 7.  Normal T2* 48 msec 8. Unable to calculate T1/ECG failure to acquire adequate T1 map sequence pre contrast      Recent Labs: 10/12/2021: BUN 19; Creatinine, Ser 0.81; Magnesium 2.1; Potassium 5.0; Sodium 141  Recent Lipid Panel    Component Value Date/Time   CHOL 166 04/16/2021 0839   TRIG 173 (H) 04/16/2021 0839   HDL 52 04/16/2021 0839   CHOLHDL 3.2 04/16/2021 0839   CHOLHDL 3 09/13/2019 0941   VLDL 33.2 09/13/2019 0941   LDLCALC 85 04/16/2021 0839    Physical Exam:    VS:  BP 110/66   Pulse 79   Ht '5\' 10"'$  (1.778 m)   Wt 214 lb (97.1 kg)   SpO2 98%   BMI 30.71 kg/m     Wt Readings from Last 3 Encounters:  04/13/22 214 lb (97.1 kg)  03/22/22 205 lb 9.6 oz (93.3 kg)  02/14/22 210 lb (95.3 kg)    GEN:   in no acute distress HEENT: Normal NECK: No JVD CARDIAC:RRR, no murmurs, rubs, gallops RESPIRATORY:  Clear to auscultation without rales, wheezing or rhonchi  ABDOMEN: Soft, non-tender, non-distended MUSCULOSKELETAL:  No edema; No deformity  SKIN: Warm and dry NEUROLOGIC:  Alert and oriented x 3 PSYCHIATRIC:  Normal affect   ASSESSMENT:    1. Chronic combined systolic and diastolic heart failure ([place])   2. Coronary artery disease involving native coronary artery of native heart without angina pectoris   3. Essential hypertension   4. Hyperlipidemia with target LDL less than 70   5. OSA on BiPAP      PLAN:    Chronic combined systolic and diastolic heart failure: EF 40 to 45% on TTE from 05/02/18, with marked septal-lateral dyssynchrony consistent with LBBB.  Coronary CTA showed nonobstructive CAD.  CMR 07/25/19 showed  EF 45%, no LGE.  Echocardiogram 10/01/2021 showed EF 45 to 14%, grade 1 diastolic dysfunction, dyssynchrony due to LBBB, normal RV function, no significant valvular disease.  Appears euvolemic.  Suspect systolic dysfunction due to LBBB -Continue losartan 25 mg daily.  Previously was on Entresto but discontinued due to low BP. -Continue Toprol-XL 150  mg daily -Continue Jardiance 10 mg daily -Continue spironolactone 12.5 mg daily -Check CMET  Coronary artery disease: coronary CTA on 06/06/2019 showed nonobstructive CAD (calcified plaque in the RPDA causing mild (25-49%) stenosis and calcified plaque in the proximal LAD and proximal RCA causing minimal (0-24%) stenosis.  Calcium score 111 (62nd percentile for age/gender). -Continue atorvastatin 80 mg daily  Hypertension: Continue Toprol-XL, losartan, spironolactone as above  Hyperlipidemia: LDL 85 on 04/16/2021 on atorvastatin 80 mg daily and Zetia 10 mg daily.  Referred to pharmacy lipid clinic and started on [place].  Check lipid panel  Type 2 diabetes: [name] 6.1% on 04/22/21.  On metformin, semaglutide, Jardiance  OSA: Sleep study on 05/15/2019 confirmed OSA.  Reports has not been using his BiPAP, encourage compliance  RTC in 6 months  Medication Adjustments/Labs and Tests Ordered: Current medicines are reviewed at length with the patient today.  Concerns regarding medicines are outlined above.  Orders Placed This Encounter  Procedures   Comprehensive metabolic panel   Lipid panel   EKG 12-Lead    No orders of the defined types were placed in this encounter.    Patient Instructions  Medication Instructions:  Your physician recommends that you continue on your current medications as directed. Please refer to the Current Medication list given to you today.  *If you need a refill on your cardiac medications before your next appointment, please call your pharmacy*  Lab Work: CMET, Lipid today  If you have labs (blood work) drawn today  and your tests are completely normal, you will receive your results only by: [place] (if you have MyChart) OR A paper copy in the mail If you have any lab test that is abnormal or we need to change your treatment, we will call you to review the results.  Follow-Up: At [facility], you and your health needs are our priority.  As part of our continuing mission to provide you with exceptional heart care, we have created designated Provider Care Teams.  These Care Teams include your primary Cardiologist (physician) and Advanced Practice Providers (APPs -  Physician Assistants and Nurse Practitioners) who all work together to provide you with the care you need, when you need it.  We recommend signing up for the patient portal called "MyChart".  Sign up information is provided on this After Visit Summary.  MyChart is used to connect with patients for Virtual Visits (Telemedicine).  Patients are able to view lab/test results, encounter notes, upcoming appointments, etc.  Non-urgent messages can be sent to your provider as well.   To learn more about what you can do with MyChart, go to [email or web address].    Your next appointment:   6 month(s)  The format for your next appointment:   In Person  Provider:   Donato Heinz, MD            Signed, Donato Heinz, MD  04/13/2022 9:37 AM    [place]

## 2022-04-13 NOTE — Patient Instructions (Addendum)
Medication Instructions:  Your physician recommends that you continue on your current medications as directed. Please refer to the Current Medication list given to you today.  *If you need a refill on your cardiac medications before your next appointment, please call your pharmacy*  Lab Work: CMET, Lipid today  If you have labs (blood work) drawn today and your tests are completely normal, you will receive your results only by: [place] (if you have MyChart) OR A paper copy in the mail If you have any lab test that is abnormal or we need to change your treatment, we will call you to review the results.  Follow-Up: At [facility], you and your health needs are our priority.  As part of our continuing mission to provide you with exceptional heart care, we have created designated Provider Care Teams.  These Care Teams include your primary Cardiologist (physician) and Advanced Practice Providers (APPs -  Physician Assistants and Nurse Practitioners) who all work together to provide you with the care you need, when you need it.  We recommend signing up for the patient portal called "MyChart".  Sign up information is provided on this After Visit Summary.  MyChart is used to connect with patients for Virtual Visits (Telemedicine).  Patients are able to view lab/test results, encounter notes, upcoming appointments, etc.  Non-urgent messages can be sent to your provider as well.   To learn more about what you can do with MyChart, go to [email or web address].    Your next appointment:   6 month(s)  The format for your next appointment:   In Person  Provider:   Donato Heinz, MD

## 2022-04-21 ENCOUNTER — Encounter: Payer: Self-pay | Admitting: *Deleted

## 2022-04-22 ENCOUNTER — Other Ambulatory Visit (HOSPITAL_COMMUNITY): Payer: Self-pay

## 2022-04-22 ENCOUNTER — Other Ambulatory Visit: Payer: Self-pay

## 2022-05-03 ENCOUNTER — Ambulatory Visit (INDEPENDENT_AMBULATORY_CARE_PROVIDER_SITE_OTHER): Payer: Medicare Other | Admitting: Family Medicine

## 2022-05-04 ENCOUNTER — Encounter (INDEPENDENT_AMBULATORY_CARE_PROVIDER_SITE_OTHER): Payer: Self-pay | Admitting: Family Medicine

## 2022-05-04 ENCOUNTER — Ambulatory Visit (INDEPENDENT_AMBULATORY_CARE_PROVIDER_SITE_OTHER): Payer: 59 | Admitting: Family Medicine

## 2022-05-04 ENCOUNTER — Other Ambulatory Visit (HOSPITAL_COMMUNITY): Payer: Self-pay

## 2022-05-04 VITALS — BP 109/68 | HR 83 | Temp 98.1°F | Ht 70.0 in | Wt 211.0 lb

## 2022-05-04 DIAGNOSIS — Z794 Long term (current) use of insulin: Secondary | ICD-10-CM

## 2022-05-04 DIAGNOSIS — Z6830 Body mass index (BMI) 30.0-30.9, adult: Secondary | ICD-10-CM | POA: Diagnosis not present

## 2022-05-04 DIAGNOSIS — Z7984 Long term (current) use of oral hypoglycemic drugs: Secondary | ICD-10-CM

## 2022-05-04 DIAGNOSIS — E1169 Type 2 diabetes mellitus with other specified complication: Secondary | ICD-10-CM

## 2022-05-04 DIAGNOSIS — E669 Obesity, unspecified: Secondary | ICD-10-CM | POA: Diagnosis not present

## 2022-05-04 DIAGNOSIS — E66811 Obesity, class 1: Secondary | ICD-10-CM

## 2022-05-04 MED ORDER — WEGOVY 2.4 MG/0.75ML ~~LOC~~ SOAJ
2.4000 mg | SUBCUTANEOUS | 0 refills | Status: DC
  Filled 2022-05-04: qty 3, 28d supply, fill #0

## 2022-05-06 ENCOUNTER — Ambulatory Visit (INDEPENDENT_AMBULATORY_CARE_PROVIDER_SITE_OTHER): Payer: 59 | Admitting: Family Medicine

## 2022-05-06 ENCOUNTER — Encounter: Payer: Self-pay | Admitting: Family Medicine

## 2022-05-06 ENCOUNTER — Other Ambulatory Visit (HOSPITAL_COMMUNITY): Payer: Self-pay

## 2022-05-06 ENCOUNTER — Encounter: Payer: 59 | Admitting: Family Medicine

## 2022-05-06 VITALS — BP 116/68 | HR 82 | Temp 97.3°F | Ht 70.0 in | Wt 215.4 lb

## 2022-05-06 DIAGNOSIS — I152 Hypertension secondary to endocrine disorders: Secondary | ICD-10-CM

## 2022-05-06 DIAGNOSIS — Z1211 Encounter for screening for malignant neoplasm of colon: Secondary | ICD-10-CM

## 2022-05-06 DIAGNOSIS — E1165 Type 2 diabetes mellitus with hyperglycemia: Secondary | ICD-10-CM

## 2022-05-06 DIAGNOSIS — E559 Vitamin D deficiency, unspecified: Secondary | ICD-10-CM

## 2022-05-06 DIAGNOSIS — F325 Major depressive disorder, single episode, in full remission: Secondary | ICD-10-CM | POA: Diagnosis not present

## 2022-05-06 DIAGNOSIS — E1159 Type 2 diabetes mellitus with other circulatory complications: Secondary | ICD-10-CM

## 2022-05-06 DIAGNOSIS — Z23 Encounter for immunization: Secondary | ICD-10-CM

## 2022-05-06 DIAGNOSIS — Z0001 Encounter for general adult medical examination with abnormal findings: Secondary | ICD-10-CM | POA: Diagnosis not present

## 2022-05-06 DIAGNOSIS — E1169 Type 2 diabetes mellitus with other specified complication: Secondary | ICD-10-CM | POA: Diagnosis not present

## 2022-05-06 DIAGNOSIS — E785 Hyperlipidemia, unspecified: Secondary | ICD-10-CM

## 2022-05-06 DIAGNOSIS — Z125 Encounter for screening for malignant neoplasm of prostate: Secondary | ICD-10-CM | POA: Diagnosis not present

## 2022-05-06 LAB — POCT GLYCOSYLATED HEMOGLOBIN (HGB A1C): Hemoglobin A1C: 7.2 % — AB (ref 4.0–5.6)

## 2022-05-06 LAB — MICROALBUMIN / CREATININE URINE RATIO
Creatinine,U: 122.7 mg/dL
Microalb Creat Ratio: 0.6 mg/g (ref 0.0–30.0)
Microalb, Ur: 0.7 mg/dL (ref 0.0–1.9)

## 2022-05-06 LAB — PSA: PSA: 1.76 ng/mL (ref 0.10–4.00)

## 2022-05-06 LAB — TSH: TSH: 2.36 u[IU]/mL (ref 0.35–5.50)

## 2022-05-06 LAB — VITAMIN D 25 HYDROXY (VIT D DEFICIENCY, FRACTURES): VITD: 37.52 ng/mL (ref 30.00–100.00)

## 2022-05-06 MED ORDER — ESCITALOPRAM OXALATE 20 MG PO TABS
20.0000 mg | ORAL_TABLET | Freq: Every day | ORAL | 3 refills | Status: DC
  Filled 2022-05-06: qty 90, 90d supply, fill #0

## 2022-05-06 MED ORDER — EZETIMIBE 10 MG PO TABS
10.0000 mg | ORAL_TABLET | Freq: Every day | ORAL | 3 refills | Status: DC
  Filled 2022-05-06: qty 90, 90d supply, fill #0
  Filled 2022-08-22: qty 90, 90d supply, fill #1
  Filled 2022-11-30: qty 90, 90d supply, fill #2

## 2022-05-06 MED ORDER — EMPAGLIFLOZIN 10 MG PO TABS
10.0000 mg | ORAL_TABLET | Freq: Every day | ORAL | 3 refills | Status: DC
  Filled 2022-05-06 – 2022-08-22 (×2): qty 90, 90d supply, fill #0
  Filled 2022-11-30: qty 90, 90d supply, fill #1
  Filled 2023-02-27: qty 90, 90d supply, fill #2

## 2022-05-06 MED ORDER — ATORVASTATIN CALCIUM 80 MG PO TABS
80.0000 mg | ORAL_TABLET | Freq: Every day | ORAL | 3 refills | Status: DC
  Filled 2022-05-06 – 2022-08-22 (×2): qty 90, 90d supply, fill #0
  Filled 2022-11-30: qty 90, 90d supply, fill #1
  Filled 2023-02-27: qty 90, 90d supply, fill #2

## 2022-05-06 MED ORDER — PANTOPRAZOLE SODIUM 40 MG PO TBEC
40.0000 mg | DELAYED_RELEASE_TABLET | Freq: Every day | ORAL | 3 refills | Status: DC
  Filled 2022-05-06 – 2022-08-22 (×2): qty 90, 90d supply, fill #0
  Filled 2022-11-30: qty 90, 90d supply, fill #1
  Filled 2023-03-28 (×2): qty 90, 90d supply, fill #2

## 2022-05-06 MED ORDER — LOSARTAN POTASSIUM 25 MG PO TABS
25.0000 mg | ORAL_TABLET | Freq: Every day | ORAL | 3 refills | Status: DC
  Filled 2022-05-06 – 2022-05-12 (×3): qty 90, 90d supply, fill #0
  Filled 2022-08-22: qty 90, 90d supply, fill #1
  Filled 2022-11-30: qty 90, 90d supply, fill #2
  Filled 2023-02-14: qty 90, 90d supply, fill #3

## 2022-05-06 MED ORDER — METFORMIN HCL 1000 MG PO TABS
1000.0000 mg | ORAL_TABLET | Freq: Two times a day (BID) | ORAL | 3 refills | Status: DC
  Filled 2022-05-06: qty 180, 90d supply, fill #0
  Filled 2022-08-22: qty 180, 90d supply, fill #1
  Filled 2022-11-30: qty 180, 90d supply, fill #2
  Filled 2023-03-15: qty 180, 90d supply, fill #3

## 2022-05-06 NOTE — Assessment & Plan Note (Signed)
Check vitamin D.

## 2022-05-06 NOTE — Assessment & Plan Note (Signed)
On Lexapro 20 mg daily.

## 2022-05-06 NOTE — Assessment & Plan Note (Signed)
Blood pressure at goal today on current regimen losartan 25 mg daily, spironolactone 12.5 mg daily, metoprolol succinate 150 mg daily.

## 2022-05-06 NOTE — Addendum Note (Signed)
Addended by: Betti Cruz on: 05/06/2022 08:49 AM   Modules accepted: Orders

## 2022-05-06 NOTE — Progress Notes (Signed)
Chief Complaint:  Travis Owens is a 67 y.o. male who presents today for his annual comprehensive physical exam.    Assessment/Plan:  Chronic Problems Addressed Today: Type 2 diabetes mellitus with hyperglycemia, without long-term current use of insulin ([name]) [name] up a little bit to 7.1.  He is working on lifestyle modifications.  We did discuss switching his Wegovy to [facility] however he deferred for now.  He will let me know if he would like to make this switch.  We will continue metformin 1000 mg twice daily, Jardiance 10 mg daily.  We did discuss consolidating this regimen into Synjardy 08-998 twice daily.  He will check with his pharmacist and let us know if he like for [place] to assess him.  Will see him back in 6 months to recheck [name].  Hypertension associated with diabetes ([place]) Blood pressure at goal today on current regimen losartan 25 mg daily, spironolactone 12.5 mg daily, metoprolol succinate 150 mg daily.  Hyperlipidemia associated with type 2 diabetes mellitus ([place]) Recent lipid panel at goal.  This is managed by cardiology.  He is on Repatha and Lipitor 80 mg daily.  Depression, major, single episode, complete remission ([name]) On Lexapro 20 mg daily.  Vitamin D deficiency Check vitamin D.  Preventative Healthcare: Check labs. Recently had lipids done with cardiology - will not recheck today.  Flu shot given today.  Refer for colonoscopy.  Patient Counseling(The following topics were reviewed and/or handout was given):  -Nutrition: Stressed importance of moderation in sodium/caffeine intake, saturated fat and cholesterol, caloric balance, sufficient intake of fresh fruits, vegetables, and fiber.  -Stressed the importance of regular exercise.   -Substance Abuse: Discussed cessation/primary prevention of tobacco, alcohol, or other drug use; driving or other dangerous activities under the influence; availability of treatment for abuse.   -Injury prevention: Discussed safety  belts, safety helmets, smoke detector, smoking near bedding or upholstery.   -Sexuality: Discussed sexually transmitted diseases, partner selection, use of condoms, avoidance of unintended pregnancy and contraceptive alternatives.   -Dental health: Discussed importance of regular tooth brushing, flossing, and dental visits.  -Health maintenance and immunizations reviewed. Please refer to Health maintenance section.  Return to care in 1 year for next preventative visit.     Subjective:  HPI:  He has no acute complaints today. See A/p for status of chronic conditions. He was last seen here about 6 months ago. He was doing well at time. .   Lifestyle Diet: Balanced. Trying to get plenty of fruits and vegetables. Follows with weight management.  Exercise: Trying to get exercise.      05/06/2022    8:30 AM  Depression screen PHQ 2/9  Decreased Interest 0  Down, Depressed, Hopeless 0  PHQ - 2 Score 0  Altered sleeping 0  Tired, decreased energy 0  Change in appetite 0  Feeling bad or failure about yourself  0  Trouble concentrating 0  Moving slowly or fidgety/restless 0  Suicidal thoughts 0  PHQ-9 Score 0  Difficult doing work/chores Not difficult at all    Health Maintenance Due  Topic Date Due   Diabetic kidney evaluation - Urine ACR  04/29/2020   FOOT EXAM  09/12/2020   INFLUENZA VACCINE  11/23/2021   COLONOSCOPY (Pts 45-31yr Insurance coverage will need to be confirmed)  03/13/2022     ROS: Per HPI, otherwise a complete review of systems was negative.   PMH:  The following were reviewed and entered/updated in epic: Past Medical History:  Diagnosis Date   Anxiety    Chest pain    Depression    Diabetes mellitus without complication ([place])    Fatty liver    GERD (gastroesophageal reflux disease)    Hyperlipidemia    Hypertension    Sleep apnea    has CPAP/does not use   Patient Active Problem List   Diagnosis Date Noted   Chronic systolic heart failure ([place])  08/27/2020   Vitamin D deficiency 06/24/2020   Class 1 obesity with serious comorbidity and body mass index (BMI) of 30.0 to 30.9 in adult 06/24/2020   OSA treated with BiPAP 03/18/2020   Hypertension associated with diabetes ([place]) 12/05/2016   Type 2 diabetes mellitus with hyperglycemia, without long-term current use of insulin ([place]) 12/05/2016   Hyperlipidemia associated with type 2 diabetes mellitus ([place]) 12/05/2016   Depression, major, single episode, complete remission ([place]) 12/05/2016   Past Surgical History:  Procedure Laterality Date   COLONOSCOPY     Dr. Olevia Perches  2008    Family History  Problem Relation Age of Onset   Cancer Father        Lung Cancer   Stroke Father    Hypertension Paternal Grandmother    Stroke Paternal Grandfather    Colon cancer Neg Hx    Colon polyps Neg Hx    Esophageal cancer Neg Hx    Rectal cancer Neg Hx    Stomach cancer Neg Hx     Medications- reviewed and updated Current Outpatient Medications  Medication Sig Dispense Refill   acetaminophen (TYLENOL) 325 MG tablet Take 1 tablet by mouth as needed.     Blood Glucose Monitoring Suppl (FREESTYLE LITE) DEVI Check Blood sugar twice daily 1 Device 0   Cyanocobalamin (B-12) 1000 MCG CAPS Take 1,000 Int'l Units by mouth daily in the afternoon.     Evolocumab (REPATHA SURECLICK) 381 MG/ML SOAJ Inject 1 mL into the skin every 14 (fourteen) days. 6 mL 3   glucose blood (FREESTYLE LITE) test strip Check Blood sugar twice daily 100 each 12   ibuprofen (ADVIL) 200 MG tablet Take 1 tablet by mouth as needed.     Lancets (FREESTYLE) lancets Check blood sugar twice daily 100 each 12   metoprolol succinate (TOPROL-XL) 100 MG 24 hr tablet Take 1.5 tablets (150 mg total) by mouth daily. 45 tablet 5   Multiple Vitamin (MULTIVITAMIN) tablet Take 1 tablet by mouth daily.     Semaglutide-Weight Management (WEGOVY) 2.4 MG/0.75ML SOAJ Inject 2.4 mg into the skin once a week. 3 mL 0   spironolactone (ALDACTONE) 25  MG tablet Take 0.5 tablets (12.5 mg total) by mouth daily. 45 tablet 3   atorvastatin (LIPITOR) 80 MG tablet Take 1 tablet (80 mg total) by mouth daily. 90 tablet 3   empagliflozin (JARDIANCE) 10 MG TABS tablet Take 1 tablet (10 mg total) by mouth daily before breakfast. 90 tablet 3   escitalopram (LEXAPRO) 20 MG tablet Take 1 tablet (20 mg total) by mouth daily. 90 tablet 3   ezetimibe (ZETIA) 10 MG tablet Take 1 tablet (10 mg total) by mouth daily. 90 tablet 3   losartan (COZAAR) 25 MG tablet Take 1 tablet (25 mg total) by mouth daily. 90 tablet 3   metFORMIN (GLUCOPHAGE) 1000 MG tablet Take 1 tablet (1,000 mg total) by mouth 2 (two) times daily. 180 tablet 3   pantoprazole (PROTONIX) 40 MG tablet Take 1 tablet (40 mg total) by mouth daily. 90 tablet 3   No current  facility-administered medications for this visit.   Allergies-reviewed and updated No Known Allergies  Social History   Socioeconomic History   Marital status: Married    Spouse name: Robertlee Rogacki   Number of children: Not on file   Years of education: Not on file   Highest education level: Not on file  Occupational History   Occupation: Theme park manager    Comment: [place]  Tobacco Use   Smoking status: Never   Smokeless tobacco: Never  Vaping Use   Vaping Use: Never used  Substance and Sexual Activity   Alcohol use: No   Drug use: No   Sexual activity: Yes  Other Topics Concern   Not on file  Social History Narrative   Not on file   Social Determinants of Health   Financial Resource Strain: Not on file  Food Insecurity: Not on file  Transportation Needs: Not on file  Physical Activity: Not on file  Stress: Not on file  Social Connections: Not on file        Objective:  Physical Exam: BP 116/68   Pulse 82   Temp (!) 97.3 F (36.3 C) (Temporal)   Ht '5\' 10"'$  (1.778 m)   Wt 215 lb 6.4 oz (97.7 kg)   SpO2 96%   BMI 30.91 kg/m   Body mass index is 30.91 kg/m. Wt Readings from Last 3  Encounters:  05/06/22 215 lb 6.4 oz (97.7 kg)  05/04/22 211 lb (95.7 kg)  04/13/22 214 lb (97.1 kg)   Gen: NAD, resting comfortably HEENT: TMs normal bilaterally. OP clear. No thyromegaly noted.  CV: RRR with no murmurs appreciated Pulm: NWOB, CTAB with no crackles, wheezes, or rhonchi GI: Normal bowel sounds present. Soft, Nontender, Nondistended. MSK: no edema, cyanosis, or clubbing noted Skin: warm, dry Neuro: CN2-12 grossly intact. Strength 5/5 in upper and lower extremities. Reflexes symmetric and intact bilaterally.  Psych: Normal affect and thought content     Iman Orourke M. [name], MD 05/06/2022 8:41 AM

## 2022-05-06 NOTE — Patient Instructions (Signed)
It was very nice to see you today!  We will check blood work today.  Your [name] is 7.1.  We can continue your current medications.  Please let me know if you would like to switch to [facility].  Please talk with your pharmacist about switching your metformin and Jardiance to [place].  We will refer you for your colonoscopy.  Please continue work on diet and exercise.  Will see back in 6 months for diabetes recheck.  Come back sooner if needed.  Take care, Dr [name]  PLEASE NOTE:  If you had any lab tests, please let us know if you have not heard back within a few days. You may see your results on mychart before we have a chance to review them but we will give you a call once they are reviewed by [place].   If we ordered any referrals today, please let us know if you have not heard from their office within the next week.   If you had any urgent prescriptions sent in today, please check with the pharmacy within an hour of our visit to make sure the prescription was transmitted appropriately.   Please try these tips to maintain a healthy lifestyle:  Eat at least 3 REAL meals and 1-2 snacks per day.  Aim for no more than 5 hours between eating.  If you eat breakfast, please do so within one hour of getting up.   Each meal should contain half fruits/vegetables, one quarter protein, and one quarter carbs (no bigger than a computer mouse)  Cut down on sweet beverages. This includes juice, soda, and sweet tea.   Drink at least 1 glass of water with each meal and aim for at least 8 glasses per day  Exercise at least 150 minutes every week.    Preventive Care 24 Years and Older, Male Preventive care refers to lifestyle choices and visits with your health care provider that can promote health and wellness. Preventive care visits are also called wellness exams. What can I expect for my preventive care visit? Counseling During your preventive care visit, your health care provider may ask about  your: Medical history, including: Past medical problems. Family medical history. History of falls. Current health, including: Emotional well-being. Home life and relationship well-being. Sexual activity. Memory and ability to understand (cognition). Lifestyle, including: Alcohol, nicotine or tobacco, and drug use. Access to firearms. Diet, exercise, and sleep habits. Work and work Statistician. Sunscreen use. Safety issues such as seatbelt and bike helmet use. Physical exam Your health care provider will check your: Height and weight. These may be used to calculate your BMI (body mass index). BMI is a measurement that tells if you are at a healthy weight. Waist circumference. This measures the distance around your waistline. This measurement also tells if you are at a healthy weight and may help predict your risk of certain diseases, such as type 2 diabetes and high blood pressure. Heart rate and blood pressure. Body temperature. Skin for abnormal spots. What immunizations do I need?  Vaccines are usually given at various ages, according to a schedule. Your health care provider will recommend vaccines for you based on your age, medical history, and lifestyle or other factors, such as travel or where you work. What tests do I need? Screening Your health care provider may recommend screening tests for certain conditions. This may include: Lipid and cholesterol levels. Diabetes screening. This is done by checking your blood sugar (glucose) after you have not eaten for  a while (fasting). Hepatitis C test. Hepatitis B test. HIV (human immunodeficiency virus) test. STI (sexually transmitted infection) testing, if you are at risk. Lung cancer screening. Colorectal cancer screening. Prostate cancer screening. Abdominal aortic aneurysm (AAA) screening. You may need this if you are a current or former smoker. Talk with your health care provider about your test results, treatment options,  and if necessary, the need for more tests. Follow these instructions at home: Eating and drinking  Eat a diet that includes fresh fruits and vegetables, whole grains, lean protein, and low-fat dairy products. Limit your intake of foods with high amounts of sugar, saturated fats, and salt. Take vitamin and mineral supplements as recommended by your health care provider. Do not drink alcohol if your health care provider tells you not to drink. If you drink alcohol: Limit how much you have to 0-2 drinks a day. Know how much alcohol is in your drink. In the [place] one drink equals one 12 oz bottle of beer (355 mL), one 5 oz glass of wine (148 mL), or one 1 oz glass of hard liquor (44 mL). Lifestyle Brush your teeth every morning and night with fluoride toothpaste. Floss one time each day. Exercise for at least 30 minutes 5 or more days each week. Do not use any products that contain nicotine or tobacco. These products include cigarettes, chewing tobacco, and vaping devices, such as e-cigarettes. If you need help quitting, ask your health care provider. Do not use drugs. If you are sexually active, practice safe sex. Use a condom or other form of protection to prevent STIs. Take aspirin only as told by your health care provider. Make sure that you understand how much to take and what form to take. Work with your health care provider to find out whether it is safe and beneficial for you to take aspirin daily. Ask your health care provider if you need to take a cholesterol-lowering medicine (statin). Find healthy ways to manage stress, such as: Meditation, yoga, or listening to music. Journaling. Talking to a trusted person. Spending time with friends and family. Safety Always wear your seat belt while driving or riding in a vehicle. Do not drive: If you have been drinking alcohol. Do not ride with someone who has been drinking. When you are tired or distracted. While texting. If you have been  using any mind-altering substances or drugs. Wear a helmet and other protective equipment during sports activities. If you have firearms in your house, make sure you follow all gun safety procedures. Minimize exposure to UV radiation to reduce your risk of skin cancer. What's next? Visit your health care provider once a year for an annual wellness visit. Ask your health care provider how often you should have your eyes and teeth checked. Stay up to date on all vaccines. This information is not intended to replace advice given to you by your health care provider. Make sure you discuss any questions you have with your health care provider. Document Revised: 10/07/2020 Document Reviewed: 10/07/2020 Elsevier Patient Education  [place].

## 2022-05-06 NOTE — Assessment & Plan Note (Signed)
Recent lipid panel at goal.  This is managed by cardiology.  He is on Repatha and Lipitor 80 mg daily.

## 2022-05-06 NOTE — Assessment & Plan Note (Signed)
[name] up a little bit to 7.1.  He is working on lifestyle modifications.  We did discuss switching his Wegovy to [facility] however he deferred for now.  He will let me know if he would like to make this switch.  We will continue metformin 1000 mg twice daily, Jardiance 10 mg daily.  We did discuss consolidating this regimen into Synjardy 08-998 twice daily.  He will check with his pharmacist and let us know if he like for [place] to assess him.  Will see him back in 6 months to recheck [name].

## 2022-05-12 ENCOUNTER — Other Ambulatory Visit: Payer: Self-pay | Admitting: Cardiology

## 2022-05-12 ENCOUNTER — Other Ambulatory Visit (HOSPITAL_COMMUNITY): Payer: Self-pay

## 2022-05-12 DIAGNOSIS — I152 Hypertension secondary to endocrine disorders: Secondary | ICD-10-CM

## 2022-05-12 MED ORDER — METOPROLOL SUCCINATE ER 100 MG PO TB24
150.0000 mg | ORAL_TABLET | Freq: Every day | ORAL | 6 refills | Status: DC
  Filled 2022-05-12: qty 45, 30d supply, fill #0
  Filled 2022-06-21: qty 45, 30d supply, fill #1
  Filled 2022-07-21: qty 45, 30d supply, fill #2
  Filled 2022-08-22: qty 45, 30d supply, fill #3
  Filled 2022-09-29: qty 45, 30d supply, fill #4
  Filled 2022-11-11: qty 45, 30d supply, fill #5
  Filled 2022-12-13: qty 45, 30d supply, fill #6

## 2022-05-12 NOTE — Progress Notes (Signed)
Please inform patient of the following:  Great news! His labs are all stable. Would like for him to keep working on diet and exercise and we can recheck these in a year.  Travis Owens. [name], MD 05/12/2022 8:47 AM

## 2022-05-16 ENCOUNTER — Other Ambulatory Visit (HOSPITAL_COMMUNITY): Payer: Self-pay

## 2022-05-16 NOTE — Progress Notes (Signed)
Chief Complaint:   OBESITY Travis Owens is here to discuss his progress with his obesity treatment plan along with follow-up of his obesity related diagnoses. Travis Owens is on the Category 4 Plan and states he is following his eating plan approximately 25% of the time. Travis Owens states he is active while doing yard work, and walking for 30 minutes 2 times per week.  Today's visit was #: 40 Starting weight: 220 lbs Starting date: 04/30/2019 Today's weight: 211 lbs Today's date: 05/04/2022 Total lbs lost to date: 9 Total lbs lost since last in-office visit: 0  Interim History: Travis Owens did some celebration eating over the holidays, and he has gained some weight. Some of which is just water weight. He is working on getting back on track with his eating plan. No side effects were noted with [facility].   Subjective:   1. Type 2 diabetes mellitus with other specified complication, with long-term current use of insulin ([name]) Travis Owens is working on his diet and weight loss, but he has indulged in more calories over the holidays. He is getting back on track with his eating plan.   Assessment/Plan:   1. Type 2 diabetes mellitus with other specified complication, with long-term current use of insulin ([name]) Travis Owens is at high risk of dehydration and hypoglycemia with Jardiance, especially while losing weight. Will continue to monitor for these side effects.   2. Obesity, Current BMI 30.3 Travis Owens will continue with Wegovy 2.4 mg once weekly, and we will refill for 1 month.   - Semaglutide-Weight Management (WEGOVY) 2.4 MG/0.75ML SOAJ; Inject 2.4 mg into the skin once a week.  Dispense: 3 mL; Refill: 0  [name] is currently in the action stage of change. As such, his goal is to continue with weight loss efforts. He has agreed to the Category 4 Plan.   Exercise goals: As is.   Behavioral modification strategies: increasing lean protein intake and no skipping meals.  Travis Owens has agreed to follow-up with our clinic in 4 weeks. He was  informed of the importance of frequent follow-up visits to maximize his success with intensive lifestyle modifications for his multiple health conditions.   Objective:   Blood pressure 109/68, pulse 83, temperature 98.1 F (36.7 C), height '5\' 10"'$  (1.778 m), weight 211 lb (95.7 kg), SpO2 94 %. Body mass index is 30.28 kg/m.  General: Cooperative, alert, well developed, in no acute distress. HEENT: Conjunctivae and lids unremarkable. Cardiovascular: Regular rhythm.  Lungs: Normal work of breathing. Neurologic: No focal deficits.   Lab Results  Component Value Date   CREATININE 0.86 04/13/2022   BUN 14 04/13/2022   NA 141 04/13/2022   K 4.8 04/13/2022   CL 101 04/13/2022   CO2 25 04/13/2022   Lab Results  Component Value Date   ALT 19 04/13/2022   AST 18 04/13/2022   ALKPHOS 60 04/13/2022   BILITOT 0.3 04/13/2022   Lab Results  Component Value Date   HGBA1C 7.2 (A) 05/06/2022   HGBA1C 6.7 (A) 11/04/2021   HGBA1C 6.1 (A) 04/22/2021   HGBA1C 7.2 (A) 10/21/2020   HGBA1C 10.7 (A) 06/18/2020   Lab Results  Component Value Date   INSULIN 36.0 (H) 01/11/2021   INSULIN 24.2 04/30/2019   Lab Results  Component Value Date   TSH 2.36 05/06/2022   Lab Results  Component Value Date   CHOL 121 04/13/2022   HDL 59 04/13/2022   LDLCALC 31 04/13/2022   TRIG 199 (H) 04/13/2022   CHOLHDL 2.1 04/13/2022  Lab Results  Component Value Date   VD25OH 37.52 05/06/2022   VD25OH 84.2 01/11/2021   VD25OH 25.8 (L) 04/30/2019   Lab Results  Component Value Date   WBC 8.7 09/13/2019   HGB 14.2 09/13/2019   HCT 42.7 09/13/2019   MCV 90.2 09/13/2019   PLT 276.0 09/13/2019   No results found for: "IRON", "TIBC", "FERRITIN"  Attestation Statements:   Reviewed by clinician on day of visit: allergies, medications, problem list, medical history, surgical history, family history, social history, and previous encounter notes.  Time spent on visit including pre-visit chart review and  post-visit care and charting was 36 minutes.   I, Trixie Dredge, am acting as transcriptionist for Dennard Nip, MD.  I have reviewed the above documentation for accuracy and completeness, and I agree with the above. -  Dennard Nip, MD

## 2022-05-19 ENCOUNTER — Other Ambulatory Visit (HOSPITAL_COMMUNITY): Payer: Self-pay

## 2022-05-20 ENCOUNTER — Other Ambulatory Visit (HOSPITAL_COMMUNITY): Payer: Self-pay

## 2022-05-23 ENCOUNTER — Other Ambulatory Visit (HOSPITAL_COMMUNITY): Payer: Self-pay

## 2022-06-01 ENCOUNTER — Ambulatory Visit (INDEPENDENT_AMBULATORY_CARE_PROVIDER_SITE_OTHER): Payer: 59 | Admitting: Family Medicine

## 2022-06-01 ENCOUNTER — Other Ambulatory Visit (HOSPITAL_COMMUNITY): Payer: Self-pay

## 2022-06-01 VITALS — BP 115/71 | HR 73 | Temp 98.2°F | Ht 70.0 in | Wt 209.0 lb

## 2022-06-01 DIAGNOSIS — E669 Obesity, unspecified: Secondary | ICD-10-CM | POA: Diagnosis not present

## 2022-06-01 DIAGNOSIS — I10 Essential (primary) hypertension: Secondary | ICD-10-CM | POA: Diagnosis not present

## 2022-06-01 DIAGNOSIS — Z6830 Body mass index (BMI) 30.0-30.9, adult: Secondary | ICD-10-CM

## 2022-06-01 DIAGNOSIS — Z7985 Long-term (current) use of injectable non-insulin antidiabetic drugs: Secondary | ICD-10-CM

## 2022-06-01 DIAGNOSIS — E1169 Type 2 diabetes mellitus with other specified complication: Secondary | ICD-10-CM | POA: Diagnosis not present

## 2022-06-01 DIAGNOSIS — E119 Type 2 diabetes mellitus without complications: Secondary | ICD-10-CM | POA: Insufficient documentation

## 2022-06-01 DIAGNOSIS — Z794 Long term (current) use of insulin: Secondary | ICD-10-CM | POA: Diagnosis not present

## 2022-06-01 MED ORDER — WEGOVY 2.4 MG/0.75ML ~~LOC~~ SOAJ
2.4000 mg | SUBCUTANEOUS | 0 refills | Status: DC
  Filled 2022-06-01 – 2022-06-21 (×2): qty 3, 28d supply, fill #0

## 2022-06-13 ENCOUNTER — Other Ambulatory Visit (HOSPITAL_COMMUNITY): Payer: Self-pay

## 2022-06-15 NOTE — Progress Notes (Signed)
Chief Complaint:   OBESITY Travis Owens is here to discuss his progress with his obesity treatment plan along with follow-up of his obesity related diagnoses. Travis Owens is on the Category 4 Plan and states he is following his eating plan approximately 50% of the time. Travis Owens states he is walking for 30 minutes 3 times per week.  Today's visit was #: 49 Starting weight: 220 lbs Starting date: 04/30/2019 Today's weight: 209 lbs Today's date: 06/01/2022 Total lbs lost to date: 11 Total lbs lost since last in-office visit: 2  Interim History: Travis Owens continues to do well with his weight loss. He sometimes struggles with meal planning at times. His hunger is controlled with minimal GI upset on Wegovy.   Subjective:   1. Type 2 diabetes mellitus with other specified complication, with long-term current use of insulin ([name]) Travis Owens is working on his diet and exercise. He has no signs of hypoglycemia.   2. Essential hypertension Travis Owens's blood pressure is stable on his medications. He is at risk of hypotension with his weight loss and some of his medications.   Assessment/Plan:   1. Type 2 diabetes mellitus with other specified complication, with long-term current use of insulin ([name]) Travis Owens is to continue his Wegovy as well as his other medications.   2. Essential hypertension Travis Owens was warned of the risks of hypotension and signs to look for. He will continue to increase his water intake and will continue to monitor.   3. BMI 30.0-30.9,adult  - Semaglutide-Weight Management (WEGOVY) 2.4 MG/0.75ML SOAJ; Inject 2.4 mg into the skin once a week.  Dispense: 3 mL; Refill: 0  4. Obesity, Beginning BMI 33.45 Travis Owens will continue Wegovy, and we will refill for 1 month.   - Semaglutide-Weight Management (WEGOVY) 2.4 MG/0.75ML SOAJ; Inject 2.4 mg into the skin once a week.  Dispense: 3 mL; Refill: 0  Travis Owens is currently in the action stage of change. As such, his goal is to continue with weight loss efforts. He has  agreed to the Category 4 Plan.   Exercise goals: As is.   Behavioral modification strategies: increasing lean protein intake.  Travis Owens has agreed to follow-up with our clinic in 4 weeks. He was informed of the importance of frequent follow-up visits to maximize his success with intensive lifestyle modifications for his multiple health conditions.   Objective:   Blood pressure 115/71, pulse 73, temperature 98.2 F (36.8 C), height 5' 10"$  (1.778 m), weight 209 lb (94.8 kg), SpO2 97 %. Body mass index is 29.99 kg/m.  General: Cooperative, alert, well developed, in no acute distress. HEENT: Conjunctivae and lids unremarkable. Cardiovascular: Regular rhythm.  Lungs: Normal work of breathing. Neurologic: No focal deficits.   Lab Results  Component Value Date   CREATININE 0.86 04/13/2022   BUN 14 04/13/2022   NA 141 04/13/2022   K 4.8 04/13/2022   CL 101 04/13/2022   CO2 25 04/13/2022   Lab Results  Component Value Date   ALT 19 04/13/2022   AST 18 04/13/2022   ALKPHOS 60 04/13/2022   BILITOT 0.3 04/13/2022   Lab Results  Component Value Date   HGBA1C 7.2 (A) 05/06/2022   HGBA1C 6.7 (A) 11/04/2021   HGBA1C 6.1 (A) 04/22/2021   HGBA1C 7.2 (A) 10/21/2020   HGBA1C 10.7 (A) 06/18/2020   Lab Results  Component Value Date   INSULIN 36.0 (H) 01/11/2021   INSULIN 24.2 04/30/2019   Lab Results  Component Value Date   TSH 2.36 05/06/2022  Lab Results  Component Value Date   CHOL 121 04/13/2022   HDL 59 04/13/2022   LDLCALC 31 04/13/2022   TRIG 199 (H) 04/13/2022   CHOLHDL 2.1 04/13/2022   Lab Results  Component Value Date   VD25OH 37.52 05/06/2022   VD25OH 84.2 01/11/2021   VD25OH 25.8 (L) 04/30/2019   Lab Results  Component Value Date   WBC 8.7 09/13/2019   HGB 14.2 09/13/2019   HCT 42.7 09/13/2019   MCV 90.2 09/13/2019   PLT 276.0 09/13/2019   No results found for: "IRON", "TIBC", "FERRITIN"  Attestation Statements:   Reviewed by clinician on day of  visit: allergies, medications, problem list, medical history, surgical history, family history, social history, and previous encounter notes.   I, Trixie Dredge, am acting as transcriptionist for Dennard Nip, MD.  I have reviewed the above documentation for accuracy and completeness, and I agree with the above. -  Dennard Nip, MD

## 2022-06-21 ENCOUNTER — Other Ambulatory Visit: Payer: Self-pay

## 2022-06-21 ENCOUNTER — Other Ambulatory Visit (HOSPITAL_COMMUNITY): Payer: Self-pay

## 2022-06-30 ENCOUNTER — Ambulatory Visit (INDEPENDENT_AMBULATORY_CARE_PROVIDER_SITE_OTHER): Payer: Medicare Other | Admitting: Family Medicine

## 2022-07-07 ENCOUNTER — Ambulatory Visit (INDEPENDENT_AMBULATORY_CARE_PROVIDER_SITE_OTHER): Payer: Medicare Other | Admitting: Family Medicine

## 2022-07-14 ENCOUNTER — Ambulatory Visit (INDEPENDENT_AMBULATORY_CARE_PROVIDER_SITE_OTHER): Payer: 59 | Admitting: Family Medicine

## 2022-07-14 ENCOUNTER — Other Ambulatory Visit (HOSPITAL_COMMUNITY): Payer: Self-pay

## 2022-07-14 ENCOUNTER — Encounter (INDEPENDENT_AMBULATORY_CARE_PROVIDER_SITE_OTHER): Payer: Self-pay | Admitting: Family Medicine

## 2022-07-14 VITALS — BP 100/66 | HR 83 | Temp 97.6°F | Ht 70.0 in | Wt 211.0 lb

## 2022-07-14 DIAGNOSIS — E1165 Type 2 diabetes mellitus with hyperglycemia: Secondary | ICD-10-CM

## 2022-07-14 DIAGNOSIS — Z7985 Long-term (current) use of injectable non-insulin antidiabetic drugs: Secondary | ICD-10-CM | POA: Diagnosis not present

## 2022-07-14 DIAGNOSIS — Z7984 Long term (current) use of oral hypoglycemic drugs: Secondary | ICD-10-CM

## 2022-07-14 DIAGNOSIS — E669 Obesity, unspecified: Secondary | ICD-10-CM

## 2022-07-14 DIAGNOSIS — Z6830 Body mass index (BMI) 30.0-30.9, adult: Secondary | ICD-10-CM | POA: Diagnosis not present

## 2022-07-14 DIAGNOSIS — I10 Essential (primary) hypertension: Secondary | ICD-10-CM

## 2022-07-14 MED ORDER — SEMAGLUTIDE (2 MG/DOSE) 8 MG/3ML ~~LOC~~ SOPN
2.0000 mg | PEN_INJECTOR | SUBCUTANEOUS | 0 refills | Status: DC
  Filled 2022-07-14: qty 3, 28d supply, fill #0

## 2022-07-19 NOTE — Progress Notes (Signed)
Chief Complaint:   OBESITY Travis Owens is here to discuss his progress with his obesity treatment plan along with follow-up of his obesity related diagnoses. Travis Owens is on the Category 4 Plan and states he is following his eating plan approximately 30% of the time. Travis Owens states he is active while doing yard work.    Today's visit was #: 62 Starting weight: 220 lbs Starting date: 04/30/2019 Today's weight: 211 lbs Today's date: 07/14/2022 Total lbs lost to date: 9 Total lbs lost since last in-office visit: 0  Interim History: Travis Owens is retaining a bit of water weight.  He has increased his activity with yard work.  He tripped over a tree root and has bruised his ribs and knee but he is recovering.  Subjective:   1. Type 2 diabetes mellitus with hyperglycemia, without long-term current use of insulin ([name]) Travis Owens is on metformin, Jardiance, and Wegovy.  His last [name] was 7.4.  2. Essential hypertension Earsel's blood pressure is well-controlled with no signs of hypotension.  Assessment/Plan:   1. Type 2 diabetes mellitus with hyperglycemia, without long-term current use of insulin ([name]) Travis Owens agreed to change Wegovy to Ozempic 2 mg once weekly with no refills.  He will get back to his category 4 plan.  - Semaglutide, 2 MG/DOSE, 8 MG/3ML SOPN; Inject 2 mg into the skin as directed once a week.  Dispense: 3 mL; Refill: 0  2. Essential hypertension Pike will continue with his diet and weight loss, and Cozaar.  He may be able to decrease his dose with continued weight loss.  We will continue to monitor and manage.  3. BMI 30.0-30.9,adult  4. Obesity, Beginning BMI 33.45 Travis Owens is currently in the action stage of change. As such, his goal is to continue with weight loss efforts. He has agreed to the Category 4 Plan.   Exercise goals: As tolerated.   Behavioral modification strategies: increasing lean protein intake and meal planning and cooking strategies.  Travis Owens has agreed to follow-up with our  clinic in 4 weeks. He was informed of the importance of frequent follow-up visits to maximize his success with intensive lifestyle modifications for his multiple health conditions.   Objective:   Blood pressure 100/66, pulse 83, temperature 97.6 F (36.4 C), height 5\' 10"  (1.778 m), weight 211 lb (95.7 kg), SpO2 95 %. Body mass index is 30.28 kg/m.  Lab Results  Component Value Date   CREATININE 0.86 04/13/2022   BUN 14 04/13/2022   NA 141 04/13/2022   K 4.8 04/13/2022   CL 101 04/13/2022   CO2 25 04/13/2022   Lab Results  Component Value Date   ALT 19 04/13/2022   AST 18 04/13/2022   ALKPHOS 60 04/13/2022   BILITOT 0.3 04/13/2022   Lab Results  Component Value Date   HGBA1C 7.2 (A) 05/06/2022   HGBA1C 6.7 (A) 11/04/2021   HGBA1C 6.1 (A) 04/22/2021   HGBA1C 7.2 (A) 10/21/2020   HGBA1C 10.7 (A) 06/18/2020   Lab Results  Component Value Date   INSULIN 36.0 (H) 01/11/2021   INSULIN 24.2 04/30/2019   Lab Results  Component Value Date   TSH 2.36 05/06/2022   Lab Results  Component Value Date   CHOL 121 04/13/2022   HDL 59 04/13/2022   LDLCALC 31 04/13/2022   TRIG 199 (H) 04/13/2022   CHOLHDL 2.1 04/13/2022   Lab Results  Component Value Date   VD25OH 37.52 05/06/2022   VD25OH 84.2 01/11/2021   VD25OH 25.8 (L)  04/30/2019   Lab Results  Component Value Date   WBC 8.7 09/13/2019   HGB 14.2 09/13/2019   HCT 42.7 09/13/2019   MCV 90.2 09/13/2019   PLT 276.0 09/13/2019   No results found for: "IRON", "TIBC", "FERRITIN"  Attestation Statements:   Reviewed by clinician on day of visit: allergies, medications, problem list, medical history, surgical history, family history, social history, and previous encounter notes.   I, Trixie Dredge, am acting as transcriptionist for Dennard Nip, MD.  I have reviewed the above documentation for accuracy and completeness, and I agree with the above. -  Dennard Nip, MD

## 2022-07-21 ENCOUNTER — Other Ambulatory Visit (HOSPITAL_COMMUNITY): Payer: Self-pay

## 2022-07-29 ENCOUNTER — Other Ambulatory Visit (HOSPITAL_COMMUNITY): Payer: Self-pay

## 2022-08-11 ENCOUNTER — Encounter (INDEPENDENT_AMBULATORY_CARE_PROVIDER_SITE_OTHER): Payer: Self-pay | Admitting: Family Medicine

## 2022-08-11 ENCOUNTER — Other Ambulatory Visit (HOSPITAL_COMMUNITY): Payer: Self-pay

## 2022-08-11 ENCOUNTER — Telehealth (INDEPENDENT_AMBULATORY_CARE_PROVIDER_SITE_OTHER): Payer: 59 | Admitting: Family Medicine

## 2022-08-11 DIAGNOSIS — E669 Obesity, unspecified: Secondary | ICD-10-CM | POA: Diagnosis not present

## 2022-08-11 DIAGNOSIS — Z6830 Body mass index (BMI) 30.0-30.9, adult: Secondary | ICD-10-CM

## 2022-08-11 DIAGNOSIS — E1169 Type 2 diabetes mellitus with other specified complication: Secondary | ICD-10-CM

## 2022-08-11 DIAGNOSIS — Z7984 Long term (current) use of oral hypoglycemic drugs: Secondary | ICD-10-CM | POA: Diagnosis not present

## 2022-08-11 DIAGNOSIS — E785 Hyperlipidemia, unspecified: Secondary | ICD-10-CM | POA: Diagnosis not present

## 2022-08-11 DIAGNOSIS — E1165 Type 2 diabetes mellitus with hyperglycemia: Secondary | ICD-10-CM | POA: Diagnosis not present

## 2022-08-11 DIAGNOSIS — Z7985 Long-term (current) use of injectable non-insulin antidiabetic drugs: Secondary | ICD-10-CM

## 2022-08-11 MED ORDER — SEMAGLUTIDE (2 MG/DOSE) 8 MG/3ML ~~LOC~~ SOPN
2.0000 mg | PEN_INJECTOR | SUBCUTANEOUS | 0 refills | Status: DC
  Filled 2022-08-11: qty 3, 28d supply, fill #0

## 2022-08-11 NOTE — Progress Notes (Signed)
TeleHealth Visit:  This visit was completed with telemedicine (audio/video) technology. Travis Owens has verbally consented to this TeleHealth visit. The patient is located at home, the provider is located at home. The participants in this visit include the listed provider and patient. The visit was conducted today via MyChart video.  OBESITY Travis Owens is here to discuss his progress with his obesity treatment plan along with follow-up of his obesity related diagnoses.   Today's visit was # 45 Starting weight: 220 lbs Starting date: 04/30/19 Weight at last in office visit: 211 lbs on 07/14/22 Total weight loss: 9 lbs at last in office visit on 07/14/22. Today's reported weight (08/11/22):  206 lbs  Nutrition Plan: the Category 4 plan - 33% adherence.  Current exercise:  yard work, walking some  Interim History:  He feels he has lost a few pounds.  He really wants to get back to following the category 4 plan more closely. His weight goal is 180 pounds.  At one point he was down  to 192 pounds (03/08/21) but has since regained.  He is focusing on protein intake but not following cat 4. He tends to snack at night.  Ozempic- working well for appetite and cravings.   Typical day of food: He mostly eats two meals per day Breakfast- banana and coffee, sometimes oatmeal or Cheerios Lunch: hamburger when eating out without bread, fries sometimes Dinner: eats out a few times per week. Cook at home once or twice per week.    Eating all of the prescribed protein: no Skipping meals: Yes Drinking adequate water: Yes Drinking sugar sweetened beverages: Yes-grape juice mixed with water, about 3 glasses/day. Hunger controlled: well controlled. Cravings controlled:  well controlled.  Assessment/Plan:  1. Type 2 Diabetes Mellitus with hyperglycemia, without long-term current use of insulin HgbA1c is not at goal. Last [name] was 7.2 on 05/06/2022. Medication(s): Ozempic 2 mg weekly, Jardiance 10 mg  daily, metformin 1000 mg twice daily. Eye exam up-to-date-no retinopathy.  On statin and ARB.  Compliant with all medications. Lab Results  Component Value Date   HGBA1C 7.2 (A) 05/06/2022   HGBA1C 6.7 (A) 11/04/2021   HGBA1C 6.1 (A) 04/22/2021   Lab Results  Component Value Date   MICROALBUR 0.7 05/06/2022   LDLCALC 31 04/13/2022   CREATININE 0.86 04/13/2022   Lab Results  Component Value Date   GFR 94.54 09/13/2019   GFR 103.53 09/12/2018   GFR 128.00 12/05/2016    Plan: Refill Ozempic 2 mg weekly. Continue Jardiance 10 mg daily and metformin 1000 mg twice daily. Work on reducing juice and simple carbohydrates in general. Encouraged better plan adherence.   2. Hyperlipidemia associated with type 2 diabetes LDL is at goal.  Most recent lipid panel on 04/13/2022: LDL at goal at 31, triglycerides high at 199, HDL normal at 59. Medication(s): Lipitor 80 mg daily  Lab Results  Component Value Date   CHOL 121 04/13/2022   HDL 59 04/13/2022   LDLCALC 31 04/13/2022   TRIG 199 (H) 04/13/2022   CHOLHDL 2.1 04/13/2022   CHOLHDL 3.2 04/16/2021   CHOLHDL 2.6 10/16/2020   Lab Results  Component Value Date   ALT 19 04/13/2022   AST 18 04/13/2022   ALKPHOS 60 04/13/2022   BILITOT 0.3 04/13/2022   The ASCVD Risk score (Arnett DK, et al., 2019) failed to calculate for the following reasons:   The valid total cholesterol range is 130 to 320 mg/dL  Plan: Continue statin. Increase exercise. Work on better plan  adherence. Avoid simple carbohydrates.  3.Generalized Obesity: Current BMI 30 Pharmacotherapy Plan Travis Owens is currently in the action stage of change. As such, his goal is to continue with weight loss efforts.  He has agreed to the Category 4 plan.  1.  Encouraged him to go back to eating special K protein cereal. 2.  Try Ocean Spray diet juice rather than grape juice.  Exercise goals: walk 30 minutes three times per week. Total gym twice weekly.  Behavioral  modification strategies: increasing lean protein intake, decreasing simple carbohydrates , no meal skipping, meal planning , decrease liquid calories, better snacking choices, and planning for success.  Travis Owens has agreed to follow-up with our clinic in 4 weeks.   No orders of the defined types were placed in this encounter.   There are no discontinued medications.   No orders of the defined types were placed in this encounter.     Objective:   VITALS: Per patient if applicable, see vitals. GENERAL: Alert and in no acute distress. CARDIOPULMONARY: No increased WOB. Speaking in clear sentences.  PSYCH: Pleasant and cooperative. Speech normal rate and rhythm. Affect is appropriate. Insight and judgement are appropriate. Attention is focused, linear, and appropriate.  NEURO: Oriented as arrived to appointment on time with no prompting.   Attestation Statements:   Reviewed by clinician on day of visit: allergies, medications, problem list, medical history, surgical history, family history, social history, and previous encounter notes.   This was prepared with the assistance of Engineer, civil (consulting).  Occasional wrong-word or sound-a-like substitutions may have occurred due to the inherent limitations of voice recognition software.

## 2022-08-22 ENCOUNTER — Other Ambulatory Visit: Payer: Self-pay

## 2022-08-22 ENCOUNTER — Other Ambulatory Visit (HOSPITAL_COMMUNITY): Payer: Self-pay

## 2022-08-23 ENCOUNTER — Other Ambulatory Visit (HOSPITAL_COMMUNITY): Payer: Self-pay

## 2022-09-13 ENCOUNTER — Encounter (INDEPENDENT_AMBULATORY_CARE_PROVIDER_SITE_OTHER): Payer: Self-pay | Admitting: Family Medicine

## 2022-09-13 ENCOUNTER — Ambulatory Visit (INDEPENDENT_AMBULATORY_CARE_PROVIDER_SITE_OTHER): Payer: 59 | Admitting: Family Medicine

## 2022-09-13 ENCOUNTER — Other Ambulatory Visit (HOSPITAL_COMMUNITY): Payer: Self-pay

## 2022-09-13 VITALS — BP 106/63 | HR 80 | Temp 98.0°F | Ht 70.0 in | Wt 211.0 lb

## 2022-09-13 DIAGNOSIS — E1165 Type 2 diabetes mellitus with hyperglycemia: Secondary | ICD-10-CM | POA: Diagnosis not present

## 2022-09-13 DIAGNOSIS — Z7985 Long-term (current) use of injectable non-insulin antidiabetic drugs: Secondary | ICD-10-CM | POA: Diagnosis not present

## 2022-09-13 DIAGNOSIS — L814 Other melanin hyperpigmentation: Secondary | ICD-10-CM | POA: Diagnosis not present

## 2022-09-13 DIAGNOSIS — L989 Disorder of the skin and subcutaneous tissue, unspecified: Secondary | ICD-10-CM | POA: Diagnosis not present

## 2022-09-13 DIAGNOSIS — L821 Other seborrheic keratosis: Secondary | ICD-10-CM | POA: Diagnosis not present

## 2022-09-13 DIAGNOSIS — D492 Neoplasm of unspecified behavior of bone, soft tissue, and skin: Secondary | ICD-10-CM | POA: Diagnosis not present

## 2022-09-13 DIAGNOSIS — E669 Obesity, unspecified: Secondary | ICD-10-CM

## 2022-09-13 DIAGNOSIS — D225 Melanocytic nevi of trunk: Secondary | ICD-10-CM | POA: Diagnosis not present

## 2022-09-13 DIAGNOSIS — Z6830 Body mass index (BMI) 30.0-30.9, adult: Secondary | ICD-10-CM

## 2022-09-13 MED ORDER — SEMAGLUTIDE (2 MG/DOSE) 8 MG/3ML ~~LOC~~ SOPN
2.0000 mg | PEN_INJECTOR | SUBCUTANEOUS | 0 refills | Status: DC
  Filled 2022-09-13: qty 3, 28d supply, fill #0

## 2022-09-14 NOTE — Progress Notes (Signed)
Chief Complaint:   OBESITY Travis Owens is here to discuss his progress with his obesity treatment plan along with follow-up of his obesity related diagnoses. Travis Owens is on the Category 4 Plan and states he is following his eating plan approximately 25% of the time. Travis Owens states he is working in the yard and walking.  Today's visit was #: 46 Starting weight: 220 lbs Starting date: 04/30/2019 Today's weight: 211 lbs Today's date: 09/13/2022 Total lbs lost to date: 9 Total lbs lost since last in-office visit: 0  Interim History: Travis Owens has done well with maintaining his weight. He has struggled to follow his Category 4 plan closely, and he notes some extra temptations but he is working on getting back on track with his eating plan.   Subjective:   1. Type 2 diabetes mellitus with hyperglycemia, without long-term current use of insulin ([name]) Travis Owens is not checking his blood sugars at home. He notes polyphagia has decreased and he is trying to decrease his sugar. He denies nausea or constipation.   Assessment/Plan:   1. Type 2 diabetes mellitus with hyperglycemia, without long-term current use of insulin ([name]) We will refill Ozempic for 1 month. Travis Owens will continue with his diet and exercise, and we will continue to monitor.   - Semaglutide, 2 MG/DOSE, 8 MG/3ML SOPN; Inject 2 mg into the skin as directed once a week.  Dispense: 3 mL; Refill: 0  2. BMI 30.0-30.9,adult  3. Obesity, Beginning BMI 33.45 Travis Owens is currently in the action stage of change. As such, his goal is to continue with weight loss efforts. He has agreed to the Category 4 Plan.   Exercise goals: As is.   Behavioral modification strategies: increasing lean protein intake and increasing water intake.  Travis Owens has agreed to follow-up with our clinic in 4 weeks. He was informed of the importance of frequent follow-up visits to maximize his success with intensive lifestyle modifications for his multiple health conditions.   Objective:    Blood pressure 106/63, pulse 80, temperature 98 F (36.7 C), height 5\' 10"  (1.778 m), weight 211 lb (95.7 kg), SpO2 95 %. Body mass index is 30.28 kg/m.  Lab Results  Component Value Date   CREATININE 0.86 04/13/2022   BUN 14 04/13/2022   NA 141 04/13/2022   K 4.8 04/13/2022   CL 101 04/13/2022   CO2 25 04/13/2022   Lab Results  Component Value Date   ALT 19 04/13/2022   AST 18 04/13/2022   ALKPHOS 60 04/13/2022   BILITOT 0.3 04/13/2022   Lab Results  Component Value Date   HGBA1C 7.2 (A) 05/06/2022   HGBA1C 6.7 (A) 11/04/2021   HGBA1C 6.1 (A) 04/22/2021   HGBA1C 7.2 (A) 10/21/2020   HGBA1C 10.7 (A) 06/18/2020   Lab Results  Component Value Date   INSULIN 36.0 (H) 01/11/2021   INSULIN 24.2 04/30/2019   Lab Results  Component Value Date   TSH 2.36 05/06/2022   Lab Results  Component Value Date   CHOL 121 04/13/2022   HDL 59 04/13/2022   LDLCALC 31 04/13/2022   TRIG 199 (H) 04/13/2022   CHOLHDL 2.1 04/13/2022   Lab Results  Component Value Date   VD25OH 37.52 05/06/2022   VD25OH 84.2 01/11/2021   VD25OH 25.8 (L) 04/30/2019   Lab Results  Component Value Date   WBC 8.7 09/13/2019   HGB 14.2 09/13/2019   HCT 42.7 09/13/2019   MCV 90.2 09/13/2019   PLT 276.0 09/13/2019   No  results found for: "IRON", "TIBC", "FERRITIN"  Attestation Statements:   Reviewed by clinician on day of visit: allergies, medications, problem list, medical history, surgical history, family history, social history, and previous encounter notes.   I, Burt Knack, am acting as transcriptionist for Quillian Quince, MD.  I have reviewed the above documentation for accuracy and completeness, and I agree with the above. -  Quillian Quince, MD

## 2022-09-29 ENCOUNTER — Encounter (INDEPENDENT_AMBULATORY_CARE_PROVIDER_SITE_OTHER): Payer: Self-pay | Admitting: Family Medicine

## 2022-09-29 ENCOUNTER — Telehealth (INDEPENDENT_AMBULATORY_CARE_PROVIDER_SITE_OTHER): Payer: Self-pay | Admitting: Family Medicine

## 2022-09-29 ENCOUNTER — Ambulatory Visit (INDEPENDENT_AMBULATORY_CARE_PROVIDER_SITE_OTHER): Payer: 59 | Admitting: Family Medicine

## 2022-09-29 ENCOUNTER — Other Ambulatory Visit (HOSPITAL_COMMUNITY): Payer: Self-pay

## 2022-09-29 VITALS — BP 105/64 | HR 79 | Temp 98.2°F | Ht 70.0 in | Wt 212.0 lb

## 2022-09-29 DIAGNOSIS — Z6830 Body mass index (BMI) 30.0-30.9, adult: Secondary | ICD-10-CM

## 2022-09-29 DIAGNOSIS — Z7985 Long-term (current) use of injectable non-insulin antidiabetic drugs: Secondary | ICD-10-CM | POA: Diagnosis not present

## 2022-09-29 DIAGNOSIS — E1165 Type 2 diabetes mellitus with hyperglycemia: Secondary | ICD-10-CM | POA: Diagnosis not present

## 2022-09-29 DIAGNOSIS — E669 Obesity, unspecified: Secondary | ICD-10-CM

## 2022-09-29 MED ORDER — SEMAGLUTIDE (2 MG/DOSE) 8 MG/3ML ~~LOC~~ SOPN
2.0000 mg | PEN_INJECTOR | SUBCUTANEOUS | 0 refills | Status: DC
  Filled 2022-09-29 – 2022-10-06 (×3): qty 3, 28d supply, fill #0

## 2022-09-29 NOTE — Telephone Encounter (Signed)
Patient stated he needs an appeal completed for Ozempic

## 2022-09-29 NOTE — Progress Notes (Signed)
[facility]  Office: [phone]  /  Fax: [phone]  WEIGHT SUMMARY AND BIOMETRICS  Anthropometric Measurements Height: 5\' 10"  (1.778 m) Weight: 212 lb (96.2 kg) BMI (Calculated): 30.42 Weight at Last Visit: 211 lb Weight Lost Since Last Visit: 0 Weight Gained Since Last Visit: 1 lb   Body Composition  Body Fat %: 31.4 % Fat Mass (lbs): 66.6 lbs Muscle Mass (lbs): 138.2 lbs Total Body Water (lbs): 103.4 lbs Visceral Fat Rating : 18   Other Clinical Data Fasting: No Labs: No Today's Visit #: 47  Discussed the use of AI scribe software for clinical note transcription with the patient, who gave verbal consent to proceed.  Chief Complaint: OBESITY  Travis Owens is here to discuss his progress with his obesity treatment plan. He is on the the Category 4 Plan and states he is following his eating plan approximately 25 % of the time. He states he is exercising 60 minutes 3 times per week.   History of Present Illness   The patient is a 67 year old male with a history of type 2 diabetes and obesity. His most recent hemoglobin [name], measured in January, was elevated at 7.2, indicating uncontrolled diabetes. Since the last visit, the patient has gained one pound.  The patient reports an incident where he accidentally discarded his Ozempic pen after a single use. He has not missed any doses due to this incident, as he was able to use a dose from his partner's pen.  Regarding his appetite, the patient reports no significant changes and feels that his hunger is well-managed. He recently returned from a week's vacation, during which he indulged in foods such as lasagna. Despite this, he only gained one pound during his vacation.  The patient has been active, doing yard work and walking. He recently pressure washed his [place] house, which he describes as a strenuous activity. He also walked five miles on the beach during his vacation, which he tolerated well with only mild overall soreness. He plans  to continue walking and doing yard work for exercise.  The patient is not aware of any other medications that he might run out of before his next visit.          PHYSICAL EXAM:  Blood pressure 105/64, pulse 79, temperature 98.2 F (36.8 C), height 5\' 10"  (1.778 m), weight 212 lb (96.2 kg), SpO2 95 %. Body mass index is 30.42 kg/m.  DIAGNOSTIC DATA REVIEWED:  BMET    Component Value Date/Time   NA 141 04/13/2022 0947   K 4.8 04/13/2022 0947   CL 101 04/13/2022 0947   CO2 25 04/13/2022 0947   GLUCOSE 132 (H) 04/13/2022 0947   GLUCOSE 141 (H) 09/13/2019 0941   BUN 14 04/13/2022 0947   CREATININE 0.86 04/13/2022 0947   CALCIUM 9.8 04/13/2022 0947   GFRNONAA 106 04/30/2019 1145   GFRAA 122 04/30/2019 1145   Lab Results  Component Value Date   HGBA1C 7.2 (A) 05/06/2022   HGBA1C 9.4 12/05/2016   Lab Results  Component Value Date   INSULIN 36.0 (H) 01/11/2021   INSULIN 24.2 04/30/2019   Lab Results  Component Value Date   TSH 2.36 05/06/2022   CBC    Component Value Date/Time   WBC 8.7 09/13/2019 0941   RBC 4.74 09/13/2019 0941   HGB 14.2 09/13/2019 0941   HGB 14.1 04/30/2019 1145   HCT 42.7 09/13/2019 0941   HCT 42.1 04/30/2019 1145   PLT 276.0 09/13/2019 0941   PLT 273 04/30/2019 1145  MCV 90.2 09/13/2019 0941   MCV 87 04/30/2019 1145   MCH 29.2 04/30/2019 1145   MCHC 33.3 09/13/2019 0941   RDW 15.0 09/13/2019 0941   RDW 13.8 04/30/2019 1145   Iron Studies No results found for: "IRON", "TIBC", "FERRITIN", "IRONPCTSAT" Lipid Panel     Component Value Date/Time   CHOL 121 04/13/2022 0947   TRIG 199 (H) 04/13/2022 0947   HDL 59 04/13/2022 0947   CHOLHDL 2.1 04/13/2022 0947   CHOLHDL 3 09/13/2019 0941   VLDL 33.2 09/13/2019 0941   LDLCALC 31 04/13/2022 0947   Hepatic Function Panel     Component Value Date/Time   PROT 6.7 04/13/2022 0947   ALBUMIN 4.6 04/13/2022 0947   AST 18 04/13/2022 0947   ALT 19 04/13/2022 0947   ALKPHOS 60 04/13/2022  0947   BILITOT 0.3 04/13/2022 0947      Component Value Date/Time   TSH 2.36 05/06/2022 0841   Nutritional Lab Results  Component Value Date   VD25OH 37.52 05/06/2022   VD25OH 84.2 01/11/2021   VD25OH 25.8 (L) 04/30/2019     Assessment and Plan    Type 2 Diabetes Mellitus: Last HbA1c in January was 7.2, indicating uncontrolled diabetes. Patient is on Ozempic 2mg , but accidentally discarded a pen after one use. No missed doses reported. -Refill Ozempic 2mg  today. -Advise patient to inform pharmacy about the accidental disposal for early refill. -Check HbA1c at next visit.  Obesity: Patient gained one pound since last visit. Reports indulging in food during vacation but plans to resume category four eating and increase physical activity. -Encourage continuation of category four eating and physical activity. -Plan for fasting labs at next visit.  General Health Maintenance / Followup Plans: -Return visit in four weeks. -If any medication refills needed before next visit, patient to call office.         I  He was informed of the importance of frequent follow up visits to maximize his success with intensive lifestyle modifications for his multiple health conditions. Return in about 4 weeks (around 10/27/2022).   Quillian Quince, MD

## 2022-09-30 ENCOUNTER — Other Ambulatory Visit (HOSPITAL_COMMUNITY): Payer: Self-pay

## 2022-10-04 ENCOUNTER — Other Ambulatory Visit (HOSPITAL_COMMUNITY): Payer: Self-pay

## 2022-10-06 ENCOUNTER — Other Ambulatory Visit (HOSPITAL_COMMUNITY): Payer: Self-pay

## 2022-10-11 ENCOUNTER — Other Ambulatory Visit (HOSPITAL_COMMUNITY): Payer: Self-pay

## 2022-10-12 ENCOUNTER — Ambulatory Visit (INDEPENDENT_AMBULATORY_CARE_PROVIDER_SITE_OTHER): Payer: Medicare Other | Admitting: Family Medicine

## 2022-10-14 ENCOUNTER — Other Ambulatory Visit (HOSPITAL_COMMUNITY): Payer: Self-pay

## 2022-10-14 ENCOUNTER — Telehealth: Payer: Self-pay | Admitting: Cardiology

## 2022-10-14 DIAGNOSIS — E1169 Type 2 diabetes mellitus with other specified complication: Secondary | ICD-10-CM

## 2022-10-14 DIAGNOSIS — I2510 Atherosclerotic heart disease of native coronary artery without angina pectoris: Secondary | ICD-10-CM

## 2022-10-14 MED ORDER — REPATHA SURECLICK 140 MG/ML ~~LOC~~ SOAJ
1.0000 mL | SUBCUTANEOUS | 3 refills | Status: DC
  Filled 2022-10-14 – 2022-11-14 (×2): qty 6, 84d supply, fill #0
  Filled 2023-03-15: qty 6, 84d supply, fill #1
  Filled 2023-06-08 (×2): qty 6, 84d supply, fill #2
  Filled 2023-09-04: qty 6, 84d supply, fill #3

## 2022-10-14 NOTE — Telephone Encounter (Signed)
*  STAT* If patient is at the pharmacy, call can be transferred to refill team.   1. Which medications need to be refilled? (please list name of each medication and dose if known)   Evolocumab (REPATHA SURECLICK) 140 MG/ML SOAJ   2. Which pharmacy/location (including street and city if local pharmacy) is medication to be sent to?  [place] [facility]   3. Do they need a 30 day or 90 day supply?   90 day  Patient still has some of this medication.  Patient has appointment scheduled on 10/9.

## 2022-10-14 NOTE — Telephone Encounter (Signed)
Refill sent in

## 2022-10-18 ENCOUNTER — Encounter (INDEPENDENT_AMBULATORY_CARE_PROVIDER_SITE_OTHER): Payer: Self-pay

## 2022-10-21 DIAGNOSIS — E119 Type 2 diabetes mellitus without complications: Secondary | ICD-10-CM | POA: Diagnosis not present

## 2022-10-21 DIAGNOSIS — H2513 Age-related nuclear cataract, bilateral: Secondary | ICD-10-CM | POA: Diagnosis not present

## 2022-10-21 LAB — HM DIABETES EYE EXAM

## 2022-10-25 ENCOUNTER — Other Ambulatory Visit (HOSPITAL_COMMUNITY): Payer: Self-pay

## 2022-11-03 ENCOUNTER — Encounter (INDEPENDENT_AMBULATORY_CARE_PROVIDER_SITE_OTHER): Payer: Self-pay | Admitting: Family Medicine

## 2022-11-03 ENCOUNTER — Ambulatory Visit (INDEPENDENT_AMBULATORY_CARE_PROVIDER_SITE_OTHER): Payer: 59 | Admitting: Family Medicine

## 2022-11-03 ENCOUNTER — Other Ambulatory Visit (HOSPITAL_COMMUNITY): Payer: Self-pay

## 2022-11-03 VITALS — BP 117/69 | HR 79 | Temp 98.0°F | Ht 70.0 in | Wt 212.0 lb

## 2022-11-03 DIAGNOSIS — E538 Deficiency of other specified B group vitamins: Secondary | ICD-10-CM

## 2022-11-03 DIAGNOSIS — E1169 Type 2 diabetes mellitus with other specified complication: Secondary | ICD-10-CM

## 2022-11-03 DIAGNOSIS — Z6830 Body mass index (BMI) 30.0-30.9, adult: Secondary | ICD-10-CM | POA: Diagnosis not present

## 2022-11-03 DIAGNOSIS — E669 Obesity, unspecified: Secondary | ICD-10-CM | POA: Diagnosis not present

## 2022-11-03 DIAGNOSIS — E1165 Type 2 diabetes mellitus with hyperglycemia: Secondary | ICD-10-CM | POA: Diagnosis not present

## 2022-11-03 DIAGNOSIS — Z7984 Long term (current) use of oral hypoglycemic drugs: Secondary | ICD-10-CM | POA: Diagnosis not present

## 2022-11-03 DIAGNOSIS — Z7985 Long-term (current) use of injectable non-insulin antidiabetic drugs: Secondary | ICD-10-CM | POA: Diagnosis not present

## 2022-11-03 DIAGNOSIS — E785 Hyperlipidemia, unspecified: Secondary | ICD-10-CM

## 2022-11-03 DIAGNOSIS — E559 Vitamin D deficiency, unspecified: Secondary | ICD-10-CM | POA: Diagnosis not present

## 2022-11-03 MED ORDER — SEMAGLUTIDE (2 MG/DOSE) 8 MG/3ML ~~LOC~~ SOPN
2.0000 mg | PEN_INJECTOR | SUBCUTANEOUS | 0 refills | Status: DC
  Filled 2022-11-03: qty 3, 28d supply, fill #0

## 2022-11-03 NOTE — Progress Notes (Signed)
.smr  Office: [phone]  /  Fax: [phone]  WEIGHT SUMMARY AND BIOMETRICS  Anthropometric Measurements Height: 5\' 10"  (1.778 m) Weight: 212 lb (96.2 kg) BMI (Calculated): 30.42 Weight at Last Visit: 212 lb Weight Lost Since Last Visit: 0 Weight Gained Since Last Visit: 0 Starting Weight: 220 lb Total Weight Loss (lbs): 8 lb (3.629 kg)   Body Composition  Body Fat %: 31.9 % Fat Mass (lbs): 67.6 lbs Muscle Mass (lbs): 137.2 lbs Total Body Water (lbs): 104.2 lbs Visceral Fat Rating : 18   Other Clinical Data Fasting: No Labs: No Today's Visit #: 48 Starting Date: 04/30/19    Chief Complaint: OBESITY   History of Present Illness   The patient is a 67 year old male with a history of obesity and diabetes. He reports maintaining his weight over the past month and has been adhering to a category four diet plan approximately 25% of the time. He has also been increasing his physical activity, specifically walking for 30 minutes, five times per week.  During a recent vacation, the patient managed to maintain his weight, attributing this to a focus on protein intake and home-cooked meals. He admits to indulging in some sweets, which he believes may have negatively impacted his weight loss efforts. Despite this, he reports a lack of significant hunger and denies skipping meals.  The patient is currently on Ozempic, Jardiance, and Metformin for diabetes management, all of which he tolerates well. He also takes Lipitor and Zetia for cholesterol management. He reports no issues with lightheadedness or dizziness.  In terms of exercise, the patient has been walking regularly, mostly on level ground, but plans to incorporate hill walking into his routine. He also expresses an intention to use a [employer] for upper body workouts.  The patient acknowledges the need to adhere more closely to his meal plan, having noticed a correlation between strict adherence and significant weight loss  in the past. He expresses a commitment to improving his diet and continuing his exercise regimen.          PHYSICAL EXAM:  Blood pressure 117/69, pulse 79, temperature 98 F (36.7 C), height 5\' 10"  (1.778 m), weight 212 lb (96.2 kg), SpO2 96%. Body mass index is 30.42 kg/m.  DIAGNOSTIC DATA REVIEWED:  BMET    Component Value Date/Time   NA 141 04/13/2022 0947   K 4.8 04/13/2022 0947   CL 101 04/13/2022 0947   CO2 25 04/13/2022 0947   GLUCOSE 132 (H) 04/13/2022 0947   GLUCOSE 141 (H) 09/13/2019 0941   BUN 14 04/13/2022 0947   CREATININE 0.86 04/13/2022 0947   CALCIUM 9.8 04/13/2022 0947   GFRNONAA 106 04/30/2019 1145   GFRAA 122 04/30/2019 1145   Lab Results  Component Value Date   HGBA1C 7.2 (A) 05/06/2022   HGBA1C 9.4 12/05/2016   Lab Results  Component Value Date   INSULIN 36.0 (H) 01/11/2021   INSULIN 24.2 04/30/2019   Lab Results  Component Value Date   TSH 2.36 05/06/2022   CBC    Component Value Date/Time   WBC 8.7 09/13/2019 0941   RBC 4.74 09/13/2019 0941   HGB 14.2 09/13/2019 0941   HGB 14.1 04/30/2019 1145   HCT 42.7 09/13/2019 0941   HCT 42.1 04/30/2019 1145   PLT 276.0 09/13/2019 0941   PLT 273 04/30/2019 1145   MCV 90.2 09/13/2019 0941   MCV 87 04/30/2019 1145   MCH 29.2 04/30/2019 1145   MCHC 33.3 09/13/2019 0941   RDW  15.0 09/13/2019 0941   RDW 13.8 04/30/2019 1145   Iron Studies No results found for: "IRON", "TIBC", "FERRITIN", "IRONPCTSAT" Lipid Panel     Component Value Date/Time   CHOL 121 04/13/2022 0947   TRIG 199 (H) 04/13/2022 0947   HDL 59 04/13/2022 0947   CHOLHDL 2.1 04/13/2022 0947   CHOLHDL 3 09/13/2019 0941   VLDL 33.2 09/13/2019 0941   LDLCALC 31 04/13/2022 0947   Hepatic Function Panel     Component Value Date/Time   PROT 6.7 04/13/2022 0947   ALBUMIN 4.6 04/13/2022 0947   AST 18 04/13/2022 0947   ALT 19 04/13/2022 0947   ALKPHOS 60 04/13/2022 0947   BILITOT 0.3 04/13/2022 0947      Component Value  Date/Time   TSH 2.36 05/06/2022 0841   Nutritional Lab Results  Component Value Date   VD25OH 37.52 05/06/2022   VD25OH 84.2 01/11/2021   VD25OH 25.8 (L) 04/30/2019     Assessment and Plan    Obesity: Stable weight despite recent vacation. Adherence to diet plan is inconsistent. Regular walking exercise regimen established. -Encourage adherence to meal plan and continuation of walking exercise. -Consider adding upper body strength training with [employer].  Type 2 Diabetes: On Ozempic, Jardiance, and Metformin with no reported side effects. -Continue current medications. -Check hemoglobin [name], insulin, and fasting glucose today.  Hyperlipidemia: On Lipitor 80mg  and Zetia with no reported side effects. -Continue current medications. -Check cholesterol today.  Vitamin B12 Deficiency: On B12 supplements. -Continue B12 supplements. -Check B12 level today.  Vitamin D Deficiency: Previous low levels. -Check Vitamin D level today.  General Health Maintenance: -Check urine microalbumin today. -Schedule follow-up appointment in 4 weeks.       He was informed of the importance of frequent follow up visits to maximize his success with intensive lifestyle modifications for his multiple health conditions.    Quillian Quince, MD

## 2022-11-04 ENCOUNTER — Ambulatory Visit (INDEPENDENT_AMBULATORY_CARE_PROVIDER_SITE_OTHER): Payer: 59 | Admitting: Family Medicine

## 2022-11-04 ENCOUNTER — Other Ambulatory Visit (HOSPITAL_COMMUNITY): Payer: Self-pay

## 2022-11-04 ENCOUNTER — Encounter: Payer: Self-pay | Admitting: Family Medicine

## 2022-11-04 VITALS — BP 107/69 | HR 79 | Temp 97.4°F | Ht 70.0 in | Wt 216.6 lb

## 2022-11-04 DIAGNOSIS — E1165 Type 2 diabetes mellitus with hyperglycemia: Secondary | ICD-10-CM | POA: Diagnosis not present

## 2022-11-04 DIAGNOSIS — Z7984 Long term (current) use of oral hypoglycemic drugs: Secondary | ICD-10-CM

## 2022-11-04 DIAGNOSIS — F325 Major depressive disorder, single episode, in full remission: Secondary | ICD-10-CM

## 2022-11-04 DIAGNOSIS — Z7985 Long-term (current) use of injectable non-insulin antidiabetic drugs: Secondary | ICD-10-CM | POA: Diagnosis not present

## 2022-11-04 DIAGNOSIS — E1159 Type 2 diabetes mellitus with other circulatory complications: Secondary | ICD-10-CM

## 2022-11-04 DIAGNOSIS — I152 Hypertension secondary to endocrine disorders: Secondary | ICD-10-CM

## 2022-11-04 LAB — CMP14+EGFR
ALT: 27 IU/L (ref 0–44)
AST: 19 IU/L (ref 0–40)
Albumin: 4.6 g/dL (ref 3.9–4.9)
Alkaline Phosphatase: 61 IU/L (ref 44–121)
BUN/Creatinine Ratio: 20 (ref 10–24)
BUN: 15 mg/dL (ref 8–27)
Bilirubin Total: 0.4 mg/dL (ref 0.0–1.2)
CO2: 24 mmol/L (ref 20–29)
Calcium: 9.3 mg/dL (ref 8.6–10.2)
Chloride: 103 mmol/L (ref 96–106)
Creatinine, Ser: 0.76 mg/dL (ref 0.76–1.27)
Globulin, Total: 2.2 g/dL (ref 1.5–4.5)
Glucose: 169 mg/dL — ABNORMAL HIGH (ref 70–99)
Potassium: 5.5 mmol/L — ABNORMAL HIGH (ref 3.5–5.2)
Sodium: 141 mmol/L (ref 134–144)
Total Protein: 6.8 g/dL (ref 6.0–8.5)
eGFR: 99 mL/min/{1.73_m2} (ref >59)

## 2022-11-04 LAB — LIPID PANEL WITH LDL/HDL RATIO
Cholesterol, Total: 106 mg/dL (ref 100–199)
HDL: 57 mg/dL (ref >39)
LDL Chol Calc (NIH): 29 mg/dL (ref 0–99)
LDL/HDL Ratio: 0.5 ratio (ref 0.0–3.6)
Triglycerides: 110 mg/dL (ref 0–149)
VLDL Cholesterol Cal: 20 mg/dL (ref 5–40)

## 2022-11-04 LAB — MICROALBUMIN / CREATININE URINE RATIO
Creatinine, Urine: 74.6 mg/dL
Microalb/Creat Ratio: 4 mg/g creat (ref 0–29)
Microalbumin, Urine: 3.2 ug/mL

## 2022-11-04 LAB — HEMOGLOBIN A1C
Est. average glucose Bld gHb Est-mCnc: 194 mg/dL
Hgb A1c MFr Bld: 8.4 % — ABNORMAL HIGH (ref 4.8–5.6)

## 2022-11-04 LAB — VITAMIN B12: Vitamin B-12: 1173 pg/mL (ref 232–1245)

## 2022-11-04 LAB — VITAMIN D 25 HYDROXY (VIT D DEFICIENCY, FRACTURES): Vit D, 25-Hydroxy: 42.9 ng/mL (ref 30.0–100.0)

## 2022-11-04 LAB — INSULIN, RANDOM: INSULIN: 23.9 u[IU]/mL (ref 2.6–24.9)

## 2022-11-04 MED ORDER — ESCITALOPRAM OXALATE 20 MG PO TABS
20.0000 mg | ORAL_TABLET | Freq: Every day | ORAL | 3 refills | Status: DC
  Filled 2022-11-04: qty 90, 90d supply, fill #0
  Filled 2023-02-27: qty 90, 90d supply, fill #1
  Filled 2023-06-08: qty 90, 90d supply, fill #2
  Filled 2023-09-13 (×2): qty 90, 90d supply, fill #3

## 2022-11-04 NOTE — Patient Instructions (Signed)
It was very nice to see you today!  Please keep working on diet and exercise.  Will see you back in 6 months.  Come back sooner if needed.  Return in about 6 months (around 05/07/2023) for Annual Physical.   Take care, Dr Jimmey Ralph  PLEASE NOTE:  If you had any lab tests, please let us know if you have not heard back within a few days. You may see your results on mychart before we have a chance to review them but we will give you a call once they are reviewed by [place].   If we ordered any referrals today, please let us know if you have not heard from their office within the next week.   If you had any urgent prescriptions sent in today, please check with the pharmacy within an hour of our visit to make sure the prescription was transmitted appropriately.   Please try these tips to maintain a healthy lifestyle:  Eat at least 3 REAL meals and 1-2 snacks per day.  Aim for no more than 5 hours between eating.  If you eat breakfast, please do so within one hour of getting up.   Each meal should contain half fruits/vegetables, one quarter protein, and one quarter carbs (no bigger than a computer mouse)  Cut down on sweet beverages. This includes juice, soda, and sweet tea.   Drink at least 1 glass of water with each meal and aim for at least 8 glasses per day  Exercise at least 150 minutes every week.

## 2022-11-04 NOTE — Assessment & Plan Note (Signed)
Stable on Lexapro 20 mg daily.  Will refill today.

## 2022-11-04 NOTE — Assessment & Plan Note (Signed)
Last [name] 8.4.  We discussed switching Ozempic to [facility] however he deferred for now.  He would like to continue with Ozempic 2 mg weekly.  He will also continue metformin 1000 mg twice daily and Jardiance 10 mg daily.  He will work on lifestyle modifications.  He would like to try this before making any medication changes.  Will recheck [name] in 3 to 6 months.  If still elevated we will switch GLP-1 agonist or potentially increase his dose of Jardiance.

## 2022-11-04 NOTE — Assessment & Plan Note (Signed)
Blood pressure at goal today on losartan 25 mg daily, spironolactone 12.5 mg daily, and metoprolol succinate 150 mg daily.

## 2022-11-04 NOTE — Progress Notes (Signed)
   Travis Owens is a 67 y.o. male who presents today for an office visit.  Assessment/Plan:  Chronic Problems Addressed Today: Type 2 diabetes mellitus with hyperglycemia, without long-term current use of insulin ([name]) Last [name] 8.4.  We discussed switching Ozempic to [facility] however he deferred for now.  He would like to continue with Ozempic 2 mg weekly.  He will also continue metformin 1000 mg twice daily and Jardiance 10 mg daily.  He will work on lifestyle modifications.  He would like to try this before making any medication changes.  Will recheck [name] in 3 to 6 months.  If still elevated we will switch GLP-1 agonist or potentially increase his dose of Jardiance.  Depression, major, single episode, complete remission ([name]) Stable on Lexapro 20 mg daily.  Will refill today.  Hypertension associated with diabetes ([name]) Blood pressure at goal today on losartan 25 mg daily, spironolactone 12.5 mg daily, and metoprolol succinate 150 mg daily.    Subjective:  HPI:  See A/P for status of chronic conditions.  Patient is here today for follow-up.  Last seen 6 months ago for annual physical.  At that time he was doing well.  [name] was 7.1.  We continued him on metformin 1000 mg twice daily, Jardiance 10 mg daily and semaglutide 2.4 mg weekly.  Since her last visit he has been following with medical weight management.  He last saw them yesterday.  [name] was elevated 8.4.  He does admit to some dietary indiscretions over the last week or so       Objective:  Physical Exam: BP 107/69   Pulse 79   Temp (!) 97.4 F (36.3 C) (Temporal)   Ht 5\' 10"  (1.778 m)   Wt 216 lb 9.6 oz (98.2 kg)   SpO2 97%   BMI 31.08 kg/m   Wt Readings from Last 3 Encounters:  11/04/22 216 lb 9.6 oz (98.2 kg)  11/03/22 212 lb (96.2 kg)  09/29/22 212 lb (96.2 kg)   Gen: No acute distress, resting comfortably CV: Regular rate and rhythm with no murmurs appreciated Pulm: Normal work of breathing, clear to  auscultation bilaterally with no crackles, wheezes, or rhonchi Neuro: Grossly normal, moves all extremities Psych: Normal affect and thought content      [name] M. Jimmey Ralph, MD 11/04/2022 10:23 AM

## 2022-11-11 ENCOUNTER — Other Ambulatory Visit: Payer: Self-pay

## 2022-11-14 ENCOUNTER — Other Ambulatory Visit (HOSPITAL_COMMUNITY): Payer: Self-pay

## 2022-11-15 ENCOUNTER — Other Ambulatory Visit: Payer: Self-pay

## 2022-11-29 DIAGNOSIS — U071 COVID-19: Secondary | ICD-10-CM | POA: Diagnosis not present

## 2022-11-29 DIAGNOSIS — E669 Obesity, unspecified: Secondary | ICD-10-CM | POA: Diagnosis not present

## 2022-11-29 DIAGNOSIS — Z6831 Body mass index (BMI) 31.0-31.9, adult: Secondary | ICD-10-CM | POA: Diagnosis not present

## 2022-11-29 DIAGNOSIS — R030 Elevated blood-pressure reading, without diagnosis of hypertension: Secondary | ICD-10-CM | POA: Diagnosis not present

## 2022-11-30 ENCOUNTER — Other Ambulatory Visit: Payer: Self-pay | Admitting: Cardiology

## 2022-11-30 ENCOUNTER — Other Ambulatory Visit: Payer: Self-pay

## 2022-11-30 ENCOUNTER — Other Ambulatory Visit (HOSPITAL_COMMUNITY): Payer: Self-pay

## 2022-11-30 MED ORDER — SPIRONOLACTONE 25 MG PO TABS
12.5000 mg | ORAL_TABLET | Freq: Every day | ORAL | 1 refills | Status: DC
Start: 2022-11-30 — End: 2023-06-08
  Filled 2022-11-30: qty 21, 42d supply, fill #0
  Filled 2022-11-30: qty 24, 48d supply, fill #0
  Filled 2022-11-30: qty 45, 90d supply, fill #0
  Filled 2023-02-14: qty 45, 90d supply, fill #1

## 2022-11-30 NOTE — Progress Notes (Signed)
TeleHealth Visit:  This visit was completed with telemedicine (audio/video) technology. Robt has verbally consented to this TeleHealth visit. The patient is located at home, the provider is located at home. The participants in this visit include the listed provider and patient. The visit was conducted today via MyChart video.  OBESITY Pius is here to discuss his progress with his obesity treatment plan along with follow-up of his obesity related diagnoses.   Today's visit was # 49 Starting weight: 220 lbs Starting date: 04/30/19 Weight at last in office visit: 212 lbs on 11/03/22 Total weight loss: 8 lbs at last in office visit on 11/03/22. Today's reported weight (12/01/22): none reported  Nutrition Plan: the Category 4 plan   Current exercise:  walking for 30-45 minutes, 3-5 times per week.   Interim History:  Not following a plan currently. Has COVID.  He and his PCP discussed optimal weight and decided on 185 pounds. He would like to start doing better with weight loss but he does not feel that he is getting any help from the Ozempic 2 mg as far as appetite and cravings.  Assessment/Plan:  We discussed recent lab results in depth.  1. Type 2 Diabetes Mellitus with hyperglycemia, without long-term current use of insulin HgbA1c is not at goal. Last [name] was 8.4, up from 7.2 in January 2024. Medication(s): Ozempic 2 mg SQ weekly, metformin 1000 mg twice daily, empagliflozin 10 mg daily. Microalbumin was normal.  LDL at goal. Appetite and cravings are not well-controlled with Ozempic 2 mg.  Lab Results  Component Value Date   HGBA1C 8.4 (H) 11/03/2022   HGBA1C 7.2 (A) 05/06/2022   HGBA1C 6.7 (A) 11/04/2021   Lab Results  Component Value Date   MICROALBUR 0.7 05/06/2022   LDLCALC 29 11/03/2022   CREATININE 0.76 11/03/2022   Lab Results  Component Value Date   GFR 94.54 09/13/2019   GFR 103.53 09/12/2018   GFR 128.00 12/05/2016    Plan: Start Mounjaro 10 mg SQ  weekly Continue empagliflozin 10 mg daily and metformin 1000 mg twice daily. Discontinue Ozempic.  2. Vitamin D Deficiency Vitamin D is not at goal of 50.  Most recent vitamin D level was 42.  He says he has been off of vitamin D supplementation since he had a high reading-this was back in 2022..  Lab Results  Component Value Date   VD25OH 42.9 11/03/2022   VD25OH 37.52 05/06/2022   VD25OH 84.2 01/11/2021    Plan: Start OTC vitamin D3 2000 IU daily   3. Morbid Obesity: Current BMI 30  Nathanal is currently in the action stage of change. As such, his goal is to continue with weight loss efforts.  He has agreed to the Category 4 plan.  Exercise goals: Walking as tolerated due to COVID.  Slowly build back up to longer walks.  Behavioral modification strategies: increasing lean protein intake, decreasing simple carbohydrates , and planning for success.  Kert has agreed to follow-up with our clinic in 4 weeks.  No orders of the defined types were placed in this encounter.   Medications Discontinued During This Encounter  Medication Reason   Semaglutide, 2 MG/DOSE, 8 MG/3ML SOPN Change in therapy     Meds ordered this encounter  Medications   tirzepatide (MOUNJARO) 10 MG/0.5ML Pen    Sig: Inject 10 mg into the skin once a week.    Dispense:  2 mL    Refill:  0    Order Specific Question:   Supervising  Provider    Answer:   Carolin Sicks   Cholecalciferol (VITAMIN D) 50 MCG (2000 UT) CAPS    Sig: Take 1 capsule (2,000 Units total) by mouth daily.    Dispense:  30 capsule    Refill:  0    Order Specific Question:   Supervising Provider    Answer:   Glennis Brink [[id number]]      Objective:   VITALS: Per patient if applicable, see vitals. GENERAL: Alert and in no acute distress. CARDIOPULMONARY: No increased WOB. Speaking in clear sentences.  PSYCH: Pleasant and cooperative. Speech normal rate and rhythm. Affect is appropriate. Insight and judgement are appropriate.  Attention is focused, linear, and appropriate.  NEURO: Oriented as arrived to appointment on time with no prompting.   Attestation Statements:   Reviewed by clinician on day of visit: allergies, medications, problem list, medical history, surgical history, family history, social history, and previous encounter notes.   This was prepared with the assistance of Engineer, civil (consulting).  Occasional wrong-word or sound-a-like substitutions may have occurred due to the inherent limitations of voice recognition software.

## 2022-12-01 ENCOUNTER — Other Ambulatory Visit (HOSPITAL_COMMUNITY): Payer: Self-pay

## 2022-12-01 ENCOUNTER — Encounter (INDEPENDENT_AMBULATORY_CARE_PROVIDER_SITE_OTHER): Payer: Self-pay | Admitting: Family Medicine

## 2022-12-01 ENCOUNTER — Telehealth: Payer: Self-pay

## 2022-12-01 ENCOUNTER — Telehealth (INDEPENDENT_AMBULATORY_CARE_PROVIDER_SITE_OTHER): Payer: 59 | Admitting: Family Medicine

## 2022-12-01 DIAGNOSIS — E559 Vitamin D deficiency, unspecified: Secondary | ICD-10-CM

## 2022-12-01 DIAGNOSIS — E1165 Type 2 diabetes mellitus with hyperglycemia: Secondary | ICD-10-CM | POA: Diagnosis not present

## 2022-12-01 DIAGNOSIS — Z7985 Long-term (current) use of injectable non-insulin antidiabetic drugs: Secondary | ICD-10-CM

## 2022-12-01 DIAGNOSIS — Z6830 Body mass index (BMI) 30.0-30.9, adult: Secondary | ICD-10-CM | POA: Diagnosis not present

## 2022-12-01 DIAGNOSIS — Z7984 Long term (current) use of oral hypoglycemic drugs: Secondary | ICD-10-CM | POA: Diagnosis not present

## 2022-12-01 DIAGNOSIS — E669 Obesity, unspecified: Secondary | ICD-10-CM

## 2022-12-01 MED ORDER — TIRZEPATIDE 10 MG/0.5ML ~~LOC~~ SOAJ
10.0000 mg | SUBCUTANEOUS | 0 refills | Status: DC
Start: 2022-12-01 — End: 2022-12-29
  Filled 2022-12-01: qty 2, 28d supply, fill #0

## 2022-12-01 MED ORDER — VITAMIN D 50 MCG (2000 UT) PO CAPS
1.0000 | ORAL_CAPSULE | Freq: Every day | ORAL | 0 refills | Status: AC
Start: 2022-12-01 — End: ?

## 2022-12-01 NOTE — Telephone Encounter (Signed)
PA submitted through Cover My Meds for [facility]. Awaiting insurance determination. Key: [id number]

## 2022-12-02 ENCOUNTER — Other Ambulatory Visit (HOSPITAL_COMMUNITY): Payer: Self-pay

## 2022-12-13 ENCOUNTER — Other Ambulatory Visit (HOSPITAL_COMMUNITY): Payer: Self-pay

## 2022-12-29 ENCOUNTER — Telehealth: Payer: Self-pay

## 2022-12-29 ENCOUNTER — Ambulatory Visit (INDEPENDENT_AMBULATORY_CARE_PROVIDER_SITE_OTHER): Payer: 59 | Admitting: Family Medicine

## 2022-12-29 ENCOUNTER — Other Ambulatory Visit (HOSPITAL_COMMUNITY): Payer: Self-pay

## 2022-12-29 ENCOUNTER — Encounter (INDEPENDENT_AMBULATORY_CARE_PROVIDER_SITE_OTHER): Payer: Self-pay | Admitting: Family Medicine

## 2022-12-29 VITALS — BP 113/65 | HR 91 | Temp 98.2°F | Ht 70.0 in | Wt 213.0 lb

## 2022-12-29 DIAGNOSIS — Z6830 Body mass index (BMI) 30.0-30.9, adult: Secondary | ICD-10-CM | POA: Diagnosis not present

## 2022-12-29 DIAGNOSIS — Z7985 Long-term (current) use of injectable non-insulin antidiabetic drugs: Secondary | ICD-10-CM

## 2022-12-29 DIAGNOSIS — F3289 Other specified depressive episodes: Secondary | ICD-10-CM

## 2022-12-29 DIAGNOSIS — E669 Obesity, unspecified: Secondary | ICD-10-CM | POA: Diagnosis not present

## 2022-12-29 DIAGNOSIS — F5089 Other specified eating disorder: Secondary | ICD-10-CM

## 2022-12-29 DIAGNOSIS — E1165 Type 2 diabetes mellitus with hyperglycemia: Secondary | ICD-10-CM

## 2022-12-29 DIAGNOSIS — F32A Depression, unspecified: Secondary | ICD-10-CM | POA: Insufficient documentation

## 2022-12-29 MED ORDER — TIRZEPATIDE 10 MG/0.5ML ~~LOC~~ SOAJ
10.0000 mg | SUBCUTANEOUS | 0 refills | Status: DC
  Filled 2022-12-29: qty 2, 28d supply, fill #0

## 2022-12-29 NOTE — Telephone Encounter (Signed)
Key: [id number] MOUNJARO 10  MG/0.5 ML PEN  Prior Authorization is not required for this medication dosage form and strength at the quantity and days supply requested. This medication does not require a prior authorization because it is a covered benefit.

## 2022-12-29 NOTE — Progress Notes (Signed)
Chief Complaint:   OBESITY Travis Owens is here to discuss his progress with his obesity treatment plan along with follow-up of his obesity related diagnoses. Travis Owens is on the Category 4 Plan and states he is following his eating plan approximately 25% of the time. Travis Owens states he is walking for 30 minutes 3 times per week.  Today's visit was #: 50 Starting weight: 220 lbs Starting date: 04/30/2019 Today's weight: 213 lbs Today's date: 12/29/2022 Total lbs lost to date: 7 Total lbs lost since last in-office visit: 0  Interim History: Patient has struggled with following his eating plan. He is not always able to eat all of his food, but he is trying to increase his protein. He is doing cardio and strengthening for exercise.   Subjective:   1. Type 2 diabetes mellitus with hyperglycemia, without long-term current use of insulin ([name]) Patient is doing well on Mounjaro and he denies nausea, vomiting, or constipation.   2. Emotional Eating Behavior Patient is on Lexapro and he is working on decreasing emotional eating behavior.   Assessment/Plan:   1. Type 2 diabetes mellitus with hyperglycemia, without long-term current use of insulin ([name]) Patient will continue with his diet and Mounjaro, and we will refill Mounjaro 10 mg once weekly for 1 month.   - tirzepatide (MOUNJARO) 10 MG/0.5ML Pen; Inject 10 mg into the skin once a week.  Dispense: 2 mL; Refill: 0  2. Emotional Eating Behavior Patient is to work on decreasing emotional eating behavior, and he will continue Lexapro as is.   3. BMI 30.0-30.9,adult  4. Obesity, Beginning BMI 33.45 Travis Owens is currently in the action stage of change. As such, his goal is to continue with weight loss efforts. He has agreed to the Category 4 Plan.   Exercise goals: As is.   Behavioral modification strategies: no skipping meals.  Travis Owens has agreed to follow-up with our clinic in 4 weeks. He was informed of the importance of frequent follow-up visits to  maximize his success with intensive lifestyle modifications for his multiple health conditions.   Objective:   Blood pressure 113/65, pulse 91, temperature 98.2 F (36.8 C), height 5\' 10"  (1.778 m), weight 213 lb (96.6 kg), SpO2 100%. Body mass index is 30.56 kg/m.  Lab Results  Component Value Date   CREATININE 0.76 11/03/2022   BUN 15 11/03/2022   NA 141 11/03/2022   K 5.5 (H) 11/03/2022   CL 103 11/03/2022   CO2 24 11/03/2022   Lab Results  Component Value Date   ALT 27 11/03/2022   AST 19 11/03/2022   ALKPHOS 61 11/03/2022   BILITOT 0.4 11/03/2022   Lab Results  Component Value Date   HGBA1C 8.4 (H) 11/03/2022   HGBA1C 7.2 (A) 05/06/2022   HGBA1C 6.7 (A) 11/04/2021   HGBA1C 6.1 (A) 04/22/2021   HGBA1C 7.2 (A) 10/21/2020   Lab Results  Component Value Date   INSULIN 23.9 11/03/2022   INSULIN 36.0 (H) 01/11/2021   INSULIN 24.2 04/30/2019   Lab Results  Component Value Date   TSH 2.36 05/06/2022   Lab Results  Component Value Date   CHOL 106 11/03/2022   HDL 57 11/03/2022   LDLCALC 29 11/03/2022   TRIG 110 11/03/2022   CHOLHDL 2.1 04/13/2022   Lab Results  Component Value Date   VD25OH 42.9 11/03/2022   VD25OH 37.52 05/06/2022   VD25OH 84.2 01/11/2021   Lab Results  Component Value Date   WBC 8.7 09/13/2019  HGB 14.2 09/13/2019   HCT 42.7 09/13/2019   MCV 90.2 09/13/2019   PLT 276.0 09/13/2019   No results found for: "IRON", "TIBC", "FERRITIN"  Attestation Statements:   Reviewed by clinician on day of visit: allergies, medications, problem list, medical history, surgical history, family history, social history, and previous encounter notes.   I, Burt Knack, am acting as transcriptionist for Quillian Quince, MD.  I have reviewed the above documentation for accuracy and completeness, and I agree with the above. -  Quillian Quince, MD

## 2023-01-17 ENCOUNTER — Other Ambulatory Visit (HOSPITAL_COMMUNITY): Payer: Self-pay

## 2023-01-17 ENCOUNTER — Other Ambulatory Visit: Payer: Self-pay | Admitting: Cardiology

## 2023-01-17 DIAGNOSIS — I152 Hypertension secondary to endocrine disorders: Secondary | ICD-10-CM

## 2023-01-17 MED ORDER — METOPROLOL SUCCINATE ER 100 MG PO TB24
150.0000 mg | ORAL_TABLET | Freq: Every day | ORAL | 1 refills | Status: DC
  Filled 2023-01-17: qty 45, 30d supply, fill #0
  Filled 2023-02-14: qty 45, 30d supply, fill #1

## 2023-01-18 ENCOUNTER — Encounter (INDEPENDENT_AMBULATORY_CARE_PROVIDER_SITE_OTHER): Payer: Self-pay | Admitting: Family Medicine

## 2023-01-18 ENCOUNTER — Other Ambulatory Visit (HOSPITAL_COMMUNITY): Payer: Self-pay

## 2023-01-18 ENCOUNTER — Ambulatory Visit (INDEPENDENT_AMBULATORY_CARE_PROVIDER_SITE_OTHER): Payer: 59 | Admitting: Family Medicine

## 2023-01-18 VITALS — BP 101/65 | HR 86 | Temp 98.3°F | Ht 70.0 in | Wt 205.0 lb

## 2023-01-18 DIAGNOSIS — Z6829 Body mass index (BMI) 29.0-29.9, adult: Secondary | ICD-10-CM

## 2023-01-18 DIAGNOSIS — I158 Other secondary hypertension: Secondary | ICD-10-CM

## 2023-01-18 DIAGNOSIS — E1165 Type 2 diabetes mellitus with hyperglycemia: Secondary | ICD-10-CM

## 2023-01-18 DIAGNOSIS — Z7985 Long-term (current) use of injectable non-insulin antidiabetic drugs: Secondary | ICD-10-CM | POA: Diagnosis not present

## 2023-01-18 DIAGNOSIS — E669 Obesity, unspecified: Secondary | ICD-10-CM

## 2023-01-18 MED ORDER — TIRZEPATIDE 10 MG/0.5ML ~~LOC~~ SOAJ
10.0000 mg | SUBCUTANEOUS | 0 refills | Status: DC
  Filled 2023-01-18 – 2023-01-23 (×3): qty 2, 28d supply, fill #0

## 2023-01-18 NOTE — Progress Notes (Unsigned)
Chief Complaint:   OBESITY Travis Owens is here to discuss his progress with his obesity treatment plan along with follow-up of his obesity related diagnoses. Travis Owens is on the Category 4 Plan and states he is following his eating plan approximately 30% of the time. Travis Owens states he is doing yard work 2 times per week.   Today's visit was #: 51 Starting weight: 220 lbs Starting date: 04/30/2019 Today's weight: 205 lbs Today's date: 01/18/2023 Total lbs lost to date: 15 Total lbs lost since last in-office visit: 8  Interim History: Patient is doing well with his diet and weight loss. His hunger is controlled and he is working on increasing his protein and not missing meals.   Subjective:   1. Type 2 diabetes mellitus with hyperglycemia, without long-term current use of insulin ([name]) Patient notes decreased polyphagia on Mounjaro. He has no signs of hypoglycemia. He is doing well with his diet and weight loss.   2. Other secondary hypertension Patient's blood pressure very well controlled. He has no signs of hypoglycemia.   Assessment/Plan:   1. Type 2 diabetes mellitus with hyperglycemia, without long-term current use of insulin ([name]) We will refill Mounjaro 10 mg once weekly for 1 month. He will continue metformin and Jardiance, and will continue to increase his water intake.   - tirzepatide (MOUNJARO) 10 MG/0.5ML Pen; Inject 10 mg into the skin once a week.  Dispense: 2 mL; Refill: 0  2. Other secondary hypertension Patient will continue with his diet, exercise, and medications and will watch for signs of needing to decrease his blood pressure medications. He will continue to increase his water intake.   3. BMI 29.0-29.9,adult  4. Obesity, Beginning BMI 33.45 Travis Owens is currently in the action stage of change. As such, his goal is to continue with weight loss efforts. He has agreed to the Category 4 Plan.   Exercise goals: As is.   Behavioral modification strategies: increasing lean  protein intake and meal planning and cooking strategies.  Travis Owens has agreed to follow-up with our clinic in 4 weeks. He was informed of the importance of frequent follow-up visits to maximize his success with intensive lifestyle modifications for his multiple health conditions.   Objective:   Blood pressure 101/65, pulse 86, temperature 98.3 F (36.8 C), height 5\' 10"  (1.778 m), weight 205 lb (93 kg), SpO2 97%. Body mass index is 29.41 kg/m.  Lab Results  Component Value Date   CREATININE 0.76 11/03/2022   BUN 15 11/03/2022   NA 141 11/03/2022   K 5.5 (H) 11/03/2022   CL 103 11/03/2022   CO2 24 11/03/2022   Lab Results  Component Value Date   ALT 27 11/03/2022   AST 19 11/03/2022   ALKPHOS 61 11/03/2022   BILITOT 0.4 11/03/2022   Lab Results  Component Value Date   HGBA1C 8.4 (H) 11/03/2022   HGBA1C 7.2 (A) 05/06/2022   HGBA1C 6.7 (A) 11/04/2021   HGBA1C 6.1 (A) 04/22/2021   HGBA1C 7.2 (A) 10/21/2020   Lab Results  Component Value Date   INSULIN 23.9 11/03/2022   INSULIN 36.0 (H) 01/11/2021   INSULIN 24.2 04/30/2019   Lab Results  Component Value Date   TSH 2.36 05/06/2022   Lab Results  Component Value Date   CHOL 106 11/03/2022   HDL 57 11/03/2022   LDLCALC 29 11/03/2022   TRIG 110 11/03/2022   CHOLHDL 2.1 04/13/2022   Lab Results  Component Value Date   VD25OH 42.9 11/03/2022  VD25OH 37.52 05/06/2022   VD25OH 84.2 01/11/2021   Lab Results  Component Value Date   WBC 8.7 09/13/2019   HGB 14.2 09/13/2019   HCT 42.7 09/13/2019   MCV 90.2 09/13/2019   PLT 276.0 09/13/2019   No results found for: "IRON", "TIBC", "FERRITIN"  Attestation Statements:   Reviewed by clinician on day of visit: allergies, medications, problem list, medical history, surgical history, family history, social history, and previous encounter notes.   I, Burt Knack, am acting as transcriptionist for Quillian Quince, MD.  I have reviewed the above documentation for accuracy  and completeness, and I agree with the above. -  Quillian Quince, MD

## 2023-01-23 ENCOUNTER — Other Ambulatory Visit (HOSPITAL_COMMUNITY): Payer: Self-pay

## 2023-01-23 ENCOUNTER — Other Ambulatory Visit: Payer: Self-pay

## 2023-01-24 ENCOUNTER — Other Ambulatory Visit (HOSPITAL_COMMUNITY): Payer: Self-pay

## 2023-01-26 ENCOUNTER — Ambulatory Visit (INDEPENDENT_AMBULATORY_CARE_PROVIDER_SITE_OTHER): Payer: Medicare Other | Admitting: Family Medicine

## 2023-02-01 ENCOUNTER — Encounter: Payer: Self-pay | Admitting: Cardiology

## 2023-02-01 ENCOUNTER — Ambulatory Visit: Payer: 59 | Attending: Cardiology | Admitting: Cardiology

## 2023-02-01 VITALS — BP 112/62 | HR 80 | Ht 70.0 in | Wt 211.0 lb

## 2023-02-01 DIAGNOSIS — G4733 Obstructive sleep apnea (adult) (pediatric): Secondary | ICD-10-CM

## 2023-02-01 DIAGNOSIS — I10 Essential (primary) hypertension: Secondary | ICD-10-CM

## 2023-02-01 DIAGNOSIS — E785 Hyperlipidemia, unspecified: Secondary | ICD-10-CM | POA: Diagnosis not present

## 2023-02-01 DIAGNOSIS — I447 Left bundle-branch block, unspecified: Secondary | ICD-10-CM | POA: Diagnosis not present

## 2023-02-01 DIAGNOSIS — I5042 Chronic combined systolic (congestive) and diastolic (congestive) heart failure: Secondary | ICD-10-CM

## 2023-02-01 NOTE — Patient Instructions (Signed)
Medication Instructions:  STOP zetia CONTINUE other current meds  *If you need a refill on your cardiac medications before your next appointment, please call your pharmacy*   Lab Work: BMET and Magnesium today   If you have labs (blood work) drawn today and your tests are completely normal, you will receive your results only by: MyChart Message (if you have MyChart) OR A paper copy in the mail If you have any lab test that is abnormal or we need to change your treatment, we will call you to review the results.   Testing/Procedures: Your physician has requested that you have an echocardiogram. Echocardiography is a painless test that uses sound waves to create images of your heart. It provides your doctor with information about the size and shape of your heart and how well your heart's chambers and valves are working. This procedure takes approximately one hour. There are no restrictions for this procedure. Please do NOT wear cologne, perfume, aftershave, or lotions (deodorant is allowed). Please arrive 15 minutes prior to your appointment time.    Follow-Up: At [facility], you and your health needs are our priority.  As part of our continuing mission to provide you with exceptional heart care, we have created designated Provider Care Teams.  These Care Teams include your primary Cardiologist (physician) and Advanced Practice Providers (APPs -  Physician Assistants and Nurse Practitioners) who all work together to provide you with the care you need, when you need it.  We recommend signing up for the patient portal called "MyChart".  Sign up information is provided on this After Visit Summary.  MyChart is used to connect with patients for Virtual Visits (Telemedicine).  Patients are able to view lab/test results, encounter notes, upcoming appointments, etc.  Non-urgent messages can be sent to your provider as well.   To learn more about what you can do with MyChart, go to  [email or web address].    Your next appointment:    6 months with Dr. Bjorn Pippin   First available sleep clinic appointment with Dr. Tresa Endo

## 2023-02-01 NOTE — Progress Notes (Signed)
Cardiology Office Note:    Date:  02/05/2023   ID:  Travis Owens, DOB [birth year], MRN [id number]  PCP:  Ardith Dark, MD  Cardiologist:  Little Ishikawa, MD  Electrophysiologist:  None   Referring MD: Ardith Dark, MD   Chief Complaint  Patient presents with   Congestive Heart Failure     History of Present Illness:    Travis Owens is a 67 y.o. male with a hx of chronic combined systolic and diastolic heart failure, type 2 diabetes, hypertension, hyperlipidemia, OSA who present for follow-up.  He was is referred by Dr. Dalbert Garnet for evaluation of left bundle branch block on 05/01/18.  He was referred to Dr. Dalbert Garnet for weight loss evaluation, an EKG was done which showed left bundle branch block.  TTE was done on 05/03/2019 and showed EF 40 to 45%, with marked marked septal-lateral dyssynchrony consistent with LBBB.  Coronary CTA on 06/06/2019 showed nonobstructive CAD (calcified plaque in the RPDA causing mild (25-49%) stenosis and calcified plaque in the proximal LAD and proximal RCA causing minimal (0-24%) stenosis).  Calcium score 111 (62nd percentile for age/gender).  CMR on 07/25/2019 showed EF 45%, no LGE, RV EF 37%.  Echocardiogram 10/01/2021 showed EF 45 to 50%, grade 1 diastolic dysfunction, dyssynchrony due to LBBB, normal RV function, no significant valvular disease.  Since last clinic visit, he reports he is doing well.  Denies any chest pain, dyspnea, lower extremity edema, or palpitations.  Does report some lightheadedness if stands too fast, denies any syncope.  He has not been exercising but stays active and does yard work.  Reports has not been using his BiPAP.  Wt Readings from Last 3 Encounters:  02/01/23 211 lb (95.7 kg)  01/18/23 205 lb (93 kg)  12/29/22 213 lb (96.6 kg)    BP Readings from Last 3 Encounters:  02/01/23 112/62  01/18/23 101/65  12/29/22 113/65   Wt Readings from Last 3 Encounters:  02/01/23 211 lb (95.7 kg)  01/18/23 205 lb (93 kg)   12/29/22 213 lb (96.6 kg)    Past Medical History:  Diagnosis Date   Anxiety    Chest pain    Depression    Diabetes mellitus without complication ([name])    Fatty liver    GERD (gastroesophageal reflux disease)    Hyperlipidemia    Hypertension    Sleep apnea    has CPAP/does not use    Past Surgical History:  Procedure Laterality Date   COLONOSCOPY     Dr. Juanda Chance  2008    Current Medications: Current Meds  Medication Sig   acetaminophen (TYLENOL) 325 MG tablet Take 1 tablet by mouth as needed.   atorvastatin (LIPITOR) 80 MG tablet Take 1 tablet (80 mg total) by mouth daily.   Blood Glucose Monitoring Suppl (FREESTYLE LITE) DEVI Check Blood sugar twice daily   Cholecalciferol (VITAMIN D) 50 MCG (2000 UT) CAPS Take 1 capsule (2,000 Units total) by mouth daily.   Cyanocobalamin (B-12) 1000 MCG CAPS Take 1,000 Int'l Units by mouth daily in the afternoon.   empagliflozin (JARDIANCE) 10 MG TABS tablet Take 1 tablet (10 mg total) by mouth daily before breakfast.   escitalopram (LEXAPRO) 20 MG tablet Take 1 tablet (20 mg total) by mouth daily.   Evolocumab (REPATHA SURECLICK) 140 MG/ML SOAJ Inject 1 mL into the skin every 14 (fourteen) days.   glucose blood (FREESTYLE LITE) test strip Check Blood sugar twice daily   ibuprofen (ADVIL) 200 MG  tablet Take 1 tablet by mouth as needed.   Lancets (FREESTYLE) lancets Check blood sugar twice daily   losartan (COZAAR) 25 MG tablet Take 1 tablet (25 mg total) by mouth daily.   metFORMIN (GLUCOPHAGE) 1000 MG tablet Take 1 tablet (1,000 mg total) by mouth 2 (two) times daily.   metoprolol succinate (TOPROL-XL) 100 MG 24 hr tablet Take 1&1/2 tablets (150 mg total) by mouth daily.   Multiple Vitamin (MULTIVITAMIN) tablet Take 1 tablet by mouth daily.   pantoprazole (PROTONIX) 40 MG tablet Take 1 tablet (40 mg total) by mouth daily.   spironolactone (ALDACTONE) 25 MG tablet Take 1/2 tablet (12.5 mg total) by mouth daily.   tirzepatide  (MOUNJARO) 10 MG/0.5ML Pen Inject 10 mg into the skin once a week.   [DISCONTINUED] ezetimibe (ZETIA) 10 MG tablet Take 1 tablet (10 mg total) by mouth daily.     Allergies:   Patient has no known allergies.   Social History   Socioeconomic History   Marital status: Married    Spouse name: Manas Hickling   Number of children: Not on file   Years of education: Not on file   Highest education level: Not on file  Occupational History   Occupation: Education officer, environmental    Comment: [employer]  Tobacco Use   Smoking status: Never   Smokeless tobacco: Never  Vaping Use   Vaping status: Never Used  Substance and Sexual Activity   Alcohol use: No   Drug use: No   Sexual activity: Yes  Other Topics Concern   Not on file  Social History Narrative   Not on file   Social Determinants of Health   Financial Resource Strain: Not on file  Food Insecurity: Not on file  Transportation Needs: Not on file  Physical Activity: Not on file  Stress: Not on file  Social Connections: Not on file     Family History: The patient's family history includes Cancer in his father; Hypertension in his paternal grandmother; Stroke in his father and paternal grandfather. There is no history of Colon cancer, Colon polyps, Esophageal cancer, Rectal cancer, or Stomach cancer.  ROS:   Please see the history of present illness.    All other systems reviewed and are negative.  EKGs/Labs/Other Studies Reviewed:    The following studies were reviewed today:  EKG:   02/01/2023: Normal sinus rhythm, left bundle branch block, rate 80 04/13/2022: Normal sinus rhythm, rate 79, left bundle branch block 10/12/2021: Normal sinus rhythm, left bundle branch block, rate 76 04/16/21: LBBB, NSR, rate 85 07/14/2020: normal sinus rhythm, rate 86, left bundle branch block  TTE 05/03/19:  1. Left ventricular ejection fraction, by visual estimation, is 40 to 45%. The left ventricle has mild to moderately decreased function.  There is mildly increased left ventricular hypertrophy. There is marked septal-lateral dyssynchrony consistent with LBBB.  2. Left ventricular diastolic parameters are consistent with Grade I diastolic dysfunction (impaired relaxation).  3. Global right ventricle has normal systolic function.The right ventricular size is normal. No increase in right ventricular wall thickness.  4. Left atrial size was normal.  5. Right atrial size was normal.  6. The mitral valve is normal in structure. No evidence of mitral valve regurgitation. No evidence of mitral stenosis.  7. The tricuspid valve is normal in structure.  8. The aortic valve is tricuspid. Aortic valve regurgitation is not visualized. Mild aortic valve sclerosis without stenosis  9. TR signal is inadequate for assessing pulmonary artery systolic  pressure. 10. The inferior vena cava is normal in size with greater than 50% respiratory variability, suggesting right atrial pressure of 3 mmHg.  Coronary CTA 06/06/19: 1. Coronary calcium score of 111. This was 43 percentile for age and sex matched control.   2. Normal coronary origin with right dominance.   3. Nonobstructive CAD. There is calcified plaque in the RPDA causing mild (25-49%) stenosis and calcified plaque in the proximal LAD and proximal RCA causing minimal (0-24%) stenosis   CAD-RADS 2. Mild non-obstructive CAD (25-49%). Consider non-atherosclerotic causes of chest pain. Consider preventive therapy and risk factor modification.1. Coronary calcium score of 111. This was 3 percentile for age and sex matched control.   2. Normal coronary origin with right dominance.   3. Nonobstructive CAD. There is calcified plaque in the RPDA causing mild (25-49%) stenosis and calcified plaque in the proximal LAD and proximal RCA causing minimal (0-24%) stenosis   CAD-RADS 2. Mild non-obstructive CAD (25-49%). Consider non-atherosclerotic causes of chest pain. Consider preventive therapy  and risk factor modification.  Noncardiac: IMPRESSION: No significant noncardiac findings. Incidental 6 mm densely calcified granuloma in the lingula.  CMR 07/25/19: 1. Normal LV size and thickness with markedly abnormal septal motion EF 45%. 2.  Possible LV non compaction seen best in SA/3 chamber views 3. No delayed gadolinium uptake, scar, infiltration, infarct in LV myocardium 4.  Basal RV dilatation and hypokinesis RVEF 37% 5   normal MV,AV, TV 6.  Normal Aortic root 3.0 cm 7.  Normal T2* 48 msec 8. Unable to calculate T1/ECG failure to acquire adequate T1 map sequence pre contrast      Recent Labs: 05/06/2022: TSH 2.36 11/03/2022: ALT 27 02/01/2023: BUN 24; Creatinine, Ser 0.86; Magnesium 1.7; Potassium 4.3; Sodium 139  Recent Lipid Panel    Component Value Date/Time   CHOL 106 11/03/2022 0946   TRIG 110 11/03/2022 0946   HDL 57 11/03/2022 0946   CHOLHDL 2.1 04/13/2022 0947   CHOLHDL 3 09/13/2019 0941   VLDL 33.2 09/13/2019 0941   LDLCALC 29 11/03/2022 0946    Physical Exam:    VS:  BP 112/62   Pulse 80   Ht 5\' 10"  (1.778 m)   Wt 211 lb (95.7 kg)   BMI 30.28 kg/m     Wt Readings from Last 3 Encounters:  02/01/23 211 lb (95.7 kg)  01/18/23 205 lb (93 kg)  12/29/22 213 lb (96.6 kg)    GEN:   in no acute distress HEENT: Normal NECK: No JVD CARDIAC:RRR, no murmurs, rubs, gallops RESPIRATORY:  Clear to auscultation without rales, wheezing or rhonchi  ABDOMEN: Soft, non-tender, non-distended MUSCULOSKELETAL:  No edema; No deformity  SKIN: Warm and dry NEUROLOGIC:  Alert and oriented x 3 PSYCHIATRIC:  Normal affect   ASSESSMENT:    1. Chronic combined systolic and diastolic heart failure ([name])   2. LBBB (left bundle branch block)   3. Essential hypertension   4. Hyperlipidemia, unspecified hyperlipidemia type   5. OSA on BiPAP       PLAN:    Chronic combined systolic and diastolic heart failure: EF 40 to 45% on TTE from 05/02/18, with marked  septal-lateral dyssynchrony consistent with LBBB.  Coronary CTA showed nonobstructive CAD.  CMR 07/25/19 showed EF 45%, no LGE.  Echocardiogram 10/01/2021 showed EF 45 to 50%, grade 1 diastolic dysfunction, dyssynchrony due to LBBB, normal RV function, no significant valvular disease.  Appears euvolemic.  Suspect systolic dysfunction due to LBBB -Continue losartan 25 mg daily.  Previously was on Entresto but discontinued due to low BP. -Continue Toprol-XL 150 mg daily -Continue Jardiance 10 mg daily -Continue spironolactone 12.5 mg daily -Check BMET, magnesium  Coronary artery disease: coronary CTA on 06/06/2019 showed nonobstructive CAD (calcified plaque in the RPDA causing mild (25-49%) stenosis and calcified plaque in the proximal LAD and proximal RCA causing minimal (0-24%) stenosis.  Calcium score 111 (62nd percentile for age/gender). -Continue atorvastatin and Repatha  Hypertension: Continue Toprol-XL, losartan, spironolactone as above.  Appears controlled  Hyperlipidemia: LDL 85 on 04/16/2021 on atorvastatin 80 mg daily and Zetia 10 mg daily.  Referred to pharmacy lipid clinic and started on Repatha.  LDL 29 on 11/03/2022, can discontinue Repatha  Type 2 diabetes: [name] 6.1% on 04/22/21.  On metformin, semaglutide, Jardiance.  [name] up to 8.4% on 11/03/2022.  OSA: Sleep study on 05/15/2019 confirmed OSA.  Reports he stopped using his BiPAP as having difficulty tolerating.  Recommend follow-up with Dr. Tresa Endo to discuss options  Obesity: follows at [facility].  Lost 30 lbs but has gained about 10 lbs back.  RTC in 6 months  Medication Adjustments/Labs and Tests Ordered: Current medicines are reviewed at length with the patient today.  Concerns regarding medicines are outlined above.  Orders Placed This Encounter  Procedures   Basic metabolic panel   Magnesium   EKG 12-Lead   ECHOCARDIOGRAM COMPLETE    No orders of the defined types were placed in this  encounter.    Patient Instructions  Medication Instructions:  STOP zetia CONTINUE other current meds  *If you need a refill on your cardiac medications before your next appointment, please call your pharmacy*   Lab Work: BMET and Magnesium today   If you have labs (blood work) drawn today and your tests are completely normal, you will receive your results only by: MyChart Message (if you have MyChart) OR A paper copy in the mail If you have any lab test that is abnormal or we need to change your treatment, we will call you to review the results.   Testing/Procedures: Your physician has requested that you have an echocardiogram. Echocardiography is a painless test that uses sound waves to create images of your heart. It provides your doctor with information about the size and shape of your heart and how well your heart's chambers and valves are working. This procedure takes approximately one hour. There are no restrictions for this procedure. Please do NOT wear cologne, perfume, aftershave, or lotions (deodorant is allowed). Please arrive 15 minutes prior to your appointment time.    Follow-Up: At [facility], you and your health needs are our priority.  As part of our continuing mission to provide you with exceptional heart care, we have created designated Provider Care Teams.  These Care Teams include your primary Cardiologist (physician) and Advanced Practice Providers (APPs -  Physician Assistants and Nurse Practitioners) who all work together to provide you with the care you need, when you need it.  We recommend signing up for the patient portal called "MyChart".  Sign up information is provided on this After Visit Summary.  MyChart is used to connect with patients for Virtual Visits (Telemedicine).  Patients are able to view lab/test results, encounter notes, upcoming appointments, etc.  Non-urgent messages can be sent to your provider as well.   To learn more about  what you can do with MyChart, go to [email or web address].    Your next appointment:    6 months with Dr. Bjorn Pippin  First available sleep clinic appointment with Dr. Tresa Endo      Signed, Little Ishikawa, MD  02/05/2023 10:09 PM    [facility]

## 2023-02-02 LAB — BASIC METABOLIC PANEL
BUN/Creatinine Ratio: 28 — ABNORMAL HIGH (ref 10–24)
BUN: 24 mg/dL (ref 8–27)
CO2: 24 mmol/L (ref 20–29)
Calcium: 9.5 mg/dL (ref 8.6–10.2)
Chloride: 103 mmol/L (ref 96–106)
Creatinine, Ser: 0.86 mg/dL (ref 0.76–1.27)
Glucose: 133 mg/dL — ABNORMAL HIGH (ref 70–99)
Potassium: 4.3 mmol/L (ref 3.5–5.2)
Sodium: 139 mmol/L (ref 134–144)
eGFR: 95 mL/min/{1.73_m2} (ref >59)

## 2023-02-02 LAB — MAGNESIUM: Magnesium: 1.7 mg/dL (ref 1.6–2.3)

## 2023-02-23 ENCOUNTER — Encounter (INDEPENDENT_AMBULATORY_CARE_PROVIDER_SITE_OTHER): Payer: Self-pay | Admitting: Family Medicine

## 2023-02-23 ENCOUNTER — Ambulatory Visit (INDEPENDENT_AMBULATORY_CARE_PROVIDER_SITE_OTHER): Payer: 59 | Admitting: Family Medicine

## 2023-02-23 ENCOUNTER — Other Ambulatory Visit (HOSPITAL_COMMUNITY): Payer: Self-pay

## 2023-02-23 VITALS — BP 98/62 | HR 79 | Temp 97.9°F | Ht 70.0 in | Wt 207.0 lb

## 2023-02-23 DIAGNOSIS — E1165 Type 2 diabetes mellitus with hyperglycemia: Secondary | ICD-10-CM | POA: Diagnosis not present

## 2023-02-23 DIAGNOSIS — E860 Dehydration: Secondary | ICD-10-CM | POA: Diagnosis not present

## 2023-02-23 DIAGNOSIS — Z7985 Long-term (current) use of injectable non-insulin antidiabetic drugs: Secondary | ICD-10-CM | POA: Diagnosis not present

## 2023-02-23 DIAGNOSIS — Z6829 Body mass index (BMI) 29.0-29.9, adult: Secondary | ICD-10-CM | POA: Diagnosis not present

## 2023-02-23 DIAGNOSIS — E669 Obesity, unspecified: Secondary | ICD-10-CM

## 2023-02-23 DIAGNOSIS — I10 Essential (primary) hypertension: Secondary | ICD-10-CM | POA: Diagnosis not present

## 2023-02-23 MED ORDER — TIRZEPATIDE 10 MG/0.5ML ~~LOC~~ SOAJ
10.0000 mg | SUBCUTANEOUS | 0 refills | Status: DC
Start: 2023-02-23 — End: 2023-03-16
  Filled 2023-02-23: qty 2, 28d supply, fill #0

## 2023-02-23 NOTE — Progress Notes (Signed)
.smr  Office: [phone]  /  Fax: [phone]  WEIGHT SUMMARY AND BIOMETRICS  Anthropometric Measurements Height: 5\' 10"  (1.778 m) Weight: 207 lb (93.9 kg) BMI (Calculated): 29.7 Weight at Last Visit: 205 lb Weight Lost Since Last Visit: 0 Weight Gained Since Last Visit: 2 lb Starting Weight: 220 lb Total Weight Loss (lbs): 13 lb (5.897 kg)   Body Composition  Body Fat %: 30.9 % Fat Mass (lbs): 64 lbs Muscle Mass (lbs): 136.2 lbs Total Body Water (lbs): 104.2 lbs Visceral Fat Rating : 17   Other Clinical Data Fasting: no Labs: no Today's Visit #: 52 Starting Date: 04/30/19    Chief Complaint: OBESITY   History of Present Illness   The patient, diagnosed with type 2 diabetes and obesity, reports a weight gain of two pounds over the past month. He admits to adhering to his category four eating plan only about half of the time. Despite this, he has been maintaining an exercise routine of 60 minutes twice a week. His diabetes is managed with Mounjaro, metformin, and Jardiance. However, his most recent [name], taken over the summer, was elevated at 8.4.  The patient acknowledges that meal planning and resisting food temptations, particularly during social gatherings, have been challenging. He also mentions that his spouse occasionally brings home leftover food, which he finds hard to resist. Despite these dietary challenges, the patient reports that his hunger is well-controlled, attributing this to the [facility].  The patient also has hypertension, but his blood pressure readings have been consistently good.  The patient's last BUN was elevated, suggesting dehydration, which may be due in part to his medications.          PHYSICAL EXAM:  Blood pressure 98/62, pulse 79, temperature 97.9 F (36.6 C), height 5\' 10"  (1.778 m), weight 207 lb (93.9 kg), SpO2 95%. Body mass index is 29.7 kg/m.  DIAGNOSTIC DATA REVIEWED:  BMET    Component Value Date/Time   NA 139  02/01/2023 0911   K 4.3 02/01/2023 0911   CL 103 02/01/2023 0911   CO2 24 02/01/2023 0911   GLUCOSE 133 (H) 02/01/2023 0911   GLUCOSE 141 (H) 09/13/2019 0941   BUN 24 02/01/2023 0911   CREATININE 0.86 02/01/2023 0911   CALCIUM 9.5 02/01/2023 0911   GFRNONAA 106 04/30/2019 1145   GFRAA 122 04/30/2019 1145   Lab Results  Component Value Date   HGBA1C 8.4 (H) 11/03/2022   HGBA1C 9.4 12/05/2016   Lab Results  Component Value Date   INSULIN 23.9 11/03/2022   INSULIN 24.2 04/30/2019   Lab Results  Component Value Date   TSH 2.36 05/06/2022   CBC    Component Value Date/Time   WBC 8.7 09/13/2019 0941   RBC 4.74 09/13/2019 0941   HGB 14.2 09/13/2019 0941   HGB 14.1 04/30/2019 1145   HCT 42.7 09/13/2019 0941   HCT 42.1 04/30/2019 1145   PLT 276.0 09/13/2019 0941   PLT 273 04/30/2019 1145   MCV 90.2 09/13/2019 0941   MCV 87 04/30/2019 1145   MCH 29.2 04/30/2019 1145   MCHC 33.3 09/13/2019 0941   RDW 15.0 09/13/2019 0941   RDW 13.8 04/30/2019 1145   Iron Studies No results found for: "IRON", "TIBC", "FERRITIN", "IRONPCTSAT" Lipid Panel     Component Value Date/Time   CHOL 106 11/03/2022 0946   TRIG 110 11/03/2022 0946   HDL 57 11/03/2022 0946   CHOLHDL 2.1 04/13/2022 0947   CHOLHDL 3 09/13/2019 0941   VLDL 33.2 09/13/2019  0941   LDLCALC 29 11/03/2022 0946   Hepatic Function Panel     Component Value Date/Time   PROT 6.8 11/03/2022 0946   ALBUMIN 4.6 11/03/2022 0946   AST 19 11/03/2022 0946   ALT 27 11/03/2022 0946   ALKPHOS 61 11/03/2022 0946   BILITOT 0.4 11/03/2022 0946      Component Value Date/Time   TSH 2.36 05/06/2022 0841   Nutritional Lab Results  Component Value Date   VD25OH 42.9 11/03/2022   VD25OH 37.52 05/06/2022   VD25OH 84.2 01/11/2021     Assessment and Plan    Type 2 Diabetes Elevated [name] at 8.4 this summer. Currently on Mounjaro, Metformin, and Jardiance. Patient reports good appetite control with Mounjaro. -Continue current  medications. -Refill Mounjaro 10mg . -Check [name] at next visit.  Obesity Weight gain of 2 pounds over the last month. Adherence to category four eating plan is approximately 50%. Patient is exercising 60 minutes twice weekly. -Encourage increased adherence to category four eating plan. -Consider use of step counter to monitor physical activity.  Hypertension Blood pressure is well controlled, possibly too low. Currently on Losartan 25mg . -Reduce Losartan to 12.5mg  daily. -Advise patient to monitor blood pressure at home 2-3 times per week. -If blood pressure increases significantly, patient to resume previous dose.  Dehydration Last BUN was elevated, possibly due to medications. -Advise patient to increase hydration. -Recheck labs in the next month.  Follow-up appointments -Next appointment on 03/16/2023 to discuss Thanksgiving eating strategies. -Schedule December appointment.        He was informed of the importance of frequent follow up visits to maximize his success with intensive lifestyle modifications for his multiple health conditions.    Quillian Quince, MD

## 2023-02-27 ENCOUNTER — Other Ambulatory Visit (HOSPITAL_COMMUNITY): Payer: Self-pay

## 2023-02-27 ENCOUNTER — Ambulatory Visit (HOSPITAL_COMMUNITY): Payer: 59 | Attending: Cardiology

## 2023-02-27 DIAGNOSIS — I447 Left bundle-branch block, unspecified: Secondary | ICD-10-CM

## 2023-02-27 DIAGNOSIS — I5042 Chronic combined systolic (congestive) and diastolic (congestive) heart failure: Secondary | ICD-10-CM | POA: Diagnosis not present

## 2023-02-27 LAB — ECHOCARDIOGRAM COMPLETE
Area-P 1/2: 4.15 cm2
S' Lateral: 2.9 cm

## 2023-02-27 MED ORDER — PERFLUTREN LIPID MICROSPHERE
1.0000 mL | INTRAVENOUS | Status: AC | PRN
Start: 2023-02-27 — End: 2023-02-27
  Administered 2023-02-27: 2 mL via INTRAVENOUS

## 2023-03-07 ENCOUNTER — Other Ambulatory Visit: Payer: Self-pay

## 2023-03-07 DIAGNOSIS — I2510 Atherosclerotic heart disease of native coronary artery without angina pectoris: Secondary | ICD-10-CM

## 2023-03-07 DIAGNOSIS — I5042 Chronic combined systolic (congestive) and diastolic (congestive) heart failure: Secondary | ICD-10-CM

## 2023-03-14 DIAGNOSIS — I2510 Atherosclerotic heart disease of native coronary artery without angina pectoris: Secondary | ICD-10-CM | POA: Diagnosis not present

## 2023-03-14 DIAGNOSIS — I5042 Chronic combined systolic (congestive) and diastolic (congestive) heart failure: Secondary | ICD-10-CM | POA: Diagnosis not present

## 2023-03-15 ENCOUNTER — Other Ambulatory Visit: Payer: Self-pay

## 2023-03-15 ENCOUNTER — Other Ambulatory Visit: Payer: Self-pay | Admitting: Cardiology

## 2023-03-15 DIAGNOSIS — I152 Hypertension secondary to endocrine disorders: Secondary | ICD-10-CM

## 2023-03-15 LAB — CBC WITH DIFFERENTIAL/PLATELET
Basophils Absolute: 0 10*3/uL (ref 0.0–0.2)
Basos: 1 %
EOS (ABSOLUTE): 0.1 10*3/uL (ref 0.0–0.4)
Eos: 2 %
Hematocrit: 40.2 % (ref 37.5–51.0)
Hemoglobin: 13.3 g/dL (ref 13.0–17.7)
Immature Grans (Abs): 0 10*3/uL (ref 0.0–0.1)
Immature Granulocytes: 0 %
Lymphocytes Absolute: 1.8 10*3/uL (ref 0.7–3.1)
Lymphs: 26 %
MCH: 30.8 pg (ref 26.6–33.0)
MCHC: 33.1 g/dL (ref 31.5–35.7)
MCV: 93 fL (ref 79–97)
Monocytes Absolute: 0.5 10*3/uL (ref 0.1–0.9)
Monocytes: 7 %
Neutrophils Absolute: 4.5 10*3/uL (ref 1.4–7.0)
Neutrophils: 64 %
Platelets: 268 10*3/uL (ref 150–450)
RBC: 4.32 x10E6/uL (ref 4.14–5.80)
RDW: 13.4 % (ref 11.6–15.4)
WBC: 7 10*3/uL (ref 3.4–10.8)

## 2023-03-15 LAB — BASIC METABOLIC PANEL
BUN/Creatinine Ratio: 18 (ref 10–24)
BUN: 13 mg/dL (ref 8–27)
CO2: 28 mmol/L (ref 20–29)
Calcium: 9.2 mg/dL (ref 8.6–10.2)
Chloride: 102 mmol/L (ref 96–106)
Creatinine, Ser: 0.73 mg/dL — ABNORMAL LOW (ref 0.76–1.27)
Glucose: 200 mg/dL — ABNORMAL HIGH (ref 70–99)
Potassium: 4.4 mmol/L (ref 3.5–5.2)
Sodium: 140 mmol/L (ref 134–144)
eGFR: 100 mL/min/{1.73_m2} (ref >59)

## 2023-03-16 ENCOUNTER — Encounter (INDEPENDENT_AMBULATORY_CARE_PROVIDER_SITE_OTHER): Payer: Self-pay | Admitting: Family Medicine

## 2023-03-16 ENCOUNTER — Other Ambulatory Visit (HOSPITAL_COMMUNITY): Payer: Self-pay

## 2023-03-16 ENCOUNTER — Ambulatory Visit (INDEPENDENT_AMBULATORY_CARE_PROVIDER_SITE_OTHER): Payer: 59 | Admitting: Family Medicine

## 2023-03-16 VITALS — BP 104/64 | HR 75 | Temp 97.9°F | Ht 70.0 in | Wt 207.0 lb

## 2023-03-16 DIAGNOSIS — E1165 Type 2 diabetes mellitus with hyperglycemia: Secondary | ICD-10-CM

## 2023-03-16 DIAGNOSIS — Z6829 Body mass index (BMI) 29.0-29.9, adult: Secondary | ICD-10-CM | POA: Diagnosis not present

## 2023-03-16 DIAGNOSIS — E669 Obesity, unspecified: Secondary | ICD-10-CM | POA: Diagnosis not present

## 2023-03-16 DIAGNOSIS — Z7985 Long-term (current) use of injectable non-insulin antidiabetic drugs: Secondary | ICD-10-CM | POA: Diagnosis not present

## 2023-03-16 DIAGNOSIS — I10 Essential (primary) hypertension: Secondary | ICD-10-CM

## 2023-03-16 DIAGNOSIS — L259 Unspecified contact dermatitis, unspecified cause: Secondary | ICD-10-CM | POA: Diagnosis not present

## 2023-03-16 DIAGNOSIS — L24B0 Irritant contact dermatitis related to unspecified stoma or fistula: Secondary | ICD-10-CM | POA: Insufficient documentation

## 2023-03-16 MED ORDER — TIRZEPATIDE 10 MG/0.5ML ~~LOC~~ SOAJ
10.0000 mg | SUBCUTANEOUS | 0 refills | Status: DC
  Filled 2023-03-16 – 2023-03-20 (×2): qty 2, 28d supply, fill #0

## 2023-03-16 MED ORDER — METOPROLOL SUCCINATE ER 100 MG PO TB24
150.0000 mg | ORAL_TABLET | Freq: Every day | ORAL | 3 refills | Status: DC
  Filled 2023-03-16 – 2023-03-28 (×2): qty 135, 90d supply, fill #0
  Filled 2023-06-08: qty 135, 90d supply, fill #1
  Filled 2023-09-04: qty 135, 90d supply, fill #2
  Filled 2024-01-17: qty 135, 90d supply, fill #0

## 2023-03-16 NOTE — Progress Notes (Signed)
.smr  Office: [phone]  /  Fax: [phone]  WEIGHT SUMMARY AND BIOMETRICS  Anthropometric Measurements Height: 5\' 10"  (1.778 m) Weight: 207 lb (93.9 kg) BMI (Calculated): 29.7 Weight at Last Visit: 207 lb Weight Lost Since Last Visit: 0 Weight Gained Since Last Visit: 0 Starting Weight: 220 lb Total Weight Loss (lbs): 13 lb (5.897 kg)   Body Composition  Body Fat %: 30.9 % Fat Mass (lbs): 64.2 lbs Muscle Mass (lbs): 136.4 lbs Total Body Water (lbs): 102.6 lbs Visceral Fat Rating : 17   Other Clinical Data Fasting: no Labs: no Today's Visit #: 6 Starting Date: 04/30/19    Chief Complaint: OBESITY     History of Present Illness   The patient, with a history of hypertension, type 2 diabetes, and obesity, has been managing his conditions with Mounjaro, Metformin, Jardiance, Losartan, and Metoprolol. He reports no new health issues and his blood pressure is well controlled. His weight has remained stable over the past six weeks, and he adheres to a category four eating plan approximately 40% of the time. He has been physically active, engaging in strenuous activities such as tree cutting for about half an hour on most days.  Recently, the patient developed a rash, which he suspects might be due to contact with poison ivy while working outdoors. The rash appeared suddenly and is localized to a few areas on his body. He reports no itching or other discomfort associated with the rash. He also mentions potential exposure to smoke from burning wood that may have contained poison ivy.  The patient's spouse also developed a similar rash after working in the yard. The patient's spouse has a history of asthma and has been experiencing an asthma flare-up, but is currently being treated and has returned to work.  The patient plans to continue his physical activities and maintain his current medication regimen. He has an upcoming appointment scheduled for next month.           PHYSICAL EXAM:  Blood pressure 104/64, pulse 75, temperature 97.9 F (36.6 C), height 5\' 10"  (1.778 m), weight 207 lb (93.9 kg), SpO2 96%. Body mass index is 29.7 kg/m.  DIAGNOSTIC DATA REVIEWED:  BMET    Component Value Date/Time   NA 140 03/14/2023 0954   K 4.4 03/14/2023 0954   CL 102 03/14/2023 0954   CO2 28 03/14/2023 0954   GLUCOSE 200 (H) 03/14/2023 0954   GLUCOSE 141 (H) 09/13/2019 0941   BUN 13 03/14/2023 0954   CREATININE 0.73 (L) 03/14/2023 0954   CALCIUM 9.2 03/14/2023 0954   GFRNONAA 106 04/30/2019 1145   GFRAA 122 04/30/2019 1145   Lab Results  Component Value Date   HGBA1C 8.4 (H) 11/03/2022   HGBA1C 9.4 12/05/2016   Lab Results  Component Value Date   INSULIN 23.9 11/03/2022   INSULIN 24.2 04/30/2019   Lab Results  Component Value Date   TSH 2.36 05/06/2022   CBC    Component Value Date/Time   WBC 7.0 03/14/2023 0954   WBC 8.7 09/13/2019 0941   RBC 4.32 03/14/2023 0954   RBC 4.74 09/13/2019 0941   HGB 13.3 03/14/2023 0954   HCT 40.2 03/14/2023 0954   PLT 268 03/14/2023 0954   MCV 93 03/14/2023 0954   MCH 30.8 03/14/2023 0954   MCHC 33.1 03/14/2023 0954   MCHC 33.3 09/13/2019 0941   RDW 13.4 03/14/2023 0954   Iron Studies No results found for: "IRON", "TIBC", "FERRITIN", "IRONPCTSAT" Lipid Panel  Component Value Date/Time   CHOL 106 11/03/2022 0946   TRIG 110 11/03/2022 0946   HDL 57 11/03/2022 0946   CHOLHDL 2.1 04/13/2022 0947   CHOLHDL 3 09/13/2019 0941   VLDL 33.2 09/13/2019 0941   LDLCALC 29 11/03/2022 0946   Hepatic Function Panel     Component Value Date/Time   PROT 6.8 11/03/2022 0946   ALBUMIN 4.6 11/03/2022 0946   AST 19 11/03/2022 0946   ALT 27 11/03/2022 0946   ALKPHOS 61 11/03/2022 0946   BILITOT 0.4 11/03/2022 0946      Component Value Date/Time   TSH 2.36 05/06/2022 0841   Nutritional Lab Results  Component Value Date   VD25OH 42.9 11/03/2022   VD25OH 37.52 05/06/2022   VD25OH 84.2 01/11/2021      Assessment and Plan    Contact Dermatitis Recent onset of rash, likely due to contact with poison ivy or similar irritant. Rash appeared yesterday and is not itchy. Spouse has a similar rash. No respiratory symptoms. Discussed over-the-counter hydrocortisone or calamine lotion and potential need for prednisone if rash spreads. Explained non-contagious nature and importance of avoiding irritants and proper handling of contaminated clothing. - Apply over-the-counter hydrocortisone or calamine lotion - Monitor for spread of rash - Consider e-visit if rash spreads - Educate on avoiding irritants and proper handling of contaminated clothing  Hypertension Hypertension well-controlled with losartan and metoprolol. Blood pressure today is 104/64 mmHg. Recent losartan dose reduction has been beneficial, with no dizziness during physical activities. Patient reports feeling better with adjusted dose. - Continue losartan and metoprolol - Monitor blood pressure regularly - Encourage hydration  Type 2 Diabetes Mellitus Managed with Mounjaro 10 mg, metformin, and Jardiance. No issues with current medications. Patient maintains weight and is physically active. Discussed portion control and mindful eating during holidays to manage blood glucose levels. - Refill [facility]jaro at [facility] - Continue metformin and Jardiance - Encourage adherence to diet and exercise plan  Obesity Patient follows category four eating plan 40% of the time and has maintained weight over the last six weeks. Engages in physical activity (cutting down trees) for about 30 minutes most days. Discussed portion control and mindful eating during holidays to avoid weight gain. - Encourage adherence to eating plan - Continue physical activity - Discuss portion control and mindful eating during holidays  General Health Maintenance Patient is generally well and has an upcoming appointment scheduled. Ensured all medications are  refilled for the holiday season. - Schedule January appointment - Ensure all medications are refilled for the holiday season  Follow-up - Confirm December appointment - Schedule January appointment.          He was informed of the importance of frequent follow up visits to maximize his success with intensive lifestyle modifications for his multiple health conditions.    Quillian Quince, MD

## 2023-03-20 ENCOUNTER — Other Ambulatory Visit (HOSPITAL_COMMUNITY): Payer: Self-pay

## 2023-03-28 ENCOUNTER — Other Ambulatory Visit (HOSPITAL_COMMUNITY): Payer: Self-pay

## 2023-04-12 ENCOUNTER — Encounter (INDEPENDENT_AMBULATORY_CARE_PROVIDER_SITE_OTHER): Payer: Self-pay | Admitting: Family Medicine

## 2023-04-12 ENCOUNTER — Other Ambulatory Visit (HOSPITAL_COMMUNITY): Payer: Self-pay

## 2023-04-12 ENCOUNTER — Ambulatory Visit (INDEPENDENT_AMBULATORY_CARE_PROVIDER_SITE_OTHER): Payer: 59 | Admitting: Family Medicine

## 2023-04-12 VITALS — BP 102/64 | HR 83 | Temp 97.5°F | Ht 70.0 in | Wt 202.0 lb

## 2023-04-12 DIAGNOSIS — Z6829 Body mass index (BMI) 29.0-29.9, adult: Secondary | ICD-10-CM

## 2023-04-12 DIAGNOSIS — R197 Diarrhea, unspecified: Secondary | ICD-10-CM

## 2023-04-12 DIAGNOSIS — E669 Obesity, unspecified: Secondary | ICD-10-CM | POA: Diagnosis not present

## 2023-04-12 DIAGNOSIS — E785 Hyperlipidemia, unspecified: Secondary | ICD-10-CM | POA: Diagnosis not present

## 2023-04-12 DIAGNOSIS — E1165 Type 2 diabetes mellitus with hyperglycemia: Secondary | ICD-10-CM

## 2023-04-12 DIAGNOSIS — Z6828 Body mass index (BMI) 28.0-28.9, adult: Secondary | ICD-10-CM

## 2023-04-12 DIAGNOSIS — Z7985 Long-term (current) use of injectable non-insulin antidiabetic drugs: Secondary | ICD-10-CM

## 2023-04-12 DIAGNOSIS — E119 Type 2 diabetes mellitus without complications: Secondary | ICD-10-CM

## 2023-04-12 MED ORDER — TIRZEPATIDE 10 MG/0.5ML ~~LOC~~ SOAJ
10.0000 mg | SUBCUTANEOUS | 0 refills | Status: DC
  Filled 2023-04-12: qty 2, 28d supply, fill #0

## 2023-04-12 NOTE — Progress Notes (Signed)
.smr  Office: [phone]  /  Fax: [phone]  WEIGHT SUMMARY AND BIOMETRICS  Anthropometric Measurements Height: 5\' 10"  (1.778 m) Weight: 202 lb (91.6 kg) BMI (Calculated): 28.98 Weight at Last Visit: 207 lb Weight Lost Since Last Visit: 5 lb Weight Gained Since Last Visit: 0 Starting Weight: 220 lb Total Weight Loss (lbs): 18 lb (8.165 kg)   Body Composition  Body Fat %: 30.6 % Fat Mass (lbs): 62 lbs Muscle Mass (lbs): 133.4 lbs Total Body Water (lbs): 99.6 lbs Visceral Fat Rating : 17   Other Clinical Data Fasting: no Labs: no Today's Visit #: 54 Starting Date: 04/30/19    Chief Complaint: OBESITY   History of Present Illness   The patient, diagnosed with type two diabetes, obesity, and hyperlipidemia, has been managing his conditions with a combination of medications and lifestyle modifications. He is currently on Lipitor for hyperlipidemia, and Jardiance, metformin, and Mounjaro for diabetes. He has been actively working on diet and exercise to improve his health.  The patient has shown significant progress, losing five pounds over the last month. He has been following his category four plan about forty percent of the time and has been engaging in yard work for about two to three hours a week.  Despite the progress, the patient has been experiencing some issues. He has been waking up frequently at night due to hunger, which he attributes to the [facility]. He has also been struggling to meet his protein intake goals.  In addition to these issues, the patient has recently experienced episodes of diarrhea. He has not felt particularly ill or run a temperature during these episodes, which have been occurring over the last week or two. The patient has been hydrating and consuming electrolytes to manage this issue.  The patient's weight loss journey has been marked by fluctuations. He once dropped down to around a hundred and ninety pounds, and he is currently about ten  pounds away from that previous low. He is mindful of the need to continue not just exercising, but also working on strengthening to prevent muscle loss and a drop in metabolism.  The patient's medication regimen appears to be generally effective, but he has requested a refill of his Mounjaro. He has not reported feeling lightheaded or dizzy. He has been on his current regimen for quite a while and is familiar with its effects.  In summary, the patient is actively managing his type two diabetes, obesity, and hyperlipidemia with a combination of medications and lifestyle modifications. He has made significant progress but is experiencing some issues, including nocturnal hunger and recent episodes of diarrhea. He is mindful of the need to continue his efforts and is committed to improving his health.          PHYSICAL EXAM:  Blood pressure 102/64, pulse 83, temperature (!) 97.5 F (36.4 C), height 5\' 10"  (1.778 m), weight 202 lb (91.6 kg), SpO2 97%. Body mass index is 28.98 kg/m.  DIAGNOSTIC DATA REVIEWED:  BMET    Component Value Date/Time   NA 140 03/14/2023 0954   K 4.4 03/14/2023 0954   CL 102 03/14/2023 0954   CO2 28 03/14/2023 0954   GLUCOSE 200 (H) 03/14/2023 0954   GLUCOSE 141 (H) 09/13/2019 0941   BUN 13 03/14/2023 0954   CREATININE 0.73 (L) 03/14/2023 0954   CALCIUM 9.2 03/14/2023 0954   GFRNONAA 106 04/30/2019 1145   GFRAA 122 04/30/2019 1145   Lab Results  Component Value Date   HGBA1C 8.4 (H)  11/03/2022   HGBA1C 9.4 12/05/2016   Lab Results  Component Value Date   INSULIN 23.9 11/03/2022   INSULIN 24.2 04/30/2019   Lab Results  Component Value Date   TSH 2.36 05/06/2022   CBC    Component Value Date/Time   WBC 7.0 03/14/2023 0954   WBC 8.7 09/13/2019 0941   RBC 4.32 03/14/2023 0954   RBC 4.74 09/13/2019 0941   HGB 13.3 03/14/2023 0954   HCT 40.2 03/14/2023 0954   PLT 268 03/14/2023 0954   MCV 93 03/14/2023 0954   MCH 30.8 03/14/2023 0954   MCHC  33.1 03/14/2023 0954   MCHC 33.3 09/13/2019 0941   RDW 13.4 03/14/2023 0954   Iron Studies No results found for: "IRON", "TIBC", "FERRITIN", "IRONPCTSAT" Lipid Panel     Component Value Date/Time   CHOL 106 11/03/2022 0946   TRIG 110 11/03/2022 0946   HDL 57 11/03/2022 0946   CHOLHDL 2.1 04/13/2022 0947   CHOLHDL 3 09/13/2019 0941   VLDL 33.2 09/13/2019 0941   LDLCALC 29 11/03/2022 0946   Hepatic Function Panel     Component Value Date/Time   PROT 6.8 11/03/2022 0946   ALBUMIN 4.6 11/03/2022 0946   AST 19 11/03/2022 0946   ALT 27 11/03/2022 0946   ALKPHOS 61 11/03/2022 0946   BILITOT 0.4 11/03/2022 0946      Component Value Date/Time   TSH 2.36 05/06/2022 0841   Nutritional Lab Results  Component Value Date   VD25OH 42.9 11/03/2022   VD25OH 37.52 05/06/2022   VD25OH 84.2 01/11/2021     Assessment and Plan    Type 2 Diabetes Mellitus Type 2 diabetes mellitus, managed with Jardiance, metformin, and Mounjaro. Reports good hunger control and a five-pound weight loss over the last month. Following category four diet plan 40% of the time and engaging in yard work for 2-3 hours weekly. No lightheadedness or dizziness. Diarrhea noted, possibly due to viral illness or high intake of simple carbohydrates. Discussed hydration and electrolyte balance, especially with diarrhea, which may last up to 10 days. - Refill Mounjaro - Continue Jardiance, metformin, and Mounjaro - Monitor blood glucose levels - Maintain hydration and electrolyte balance - Follow up if diarrhea persists beyond 10 days  Acute Diarrhea Acute diarrhea, likely viral. No fever or significant illness. Advised on hydration and electrolyte balance using solutions like sugar-free Gatorade. - Maintain hydration with electrolyte solutions - Monitor symptoms and follow up if diarrhea persists beyond 10 days  Obesity Obesity with recent five-pound weight loss. Following category four diet plan 40% of the time and  engaging in yard work for 2-3 hours weekly. Encouraged to continue aerobic and strengthening exercises to maintain muscle mass and metabolism. - Continue diet and exercise regimen - Increase adherence to diet plan - Incorporate strengthening exercises  Hyperlipidemia Hyperlipidemia, managed with Lipitor. No new symptoms or concerns. - Continue Lipitor - Monitor lipid levels at next visit  Follow-up - Confirm January 16th appointment - Schedule February appointment.          He was informed of the importance of frequent follow up visits to maximize his success with intensive lifestyle modifications for his multiple health conditions.    Quillian Quince, MD

## 2023-04-25 ENCOUNTER — Other Ambulatory Visit (HOSPITAL_COMMUNITY): Payer: Self-pay

## 2023-05-04 ENCOUNTER — Encounter: Payer: Self-pay | Admitting: Cardiovascular Disease

## 2023-05-04 ENCOUNTER — Ambulatory Visit: Payer: 59 | Attending: Cardiovascular Disease | Admitting: Cardiovascular Disease

## 2023-05-04 VITALS — BP 112/78 | HR 71 | Ht 70.0 in | Wt 210.8 lb

## 2023-05-04 DIAGNOSIS — I447 Left bundle-branch block, unspecified: Secondary | ICD-10-CM | POA: Diagnosis not present

## 2023-05-04 DIAGNOSIS — I5042 Chronic combined systolic (congestive) and diastolic (congestive) heart failure: Secondary | ICD-10-CM | POA: Diagnosis not present

## 2023-05-04 DIAGNOSIS — E1165 Type 2 diabetes mellitus with hyperglycemia: Secondary | ICD-10-CM

## 2023-05-04 DIAGNOSIS — I10 Essential (primary) hypertension: Secondary | ICD-10-CM | POA: Diagnosis not present

## 2023-05-04 DIAGNOSIS — E785 Hyperlipidemia, unspecified: Secondary | ICD-10-CM

## 2023-05-04 DIAGNOSIS — G4733 Obstructive sleep apnea (adult) (pediatric): Secondary | ICD-10-CM

## 2023-05-04 NOTE — Progress Notes (Signed)
 Cardiology Office Note    Date:  05/06/2023   ID:  Travis Owens, DOB [birth year], MRN [id number]  PCP:  Kennyth Worth HERO, MD  Cardiologist:  Debby Sor, MD (sleep): Dr. Kate  22 month follow-up sleep evaluation.  History of Present Illness:  Travis Owens is a 68 y.o. male who is followed by Dr. Kate for primary cardiology care.  He presents for a 22 month follow-up sleep evaluation.    Travis Owens has a history of chronic combined systolic and diastolic heart failure, type 2 diabetes mellitus, hypertension, as well as hyperlipidemia.  He has been demonstrated to have left bundle branch block.  Coronary CTA in February 2021 showed nonobstructive CAD with a calcium  score of 111 (62nd percentile for age/gender.  CMR on July 25, 2019 showed an EF 45% with RV EF 37% and no late gadolinium enhancement.  Due to concerns for obstructive sleep apnea, he was referred for a sleep study on May 23, 2019.  He met split-night criteria and on the diagnostic portion of the study sleep apnea was severe with an AHI of 50.5/h, RDI 76.1/h, and he was unable to achieve any REM sleep.  Oxygen desaturated to a nadir of 76%.  He underwent CPAP titration and was transitioned to BiPAP therapy due to central events.  He was titrated up to 18/14 but he did not have any REM sleep at that stage.    Prior to CPAP therapy, he admits to snoring, daytime sleepiness, frequent nocturia, nonrestorative sleep, as well as fatigability.  He typically goes to bed at midnight and may wake up around 6 or 7 AM.  He states remotely he had a sleep study 20 years ago.  I saw him for initial sleep evaluation with me on January 02, 2020.  We were able to obtain several downloads.  It appears that his initial download  from November 12, 2019 through December 11, 2019 he was meeting usage compliance at 90 days but he just barely missed duration compliance with usage greater than 4 hours at only 67%.  His average usage was 4  hours and 45 minutes.  AHI was 12.2 and his 95th percentile pressure was 21.7/17.7.  Download in August showed decreased compliance but he had been out of town and forgot to take his machine with him.  AHI was 8.8 and 95th percentile pressure was 21.6/17.6.  [place] he has used CPAP with 100% compliance since his return in August 29.  However the download from August 10 through January 01, 2020 was inclusive of days where he did not have machine and therefore compliance was not met.  He does admit to feeling improved since initiating CPAP therapy, however, he still has residual daytime sleepiness.  An Epworth Sleepiness Scale score was endorsed in the office today and this endorsed at 13 as shown below:  Epworth Sleepiness Scale: Situation   Chance of Dozing/Sleeping (0 = never , 1 = slight chance , 2 = moderate chance , 3 = high chance )   sitting and reading 2   watching TV 2   sitting inactive in a public place 1   being a passenger in a motor vehicle for an hour or more 2   lying down in the afternoon 3   sitting and talking to someone 0   sitting quietly after lunch (no alcohol) 3   while stopped for a  few minutes in traffic as the driver 0   Total Score  13   During his initial evaluation I discussed the importance of meeting compliance standards.  I made adjustment to his equipment and decreased his ramp time from 30 minutes down to 15 minutes.  I increased his minimum EPAP initiation to 14, and change his initial pressure support of 5 cm water pressure.  At the time he was consistently having 95th percentile pressures at almost 22/18.  I recommended that a download be obtained within 4 weeks.  I saw him on July 08, 2020.  At that time, Mr. Guidroz felt well and was compliant with CPAP.  A download from February 14 through July 07, 2020 shows 80% of usage days with 73% of usage greater than 4 hours.  Average use was 6 hours and 42  minutes.  He is set at minimum EPAP pressure of 15 with maximum IPAP pressure 25 and his 95th percentile pressure now is 23.3/18.2 with maximum average pressure of 24.2/19.2.  AHI is 8.1.  An Epworth Sleepiness Scale score was calculated in the office today and this endorsed at 5 arguing against recurrent daytime sleepiness.  He feels that his sleep is significantly improved with therapy than prior to treatment.  He is unaware of breakthrough snoring.  During that evaluation, his AHI was elevated despite requiring high pressures.  As result I increase his minimum EPAP pressure to 17 so that his initial pressure will be 22/17 and changed his ramp start pressure from 4 cm up to 8 cm with ramp time at 15 minutes.  I last saw him in July 12, 2021.  He was continuing to see Dr. Kate for cardiology care.    He has continued to use CPAP, but had problems with his full facemask which was recently changed.  He also had COVID 3 weeks previously.  As result there were days of noncompliance.  A download from February 16 through July 09, 2020 demonstrated usage at 70% with average use at 4 hours and 12 minutes.  Apparently his pressures are now set at a minimum EPAP of 13 with maximum of 25.  AHI is elevated at 16.1 with apnea index 14.9 and central index 5.4.  He typically goes to bed at midnight and wakes up approximately every 8 AM.  An Epworth Sleepiness Scale score was calculated in the office today and this endorsed at 3.    Since I last saw him, he tells me he has not used CPAP since October 28, 2021.  During this time he has been exclusively sleeping on his stomach since he cannot sleep on his back due to his sleep apnea.  He recently saw Dr. Marlyn on February 01, 2023 and was on losartan , Toprol  XL 150 mg daily, Jardiance  10 mg daily and spironolactone  12.5 mg for his mildly reduced EF at 45 to 50%.  Due to previous hypotension on Entresto  he had been switched back to losartan .  He is diabetic.  During that  evaluation since he was not using his BiPAP therapy, he is referred back for sleep reassessment with me.  Presently, he tells me he goes to bed around midnight and wakes up around 8 or 9.  If he sleeps on his stomach he is able to have some dreams suggestive of RAM sleep attainment.  He presents for follow-up evaluation.  Past Medical History:  Diagnosis Date   Anxiety    Chest pain    Depression  Diabetes mellitus without complication ([name])    Fatty liver    GERD (gastroesophageal reflux disease)    Hyperlipidemia    Hypertension    Sleep apnea    has CPAP/does not use    Past Surgical History:  Procedure Laterality Date   COLONOSCOPY     Dr. Obie  2008    Current Medications: Outpatient Medications Prior to Visit  Medication Sig Dispense Refill   acetaminophen (TYLENOL) 325 MG tablet Take 1 tablet by mouth as needed.     atorvastatin  (LIPITOR) 80 MG tablet Take 1 tablet (80 mg total) by mouth daily. 90 tablet 3   Blood Glucose Monitoring Suppl (FREESTYLE LITE) DEVI Check Blood sugar twice daily 1 Device 0   Cholecalciferol (VITAMIN D ) 50 MCG (2000 UT) CAPS Take 1 capsule (2,000 Units total) by mouth daily. 30 capsule 0   Cyanocobalamin  (B-12) 1000 MCG CAPS Take 1,000 Int'l Units by mouth daily in the afternoon.     empagliflozin  (JARDIANCE ) 10 MG TABS tablet Take 1 tablet (10 mg total) by mouth daily before breakfast. 90 tablet 3   escitalopram  (LEXAPRO ) 20 MG tablet Take 1 tablet (20 mg total) by mouth daily. 90 tablet 3   Evolocumab  (REPATHA  SURECLICK) 140 MG/ML SOAJ Inject 1 mL into the skin every 14 (fourteen) days. 6 mL 3   glucose blood (FREESTYLE LITE) test strip Check Blood sugar twice daily 100 each 12   ibuprofen (ADVIL) 200 MG tablet Take 1 tablet by mouth as needed.     Lancets (FREESTYLE) lancets Check blood sugar twice daily 100 each 12   losartan  (COZAAR ) 25 MG tablet Take 12.5 mg by mouth daily.     metFORMIN  (GLUCOPHAGE ) 1000 MG tablet Take 1 tablet (1,000  mg total) by mouth 2 (two) times daily. 180 tablet 3   metoprolol  succinate (TOPROL -XL) 100 MG 24 hr tablet Take 1&1/2 tablets (150 mg total) by mouth daily. 135 tablet 3   Multiple Vitamin (MULTIVITAMIN) tablet Take 1 tablet by mouth daily.     pantoprazole  (PROTONIX ) 40 MG tablet Take 1 tablet (40 mg total) by mouth daily. 90 tablet 3   spironolactone  (ALDACTONE ) 25 MG tablet Take 1/2 tablet (12.5 mg total) by mouth daily. 45 tablet 1   tirzepatide  (MOUNJARO ) 10 MG/0.5ML Pen Inject 10 mg into the skin once a week. 2 mL 0   No facility-administered medications prior to visit.     Allergies:   Patient has no known allergies.   Social History   Socioeconomic History   Marital status: Married    Spouse name: Ephrem Carrick   Number of children: Not on file   Years of education: Not on file   Highest education level: Not on file  Occupational History   Occupation: Education Officer, Environmental    Comment: [employer]  Tobacco Use   Smoking status: Never   Smokeless tobacco: Never  Vaping Use   Vaping status: Never Used  Substance and Sexual Activity   Alcohol use: No   Drug use: No   Sexual activity: Yes  Other Topics Concern   Not on file  Social History Narrative   Not on file   Social Drivers of Health   Financial Resource Strain: Not on file  Food Insecurity: Not on file  Transportation Needs: Not on file  Physical Activity: Not on file  Stress: Not on file  Social Connections: Not on file    Socially he is married for > 20 years.  He does  not have children.  He works as a education officer, environmental.  Family History:  The patient's family history includes Cancer in his father; Hypertension in his paternal grandmother; Stroke in his father and paternal grandfather.  Father died at age 73 and was a heavy smoker who had lung cancer Mother died at age 15 He has 1 brother age 91 A cousin has sleep apnea.  ROS General: Negative; No fevers, chills, or night sweats;  HEENT: Negative; No changes in  vision or hearing, sinus congestion, difficulty swallowing Pulmonary: Negative; No cough, wheezing, shortness of breath, hemoptysis Cardiovascular: Mild chronic systolic and diastolic heart failure, hypertension, hyperlipidemia GI: Negative; No nausea, vomiting, diarrhea, or abdominal pain GU: Negative; No dysuria, hematuria, or difficulty voiding Musculoskeletal: Negative; no myalgias, joint pain, or weakness Hematologic/Oncology: Negative; no easy bruising, bleeding Endocrine: Type 2 diabetes mellitus Neuro: Negative; no changes in balance, headaches Skin: Negative; No rashes or skin lesions Psychiatric: Negative; No behavioral problems, depression Sleep: Positive for OSA, severe with snoring, previous daytime sleepiness, nonrestorative sleep, frequent awakenings, and excessive daytime sleepiness.  No bruxism, restless legs, hypnogognic hallucinations, no cataplexy Other comprehensive 14 point system review is negative.   PHYSICAL EXAM:   VS:  BP 112/78   Pulse 71   Ht 5' 10 (1.778 m)   Wt 210 lb 12.8 oz (95.6 kg)   SpO2 95%   BMI 30.25 kg/m     Repeat blood pressure by me was 116/76.  Wt Readings from Last 3 Encounters:  05/04/23 210 lb 12.8 oz (95.6 kg)  04/12/23 202 lb (91.6 kg)  03/16/23 207 lb (93.9 kg)   General: Alert, oriented, no distress.  Skin: normal turgor, no rashes, warm and dry HEENT: Normocephalic, atraumatic. Pupils equal round and reactive to light; sclera anicteric; extraocular muscles intact;  Nose without nasal septal hypertrophy Mouth/Parynx benign; Mallinpatti scale 3 Neck: No JVD, no carotid bruits; normal carotid upstroke Lungs: clear to ausculatation and percussion; no wheezing or rales Chest wall: without tenderness to palpitation Heart: PMI not displaced, RRR, s1 s2 normal, 1/6 systolic murmur, no diastolic murmur, no rubs, gallops, thrills, or heaves Abdomen: soft, nontender; no hepatosplenomehaly, BS+; abdominal aorta nontender and not dilated  by palpation. Back: no CVA tenderness Pulses 2+ Musculoskeletal: full range of motion, normal strength, no joint deformities Extremities: no clubbing cyanosis or edema, Homan's sign negative  Neurologic: grossly nonfocal; Cranial nerves grossly wnl Psychologic: Normal mood and affect     Studies/Labs Reviewed:   EKG Interpretation Date/Time:  Thursday May 04 2023 11:21:08 EST Ventricular Rate:  71 PR Interval:  210 QRS Duration:  162 QT Interval:  426 QTC Calculation: 462 R Axis:   -7  Text Interpretation: Sinus rhythm with 1st degree A-V block Left bundle branch block When compared with ECG of 01-Feb-2023 08:24, T wave inversion no longer evident in Inferior leads Confirmed by Burnard Ned ([id number]) on 05/06/2023 1:30:09 PM    I personally reviewed the ECG of May 02, 2019 which showed normal sinus rhythm at 82 with left bundle branch block and repolarization changes.  Recent Labs:    Latest Ref Rng & Units 03/14/2023    9:54 AM 02/01/2023    9:11 AM 11/03/2022    9:46 AM  BMP  Glucose 70 - 99 mg/dL 799  866  830   BUN 8 - 27 mg/dL 13  24  15    Creatinine 0.76 - 1.27 mg/dL 9.26  9.13  9.23   BUN/Creat Ratio 10 - 24 18  28  20   Sodium 134 - 144 mmol/L 140  139  141   Potassium 3.5 - 5.2 mmol/L 4.4  4.3  5.5   Chloride 96 - 106 mmol/L 102  103  103   CO2 20 - 29 mmol/L 28  24  24    Calcium  8.6 - 10.2 mg/dL 9.2  9.5  9.3         Latest Ref Rng & Units 11/03/2022    9:46 AM 04/13/2022    9:47 AM 01/11/2021    8:30 AM  Hepatic Function  Total Protein 6.0 - 8.5 g/dL 6.8  6.7  6.8   Albumin 3.9 - 4.9 g/dL 4.6  4.6  4.6   AST 0 - 40 IU/L 19  18  28    ALT 0 - 44 IU/L 27  19  31    Alk Phosphatase 44 - 121 IU/L 61  60  57   Total Bilirubin 0.0 - 1.2 mg/dL 0.4  0.3  <9.7        Latest Ref Rng & Units 03/14/2023    9:54 AM 09/13/2019    9:41 AM 04/30/2019   11:45 AM  CBC  WBC 3.4 - 10.8 x10E3/uL 7.0  8.7  8.3   Hemoglobin 13.0 - 17.7 g/dL 86.6  85.7  85.8    Hematocrit 37.5 - 51.0 % 40.2  42.7  42.1   Platelets 150 - 450 x10E3/uL 268  276.0  273    Lab Results  Component Value Date   MCV 93 03/14/2023   MCV 90.2 09/13/2019   MCV 87 04/30/2019   Lab Results  Component Value Date   TSH 2.36 05/06/2022   Lab Results  Component Value Date   HGBA1C 8.4 (H) 11/03/2022     BNP No results found for: BNP  ProBNP No results found for: PROBNP   Lipid Panel     Component Value Date/Time   CHOL 106 11/03/2022 0946   TRIG 110 11/03/2022 0946   HDL 57 11/03/2022 0946   CHOLHDL 2.1 04/13/2022 0947   CHOLHDL 3 09/13/2019 0941   VLDL 33.2 09/13/2019 0941   LDLCALC 29 11/03/2022 0946   LABVLDL 20 11/03/2022 0946     RADIOLOGY: No results found.   Additional studies/ records that were reviewed today include:   SPLIT NIGHT STUDY: 05/23/2019 SLEEP STUDY TECHNIQUE As per the AASM Manual for the Scoring of Sleep and Associated Events v2.3 (April 2016) with a hypopnea requiring 4% desaturations.   The channels recorded and monitored were frontal, central and occipital EEG, electrooculogram (EOG), submentalis EMG (chin), nasal and oral airflow, thoracic and abdominal wall motion, anterior tibialis EMG, snore microphone, electrocardiogram, and pulse oximetry. Bi-level positive airway pressure (BiPAP) was initiated when the patient met split night criteria and was titrated according to treat sleep-disordered breathing.   RESPIRATORY PARAMETERS Diagnostic Total AHI (/hr):            50.5     RDI (/hr):         76.1     OA Index (/hr):            16.1     CA Index (/hr):      0.0 REM AHI (/hr):            N/A      NREM AHI (/hr):          50.5     Supine AHI (/hr):         50.5  Non-supine AHI (/hr):        N/A Min O2 Sat (%):          76.0     Mean O2 (%):  91.9     Time below 88% (min):           15.3        Titration Optimal IPAP Pressure (cm): 18        Optimal EPAP Pressure (cm):            14        AHI at Optimal Pressure  (/hr):          0.0       Min O2 at Optimal Pressure (%):       93.0 Sleep % at Optimal (%):         8          Supine % at Optimal (%):       100           SLEEP ARCHITECTURE The study was initiated at 10:41:27 PM and terminated at 4:41:50 AM. The total recorded time was 360.4 minutes. EEG confirmed total sleep time was 257.5 minutes yielding a sleep efficiency of 71.5%%. Sleep onset after lights out was 24.9 minutes with a REM latency of 202.5 minutes. The patient spent 14.6%% of the night in stage N1 sleep, 74.6%% in stage N2 sleep, 2.7%% in stage N3 and 8.2% in REM. Wake after sleep onset (WASO) was 78.0 minutes. The Arousal Index was 62.2/hour.   LEG MOVEMENT DATA The total Periodic Limb Movements of Sleep (PLMS) were 0. The PLMS index was 0.0 .   CARDIAC DATA The 2 lead EKG demonstrated sinus rhythm. The mean heart rate was 100.0 beats per minute. Other EKG findings include: None.   IMPRESSIONS - Severe obstructive sleep apnea occurred during the diagnostic portion of the study (AHI  50.5 /h; RDI 76.1/h, with absence of REM sleep). CPAP was initiated at 6 cm,  titrated to 7 cm and was then transitioned to BiPAP therapy due to central events.  BiPAP was initated at 9/5 with maximum titration 18/14 cm of water. AHI at 18/14 was 0, but RDI 86.1 without any REM sleep. - No central sleep apnea occurred during the diagnostic portion of the study (CAI = 0.0/hour). - Severe oxygen desaturation during the diagnostic portion of the study to a nadir of 76.0%. - No snoring was audible during this study. - No cardiac abnormalities were noted during this study. - Clinically significant periodic limb movements of sleep did not occur during the study.   DIAGNOSIS - Obstructive Sleep Apnea (327.23 [G47.33 ICD-10])   RECOMMENDATIONS - Recommend an initial trial of BiPAP Auto therapy with an EPAP min of 13, PS of 4 and IPAP max of 25 cm H2O with heated humidification.  A Medium size Resmed Full Face Mask  AirFit F20 mask was used for the titration.  - Effort should be made to optimize nasal and oropharyngeal patency.  - Avoid alcohol, sedatives and other CNS depressants that may worsen sleep apnea and disrupt normal sleep architecture. - Sleep hygiene should be reviewed to assess factors that may improve sleep quality. - Weight management and regular exercise should be initiated or continued. - Recommend a download in 30 days and sleep Center for evaluation after 4 weeks of therapy.     ASSESSMENT:    1. OSA on BiPAP   2. Essential hypertension   3. Chronic combined systolic  and diastolic heart failure ([name])   4. Hyperlipidemia with target LDL less than 55   5. LBBB (left bundle branch block)   6. Type 2 diabetes mellitus with hyperglycemia, without long-term current use of insulin  [facility])     PLAN:  Travis Owens is a 68 year-old gentleman who is followed by Dr. Kate and has significant cardiovascular comorbidities including a history of chronic combined systolic and diastolic heart failure, hypertension, and hyperlipidemia.  He has been demonstrated to have mild nonobstructive plaque on coronary CTA and EF on CMR at 45% consistent with his echo reading.  Currently, he is on losartan  12.5 mg, spironolactone  12.5 mg, metoprolol  succinate 150 mg in addition to Jardiance  for his mildly reduced LV function.  In addition to Jardiance  he also is on metformin  and Mounjaro  for diabetes.  He is on Repatha  and atorvastatin  for hyperlipidemia.  He was found to have severe obstructive sleep apnea on his initial sleep evaluation with inability to achieve any REM sleep.  Oxygen desaturated to a nadir of 76%.  He had been started on BiPAP therapy with set up date November 11, 2019 with [employer] as his DME company.  At previous evaluations he was meeting compliance standards.  Apparently, as of October 28, 2021 he stopped using BiPAP.  Presently he believes he is sleeping adequately but can only sleep on his stomach.   When he sleeping on his stomach he is unaware of snoring and he does experience some dreams suggesting that he is achieving some RAM sleep.  He typically goes to bed at midnight and wakes up around 8 or 9 AM.  I again had a long discussion with him today regarding sleep apnea and potential adverse cardiovascular consequences if left untreated.  After much discussion, he would like to reinitiate therapy.  I will change his BiPAP settings slightly from his previous settings to allow him more time to accommodate prior to significant pressure elevation.  As result I will change his ramp start pressure to 6 from previous 8 and continue at 15 cm.  I will change his minimum EPAP pressure from 13 down to 11 and will continue IPAP max of 25.  At his last download in June 2023 AHI was significantly increased at 18.8.  I discussed using a different mask style we will try to obtain a ResMed AirFit F30 I mask which I believe he will tolerate well.  His blood pressure today is stable on current therapy.  I have recommended that we obtain a download in 1 month.  I again discussed optimal sleep duration with CPAP at 7 to 9 hours if at all possible.  I will be available to see him in follow-up evaluation until my retirement later this year.  However if further evaluation is necessary he will need to be transitioned to another sleep provider.    Medication Adjustments/Labs and Tests Ordered: Current medicines are reviewed at length with the patient today.  Concerns regarding medicines are outlined above.  Medication changes, Labs and Tests ordered today are listed in the Patient Instructions below. Patient Instructions  Medication Instructions:  The current medical regimen is effective. Continue present plan and medications as directed. Please refer to the Current Medication list given to you today.     *If you need a refill on your cardiac medications before your next appointment, please call your pharmacy*   Lab  Work: No labs were ordered during today's visit.    If you have labs (blood work)  drawn today and your tests are completely normal, you will receive your results only by: MyChart Message (if you have MyChart) OR A paper copy in the mail If you have any lab test that is abnormal or we need to change your treatment, we will call you to review the results.   Testing/Procedures: No procedures ordered today.     Follow-Up: At [facility], you and your health needs are our priority.  As part of our continuing mission to provide you with exceptional heart care, we have created designated Provider Care Teams.  These Care Teams include your primary Cardiologist (physician) and Advanced Practice Providers (APPs -  Physician Assistants and Nurse Practitioners) who all work together to provide you with the care you need, when you need it.  We recommend signing up for the patient portal called MyChart.  Sign up information is provided on this After Visit Summary.  MyChart is used to connect with patients for Virtual Visits (Telemedicine).  Patients are able to view lab/test results, encounter notes, upcoming appointments, etc.  Non-urgent messages can be sent to your provider as well.   To learn more about what you can do with MyChart, go to [email or web address].    Your next appointment:    2 month(s) for cpap download  Provider:   Debby Sor, MD    Other Instructions If you have any questions or concerns regarding your c-pap, bi-pap or sleep accessories, please contact Brandie Rorie at [phone].     Thank you for choosing [facility]!       Signed, Debby Sor, MD, [facility], ABSM Diplomatate, American Board of Sleep Medicine  05/06/2023 1:40 PM    [facility] [address], [address], [place], [place]  [address] Phone: [phone]

## 2023-05-04 NOTE — Patient Instructions (Signed)
 Medication Instructions:  The current medical regimen is effective. Continue present plan and medications as directed. Please refer to the Current Medication list given to you today.     *If you need a refill on your cardiac medications before your next appointment, please call your pharmacy*   Lab Work: No labs were ordered during today's visit.    If you have labs (blood work) drawn today and your tests are completely normal, you will receive your results only by: MyChart Message (if you have MyChart) OR A paper copy in the mail If you have any lab test that is abnormal or we need to change your treatment, we will call you to review the results.   Testing/Procedures: No procedures ordered today.     Follow-Up: At [facility], you and your health needs are our priority.  As part of our continuing mission to provide you with exceptional heart care, we have created designated Provider Care Teams.  These Care Teams include your primary Cardiologist (physician) and Advanced Practice Providers (APPs -  Physician Assistants and Nurse Practitioners) who all work together to provide you with the care you need, when you need it.  We recommend signing up for the patient portal called MyChart.  Sign up information is provided on this After Visit Summary.  MyChart is used to connect with patients for Virtual Visits (Telemedicine).  Patients are able to view lab/test results, encounter notes, upcoming appointments, etc.  Non-urgent messages can be sent to your provider as well.   To learn more about what you can do with MyChart, go to [email or web address].    Your next appointment:    2 month(s) for cpap download  Provider:   Debby Sor, MD    Other Instructions If you have any questions or concerns regarding your c-pap, bi-pap or sleep accessories, please contact Brandie Rorie at [phone].     Thank you for choosing [facility]!

## 2023-05-06 ENCOUNTER — Encounter: Payer: Self-pay | Admitting: Cardiovascular Disease

## 2023-05-09 ENCOUNTER — Ambulatory Visit (INDEPENDENT_AMBULATORY_CARE_PROVIDER_SITE_OTHER): Payer: 59 | Admitting: Family Medicine

## 2023-05-09 ENCOUNTER — Encounter: Payer: Self-pay | Admitting: Family Medicine

## 2023-05-09 VITALS — BP 122/68 | HR 90 | Temp 97.5°F | Ht 70.0 in | Wt 208.4 lb

## 2023-05-09 DIAGNOSIS — Z125 Encounter for screening for malignant neoplasm of prostate: Secondary | ICD-10-CM

## 2023-05-09 DIAGNOSIS — E538 Deficiency of other specified B group vitamins: Secondary | ICD-10-CM

## 2023-05-09 DIAGNOSIS — E785 Hyperlipidemia, unspecified: Secondary | ICD-10-CM | POA: Diagnosis not present

## 2023-05-09 DIAGNOSIS — I158 Other secondary hypertension: Secondary | ICD-10-CM

## 2023-05-09 DIAGNOSIS — E1165 Type 2 diabetes mellitus with hyperglycemia: Secondary | ICD-10-CM

## 2023-05-09 DIAGNOSIS — E1169 Type 2 diabetes mellitus with other specified complication: Secondary | ICD-10-CM | POA: Diagnosis not present

## 2023-05-09 DIAGNOSIS — E559 Vitamin D deficiency, unspecified: Secondary | ICD-10-CM

## 2023-05-09 DIAGNOSIS — Z0001 Encounter for general adult medical examination with abnormal findings: Secondary | ICD-10-CM

## 2023-05-09 DIAGNOSIS — Z23 Encounter for immunization: Secondary | ICD-10-CM

## 2023-05-09 DIAGNOSIS — F325 Major depressive disorder, single episode, in full remission: Secondary | ICD-10-CM

## 2023-05-09 DIAGNOSIS — Z7984 Long term (current) use of oral hypoglycemic drugs: Secondary | ICD-10-CM

## 2023-05-09 DIAGNOSIS — G4733 Obstructive sleep apnea (adult) (pediatric): Secondary | ICD-10-CM | POA: Diagnosis not present

## 2023-05-09 LAB — COMPREHENSIVE METABOLIC PANEL
ALT: 20 U/L (ref 0–53)
AST: 20 U/L (ref 0–37)
Albumin: 4.6 g/dL (ref 3.5–5.2)
Alkaline Phosphatase: 54 U/L (ref 39–117)
BUN: 20 mg/dL (ref 6–23)
CO2: 28 meq/L (ref 19–32)
Calcium: 9.7 mg/dL (ref 8.4–10.5)
Chloride: 102 meq/L (ref 96–112)
Creatinine, Ser: 0.94 mg/dL (ref 0.40–1.50)
GFR: 83.7 mL/min (ref >60.00)
Glucose, Bld: 104 mg/dL — ABNORMAL HIGH (ref 70–99)
Potassium: 4.6 meq/L (ref 3.5–5.1)
Sodium: 138 meq/L (ref 135–145)
Total Bilirubin: 0.5 mg/dL (ref 0.2–1.2)
Total Protein: 7 g/dL (ref 6.0–8.3)

## 2023-05-09 LAB — CBC
HCT: 44.6 % (ref 39.0–52.0)
Hemoglobin: 14.4 g/dL (ref 13.0–17.0)
MCHC: 32.2 g/dL (ref 30.0–36.0)
MCV: 93.2 fL (ref 78.0–100.0)
Platelets: 268 10*3/uL (ref 150.0–400.0)
RBC: 4.79 Mil/uL (ref 4.22–5.81)
RDW: 14.5 % (ref 11.5–15.5)
WBC: 8.3 10*3/uL (ref 4.0–10.5)

## 2023-05-09 LAB — VITAMIN D 25 HYDROXY (VIT D DEFICIENCY, FRACTURES): VITD: 35.69 ng/mL (ref 30.00–100.00)

## 2023-05-09 LAB — LIPID PANEL
Cholesterol: 135 mg/dL (ref 0–200)
HDL: 54.8 mg/dL (ref >39.00)
LDL Cholesterol: 39 mg/dL (ref 0–99)
NonHDL: 79.8
Total CHOL/HDL Ratio: 2
Triglycerides: 203 mg/dL — ABNORMAL HIGH (ref 0.0–149.0)
VLDL: 40.6 mg/dL — ABNORMAL HIGH (ref 0.0–40.0)

## 2023-05-09 LAB — HEMOGLOBIN A1C: Hgb A1c MFr Bld: 7.7 % — ABNORMAL HIGH (ref 4.6–6.5)

## 2023-05-09 LAB — TSH: TSH: 1.77 u[IU]/mL (ref 0.35–5.50)

## 2023-05-09 LAB — PSA: PSA: 3.39 ng/mL (ref 0.10–4.00)

## 2023-05-09 LAB — VITAMIN B12: Vitamin B-12: 627 pg/mL (ref 211–911)

## 2023-05-09 NOTE — Assessment & Plan Note (Signed)
 Check vitamin D.

## 2023-05-09 NOTE — Assessment & Plan Note (Signed)
 Check [name].  He was recently switched to Mounjaro  by weight loss physician.  He is on Mounjaro  10 mg weekly.  Also on metformin  1000 mg twice daily and Jardiance  10 mg daily.  He is working on lifestyle modifications as well.  If [name] is not at goal would consider increasing Mounjaro  12.5 mg weekly.

## 2023-05-09 NOTE — Assessment & Plan Note (Signed)
 Blood pressure at goal today on losartan 12.5 mg daily, spironolactone 12.5 mg daily, and metoprolol succinate 150 mg daily.

## 2023-05-09 NOTE — Assessment & Plan Note (Signed)
 Stable on Lexapro 20 mg daily.  Does not need refill today.

## 2023-05-09 NOTE — Assessment & Plan Note (Signed)
 Check B12

## 2023-05-09 NOTE — Assessment & Plan Note (Signed)
 Check lipids.  He is on Repatha and Lipitor per cardiology.

## 2023-05-09 NOTE — Assessment & Plan Note (Signed)
Stable.  Continue management per cardiology. 

## 2023-05-09 NOTE — Progress Notes (Signed)
 Chief Complaint:  Travis Owens is a 68 y.o. male who presents today for his annual comprehensive physical exam.    Assessment/Plan:  Chronic Problems Addressed Today: OSA treated with BiPAP Stable.  Continue management per cardiology.  Vitamin D  deficiency Check vitamin D .  Type 2 diabetes mellitus with hyperglycemia, without long-term current use of insulin  ([name]) Check [name].  He was recently switched to Mounjaro  by weight loss physician.  He is on Mounjaro  10 mg weekly.  Also on metformin  1000 mg twice daily and Jardiance  10 mg daily.  He is working on lifestyle modifications as well.  If [name] is not at goal would consider increasing Mounjaro  12.5 mg weekly.  Hyperlipidemia associated with type 2 diabetes mellitus ([name]) Check lipids.  He is on Repatha  and Lipitor per cardiology.  Depression, major, single episode, complete remission ([name]) Stable on Lexapro  20 mg daily.  Does not need refill today.  Other secondary hypertension Blood pressure at goal today on losartan  12.5 mg daily, spironolactone  12.5 mg daily, and metoprolol  succinate 150 mg daily.  B12 deficiency Check B12.   Preventative Healthcare: Check labs.  Flu vaccine given today.  Patient Counseling(The following topics were reviewed and/or handout was given):  -Nutrition: Stressed importance of moderation in sodium/caffeine intake, saturated fat and cholesterol, caloric balance, sufficient intake of fresh fruits, vegetables, and fiber.  -Stressed the importance of regular exercise.   -Substance Abuse: Discussed cessation/primary prevention of tobacco, alcohol, or other drug use; driving or other dangerous activities under the influence; availability of treatment for abuse.   -Injury prevention: Discussed safety belts, safety helmets, smoke detector, smoking near bedding or upholstery.   -Sexuality: Discussed sexually transmitted diseases, partner selection, use of condoms, avoidance of unintended pregnancy and  contraceptive alternatives.   -Dental health: Discussed importance of regular tooth brushing, flossing, and dental visits.  -Health maintenance and immunizations reviewed. Please refer to Health maintenance section.  Return to care in 1 year for next preventative visit.     Subjective:  HPI:  He has no acute complaints today. See Assessment / plan for status of chronic conditions.   Lifestyle Diet: Balanced. Plenty of fruits and vegetables.  Exercise: Very busy around the house. Trying to walk more.      05/09/2023   10:05 AM  Depression screen PHQ 2/9  Decreased Interest 1  Down, Depressed, Hopeless 1  PHQ - 2 Score 2  Altered sleeping 0  Tired, decreased energy 0  Change in appetite 0  Feeling bad or failure about yourself  1  Trouble concentrating 0  Moving slowly or fidgety/restless 0  Suicidal thoughts 0  PHQ-9 Score 3  Difficult doing work/chores Not difficult at all    Health Maintenance Due  Topic Date Due   FOOT EXAM  09/12/2020   HEMOGLOBIN [name]  05/06/2023     ROS: Per HPI, otherwise a complete review of systems was negative.   PMH:  The following were reviewed and entered/updated in epic: Past Medical History:  Diagnosis Date   Anxiety    Chest pain    Depression    Diabetes mellitus without complication ([name])    Fatty liver    GERD (gastroesophageal reflux disease)    Hyperlipidemia    Hypertension    Sleep apnea    has CPAP/does not use   Patient Active Problem List   Diagnosis Date Noted   Irritant contact dermatitis associated with stoma 03/16/2023   B12 deficiency 11/03/2022   Obesity, Beginning BMI 33.45  06/01/2022   Chronic systolic heart failure ([name]) 08/27/2020   Vitamin D  deficiency 06/24/2020   OSA treated with BiPAP 03/18/2020   Other secondary hypertension 12/05/2016   Type 2 diabetes mellitus with hyperglycemia, without long-term current use of insulin  ([name]) 12/05/2016   Hyperlipidemia associated with type 2 diabetes mellitus  ([name]) 12/05/2016   Depression, major, single episode, complete remission ([name]) 12/05/2016   Past Surgical History:  Procedure Laterality Date   COLONOSCOPY     Dr. Obie  2008    Family History  Problem Relation Age of Onset   Cancer Father        Lung Cancer   Stroke Father    Hypertension Paternal Grandmother    Stroke Paternal Grandfather    Colon cancer Neg Hx    Colon polyps Neg Hx    Esophageal cancer Neg Hx    Rectal cancer Neg Hx    Stomach cancer Neg Hx     Medications- reviewed and updated Current Outpatient Medications  Medication Sig Dispense Refill   acetaminophen (TYLENOL) 325 MG tablet Take 1 tablet by mouth as needed.     atorvastatin  (LIPITOR) 80 MG tablet Take 1 tablet (80 mg total) by mouth daily. 90 tablet 3   Blood Glucose Monitoring Suppl (FREESTYLE LITE) DEVI Check Blood sugar twice daily 1 Device 0   Cholecalciferol (VITAMIN D ) 50 MCG (2000 UT) CAPS Take 1 capsule (2,000 Units total) by mouth daily. 30 capsule 0   Cyanocobalamin  (B-12) 1000 MCG CAPS Take 1,000 Int'l Units by mouth daily in the afternoon.     empagliflozin  (JARDIANCE ) 10 MG TABS tablet Take 1 tablet (10 mg total) by mouth daily before breakfast. 90 tablet 3   escitalopram  (LEXAPRO ) 20 MG tablet Take 1 tablet (20 mg total) by mouth daily. 90 tablet 3   Evolocumab  (REPATHA  SURECLICK) 140 MG/ML SOAJ Inject 1 mL into the skin every 14 (fourteen) days. 6 mL 3   glucose blood (FREESTYLE LITE) test strip Check Blood sugar twice daily 100 each 12   ibuprofen (ADVIL) 200 MG tablet Take 1 tablet by mouth as needed.     Lancets (FREESTYLE) lancets Check blood sugar twice daily 100 each 12   losartan  (COZAAR ) 25 MG tablet Take 12.5 mg by mouth daily.     metFORMIN  (GLUCOPHAGE ) 1000 MG tablet Take 1 tablet (1,000 mg total) by mouth 2 (two) times daily. 180 tablet 3   metoprolol  succinate (TOPROL -XL) 100 MG 24 hr tablet Take 1&1/2 tablets (150 mg total) by mouth daily. 135 tablet 3   Multiple Vitamin  (MULTIVITAMIN) tablet Take 1 tablet by mouth daily.     pantoprazole  (PROTONIX ) 40 MG tablet Take 1 tablet (40 mg total) by mouth daily. 90 tablet 3   spironolactone  (ALDACTONE ) 25 MG tablet Take 1/2 tablet (12.5 mg total) by mouth daily. 45 tablet 1   tirzepatide  (MOUNJARO ) 10 MG/0.5ML Pen Inject 10 mg into the skin once a week. 2 mL 0   No current facility-administered medications for this visit.    Allergies-reviewed and updated No Known Allergies  Social History   Socioeconomic History   Marital status: Married    Spouse name: Gideon Burstein   Number of children: Not on file   Years of education: Not on file   Highest education level: Not on file  Occupational History   Occupation: Education Officer, Environmental    Comment: [employer]  Tobacco Use   Smoking status: Never   Smokeless tobacco: Never  Vaping Use  Vaping status: Never Used  Substance and Sexual Activity   Alcohol use: No   Drug use: No   Sexual activity: Yes  Other Topics Concern   Not on file  Social History Narrative   Not on file   Social Drivers of Health   Financial Resource Strain: Not on file  Food Insecurity: Not on file  Transportation Needs: Not on file  Physical Activity: Not on file  Stress: Not on file  Social Connections: Not on file        Objective:  Physical Exam: BP 122/68   Pulse 90   Temp (!) 97.5 F (36.4 C) (Temporal)   Ht 5' 10 (1.778 m)   Wt 208 lb 6.4 oz (94.5 kg)   SpO2 99%   BMI 29.90 kg/m   Body mass index is 29.9 kg/m. Wt Readings from Last 3 Encounters:  05/09/23 208 lb 6.4 oz (94.5 kg)  05/04/23 210 lb 12.8 oz (95.6 kg)  04/12/23 202 lb (91.6 kg)   Gen: NAD, resting comfortably HEENT: TMs normal bilaterally. OP clear. No thyromegaly noted.  CV: RRR with no murmurs appreciated Pulm: NWOB, CTAB with no crackles, wheezes, or rhonchi GI: Normal bowel sounds present. Soft, Nontender, Nondistended. MSK: no edema, cyanosis, or clubbing noted Skin: warm, dry Neuro:  CN2-12 grossly intact. Strength 5/5 in upper and lower extremities. Reflexes symmetric and intact bilaterally.  Psych: Normal affect and thought content     Momina Hunton M. Kennyth, MD 05/09/2023 10:39 AM

## 2023-05-09 NOTE — Patient Instructions (Signed)
 It was very nice to see you today!  We will check blood work today.  Please keep up the great work with diet and exercise.  Will see back in 6 months.  We may need to see you back sooner depending on results of your labs.  Return in about 6 months (around 11/06/2023) for Follow Up.   Take care, Dr Kennyth  PLEASE NOTE:  If you had any lab tests, please let us  know if you have not heard back within a few days. You may see your results on mychart before we have a chance to review them but we will give you a call once they are reviewed by us .   If we ordered any referrals today, please let us  know if you have not heard from their office within the next week.   If you had any urgent prescriptions sent in today, please check with the pharmacy within an hour of our visit to make sure the prescription was transmitted appropriately.   Please try these tips to maintain a healthy lifestyle:  Eat at least 3 REAL meals and 1-2 snacks per day.  Aim for no more than 5 hours between eating.  If you eat breakfast, please do so within one hour of getting up.   Each meal should contain half fruits/vegetables, one quarter protein, and one quarter carbs (no bigger than a computer mouse)  Cut down on sweet beverages. This includes juice, soda, and sweet tea.   Drink at least 1 glass of water with each meal and aim for at least 8 glasses per day  Exercise at least 150 minutes every week.    Preventive Care 66 Years and Older, Male Preventive care refers to lifestyle choices and visits with your health care provider that can promote health and wellness. Preventive care visits are also called wellness exams. What can I expect for my preventive care visit? Counseling During your preventive care visit, your health care provider may ask about your: Medical history, including: Past medical problems. Family medical history. History of falls. Current health, including: Emotional well-being. Home life and  relationship well-being. Sexual activity. Memory and ability to understand (cognition). Lifestyle, including: Alcohol, nicotine or tobacco, and drug use. Access to firearms. Diet, exercise, and sleep habits. Work and work astronomer. Sunscreen use. Safety issues such as seatbelt and bike helmet use. Physical exam Your health care provider will check your: Height and weight. These may be used to calculate your BMI (body mass index). BMI is a measurement that tells if you are at a healthy weight. Waist circumference. This measures the distance around your waistline. This measurement also tells if you are at a healthy weight and may help predict your risk of certain diseases, such as type 2 diabetes and high blood pressure. Heart rate and blood pressure. Body temperature. Skin for abnormal spots. What immunizations do I need?  Vaccines are usually given at various ages, according to a schedule. Your health care provider will recommend vaccines for you based on your age, medical history, and lifestyle or other factors, such as travel or where you work. What tests do I need? Screening Your health care provider may recommend screening tests for certain conditions. This may include: Lipid and cholesterol levels. Diabetes screening. This is done by checking your blood sugar (glucose) after you have not eaten for a while (fasting). Hepatitis C test. Hepatitis B test. HIV (human immunodeficiency virus) test. STI (sexually transmitted infection) testing, if you are at risk. Lung cancer  screening. Colorectal cancer screening. Prostate cancer screening. Abdominal aortic aneurysm (AAA) screening. You may need this if you are a current or former smoker. Talk with your health care provider about your test results, treatment options, and if necessary, the need for more tests. Follow these instructions at home: Eating and drinking  Eat a diet that includes fresh fruits and vegetables, whole  grains, lean protein, and low-fat dairy products. Limit your intake of foods with high amounts of sugar, saturated fats, and salt. Take vitamin and mineral supplements as recommended by your health care provider. Do not drink alcohol if your health care provider tells you not to drink. If you drink alcohol: Limit how much you have to 0-2 drinks a day. Know how much alcohol is in your drink. In the [place] one drink equals one 12 oz bottle of beer (355 mL), one 5 oz glass of wine (148 mL), or one 1 oz glass of hard liquor (44 mL). Lifestyle Brush your teeth every morning and night with fluoride toothpaste. Floss one time each day. Exercise for at least 30 minutes 5 or more days each week. Do not use any products that contain nicotine or tobacco. These products include cigarettes, chewing tobacco, and vaping devices, such as e-cigarettes. If you need help quitting, ask your health care provider. Do not use drugs. If you are sexually active, practice safe sex. Use a condom or other form of protection to prevent STIs. Take aspirin only as told by your health care provider. Make sure that you understand how much to take and what form to take. Work with your health care provider to find out whether it is safe and beneficial for you to take aspirin daily. Ask your health care provider if you need to take a cholesterol-lowering medicine (statin). Find healthy ways to manage stress, such as: Meditation, yoga, or listening to music. Journaling. Talking to a trusted person. Spending time with friends and family. Safety Always wear your seat belt while driving or riding in a vehicle. Do not drive: If you have been drinking alcohol. Do not ride with someone who has been drinking. When you are tired or distracted. While texting. If you have been using any mind-altering substances or drugs. Wear a helmet and other protective equipment during sports activities. If you have firearms in your house, make sure  you follow all gun safety procedures. Minimize exposure to UV radiation to reduce your risk of skin cancer. What's next? Visit your health care provider once a year for an annual wellness visit. Ask your health care provider how often you should have your eyes and teeth checked. Stay up to date on all vaccines. This information is not intended to replace advice given to you by your health care provider. Make sure you discuss any questions you have with your health care provider. Document Revised: 10/07/2020 Document Reviewed: 10/07/2020 Elsevier Patient Education  2024 [employer].

## 2023-05-11 ENCOUNTER — Encounter (INDEPENDENT_AMBULATORY_CARE_PROVIDER_SITE_OTHER): Payer: Self-pay | Admitting: Family Medicine

## 2023-05-11 ENCOUNTER — Other Ambulatory Visit (HOSPITAL_COMMUNITY): Payer: Self-pay

## 2023-05-11 ENCOUNTER — Ambulatory Visit (INDEPENDENT_AMBULATORY_CARE_PROVIDER_SITE_OTHER): Payer: 59 | Admitting: Family Medicine

## 2023-05-11 VITALS — BP 92/60 | HR 84 | Temp 98.6°F | Ht 70.0 in | Wt 199.0 lb

## 2023-05-11 DIAGNOSIS — I959 Hypotension, unspecified: Secondary | ICD-10-CM

## 2023-05-11 DIAGNOSIS — E669 Obesity, unspecified: Secondary | ICD-10-CM | POA: Diagnosis not present

## 2023-05-11 DIAGNOSIS — I9589 Other hypotension: Secondary | ICD-10-CM

## 2023-05-11 DIAGNOSIS — Z7985 Long-term (current) use of injectable non-insulin antidiabetic drugs: Secondary | ICD-10-CM | POA: Diagnosis not present

## 2023-05-11 DIAGNOSIS — E119 Type 2 diabetes mellitus without complications: Secondary | ICD-10-CM

## 2023-05-11 DIAGNOSIS — Z6828 Body mass index (BMI) 28.0-28.9, adult: Secondary | ICD-10-CM

## 2023-05-11 DIAGNOSIS — E1165 Type 2 diabetes mellitus with hyperglycemia: Secondary | ICD-10-CM

## 2023-05-11 DIAGNOSIS — Z7984 Long term (current) use of oral hypoglycemic drugs: Secondary | ICD-10-CM

## 2023-05-11 MED ORDER — TIRZEPATIDE 10 MG/0.5ML ~~LOC~~ SOAJ
10.0000 mg | SUBCUTANEOUS | 0 refills | Status: DC
  Filled 2023-05-11: qty 2, 28d supply, fill #0

## 2023-05-11 NOTE — Progress Notes (Signed)
.smr  Office: [phone]  /  Fax: [phone]  WEIGHT SUMMARY AND BIOMETRICS  Anthropometric Measurements Height: 5\' 10"  (1.778 m) Weight: 199 lb (90.3 kg) BMI (Calculated): 28.55 Weight at Last Visit: 202 lb Weight Lost Since Last Visit: 3 lb Weight Gained Since Last Visit: 0 Starting Weight: 220 lb Total Weight Loss (lbs): 21 lb (9.526 kg)   Body Composition  Body Fat %: 29.4 % Fat Mass (lbs): 58.6 lbs Muscle Mass (lbs): 133.8 lbs Total Body Water (lbs): 96.2 lbs Visceral Fat Rating : 16   Other Clinical Data Fasting: no Labs: no Today's Visit #: 55 Starting Date: 04/30/19    Chief Complaint: OBESITY   History of Present Illness   The patient, with a history of obesity and type two diabetes, has been actively working on diet, exercise, and weight loss to manage these conditions. He is currently on Mounjaro and metformin for diabetes management. Over the past month, he has successfully lost three pounds, even during the holiday season. However, he reports a decrease in exercise due to cold weather conditions.  The patient has been adhering to a protein-rich diet and has noticed a significant weight loss, breaking the 200-pound mark. He is unsure about his recent blood sugar levels but denies any symptoms of hypoglycemia. He has noticed a decrease in blood pressure to 92/60, but denies any associated symptoms such as lightheadedness or dizziness.  The patient's medication list includes spironolactone, metoprolol, losartan, and Jardiance, all of which could potentially affect blood pressure. He has already halved his losartan dosage. He has no known cardiac history and is scheduled for a cardiac MRI and a follow-up with his cardiologist in the coming months.  Despite the cold weather, the patient is considering indoor exercises such as mall walking to maintain his physical activity.          PHYSICAL EXAM:  Blood pressure 92/60, pulse 84, temperature 98.6 F (37  C), height 5\' 10"  (1.778 m), weight 199 lb (90.3 kg), SpO2 97%. Body mass index is 28.55 kg/m.  DIAGNOSTIC DATA REVIEWED:  BMET    Component Value Date/Time   NA 138 05/09/2023 1036   NA 140 03/14/2023 0954   K 4.6 05/09/2023 1036   CL 102 05/09/2023 1036   CO2 28 05/09/2023 1036   GLUCOSE 104 (H) 05/09/2023 1036   BUN 20 05/09/2023 1036   BUN 13 03/14/2023 0954   CREATININE 0.94 05/09/2023 1036   CALCIUM 9.7 05/09/2023 1036   GFRNONAA 106 04/30/2019 1145   GFRAA 122 04/30/2019 1145   Lab Results  Component Value Date   HGBA1C 7.7 (H) 05/09/2023   HGBA1C 9.4 12/05/2016   Lab Results  Component Value Date   INSULIN 23.9 11/03/2022   INSULIN 24.2 04/30/2019   Lab Results  Component Value Date   TSH 1.77 05/09/2023   CBC    Component Value Date/Time   WBC 8.3 05/09/2023 1036   RBC 4.79 05/09/2023 1036   HGB 14.4 05/09/2023 1036   HGB 13.3 03/14/2023 0954   HCT 44.6 05/09/2023 1036   HCT 40.2 03/14/2023 0954   PLT 268.0 05/09/2023 1036   PLT 268 03/14/2023 0954   MCV 93.2 05/09/2023 1036   MCV 93 03/14/2023 0954   MCH 30.8 03/14/2023 0954   MCHC 32.2 05/09/2023 1036   RDW 14.5 05/09/2023 1036   RDW 13.4 03/14/2023 0954   Iron Studies No results found for: "IRON", "TIBC", "FERRITIN", "IRONPCTSAT" Lipid Panel     Component Value Date/Time  CHOL 135 05/09/2023 1036   CHOL 106 11/03/2022 0946   TRIG 203.0 (H) 05/09/2023 1036   HDL 54.80 05/09/2023 1036   HDL 57 11/03/2022 0946   CHOLHDL 2 05/09/2023 1036   VLDL 40.6 (H) 05/09/2023 1036   LDLCALC 39 05/09/2023 1036   LDLCALC 29 11/03/2022 0946   Hepatic Function Panel     Component Value Date/Time   PROT 7.0 05/09/2023 1036   PROT 6.8 11/03/2022 0946   ALBUMIN 4.6 05/09/2023 1036   ALBUMIN 4.6 11/03/2022 0946   AST 20 05/09/2023 1036   ALT 20 05/09/2023 1036   ALKPHOS 54 05/09/2023 1036   BILITOT 0.5 05/09/2023 1036   BILITOT 0.4 11/03/2022 0946      Component Value Date/Time   TSH 1.77  05/09/2023 1036   Nutritional Lab Results  Component Value Date   VD25OH 35.69 05/09/2023   VD25OH 42.9 11/03/2022   VD25OH 37.52 05/06/2022     Assessment and Plan    Type 2 Diabetes Mellitus On Mounjaro, metformin, and Jardiance for type 2 diabetes. Blood sugars not recently monitored, no hypoglycemia symptoms. Jardiance benefits heart and kidney health. Discussed importance of regular blood sugar monitoring and medication benefits. - Refill Mounjaro - Continue metformin and Jardiance - Monitor blood sugars regularly -increase hydration  Hypotension Blood pressure 92/60, asymptomatic. On spironolactone, metoprolol, losartan, and Jardiance. Reduced losartan dose  previously. Discussed hydration and home blood pressure monitoring. Advised to discuss medication adjustments with cardiologist. - Increase hydration - Check blood pressure at home weekly - Discuss medication adjustments with cardiologist -will continue to monitor  Obesity Working on diet, exercise, and weight loss. Lost three pounds last month. Following eating plan 25% of the time, limited exercise due to cold weather. Discussed importance of weight loss and regular exercise. - Continue current diet and exercise regimen - Encourage walking in the mall during cold weather  General Health Maintenance Advised to maintain hydration, especially while on Jardiance and spironolactone. - Increase fluid intake  Follow-up 4 weeks -          He was informed of the importance of frequent follow up visits to maximize his success with intensive lifestyle modifications for his multiple health conditions.    Quillian Quince, MD

## 2023-05-11 NOTE — Progress Notes (Signed)
 [name] is better at 7.7 though still not quite at goal.  The rest of his labs are all at goal.  We can increase his Mounjaro  to 12.5 mg daily.  Please send in if he wishes.  He can also continue with current dose and we can recheck again in 3 to 6 months.  He should continue to work on diet and exercise as well.

## 2023-05-12 ENCOUNTER — Telehealth (HOSPITAL_COMMUNITY): Payer: Self-pay | Admitting: *Deleted

## 2023-05-12 NOTE — Telephone Encounter (Signed)
Patient returning call about his upcoming cardiac imaging study; pt verbalizes understanding of appt date/time, parking situation and where to check in; name and call back number provided for further questions should they arise  Larey Brick RN Navigator Cardiac Imaging [facility] [phone] office [phone] cell  Patient denies metal or claustrophobia.

## 2023-05-12 NOTE — Telephone Encounter (Signed)
 Attempted to call patient regarding upcoming cardiac MRI appointment. Left message on voicemail with name and callback number Johney Frame RN Navigator Cardiac Imaging [facility] [phone] Office

## 2023-05-15 ENCOUNTER — Ambulatory Visit (HOSPITAL_COMMUNITY)
Admission: RE | Admit: 2023-05-15 | Discharge: 2023-05-15 | Disposition: A | Discharge status: Home / Self Care | Payer: 59 | Source: Ambulatory Visit | Attending: Cardiology | Admitting: Cardiology

## 2023-05-15 ENCOUNTER — Other Ambulatory Visit: Payer: Self-pay | Admitting: Cardiology

## 2023-05-15 DIAGNOSIS — I5042 Chronic combined systolic (congestive) and diastolic (congestive) heart failure: Secondary | ICD-10-CM | POA: Insufficient documentation

## 2023-05-15 DIAGNOSIS — I2510 Atherosclerotic heart disease of native coronary artery without angina pectoris: Secondary | ICD-10-CM

## 2023-05-15 MED ORDER — GADOBUTROL 1 MMOL/ML IV SOLN
10.0000 mL | Freq: Once | INTRAVENOUS | Status: AC | PRN
  Administered 2023-05-15: 10 mL via INTRAVENOUS

## 2023-05-17 ENCOUNTER — Other Ambulatory Visit: Payer: Self-pay | Admitting: *Deleted

## 2023-05-17 ENCOUNTER — Other Ambulatory Visit (HOSPITAL_COMMUNITY): Payer: Self-pay

## 2023-05-17 MED ORDER — TIRZEPATIDE 12.5 MG/0.5ML ~~LOC~~ SOAJ
12.5000 mg | SUBCUTANEOUS | 1 refills | Status: DC
  Filled 2023-05-17: qty 6, 84d supply, fill #0

## 2023-05-22 ENCOUNTER — Encounter: Payer: Self-pay | Admitting: *Deleted

## 2023-06-08 ENCOUNTER — Ambulatory Visit (INDEPENDENT_AMBULATORY_CARE_PROVIDER_SITE_OTHER): Payer: 59 | Admitting: Family Medicine

## 2023-06-08 ENCOUNTER — Other Ambulatory Visit: Payer: Self-pay | Admitting: Cardiology

## 2023-06-08 ENCOUNTER — Other Ambulatory Visit: Payer: Self-pay | Admitting: Family Medicine

## 2023-06-08 ENCOUNTER — Other Ambulatory Visit (INDEPENDENT_AMBULATORY_CARE_PROVIDER_SITE_OTHER): Payer: Self-pay | Admitting: Family Medicine

## 2023-06-08 ENCOUNTER — Other Ambulatory Visit: Payer: Self-pay

## 2023-06-08 ENCOUNTER — Other Ambulatory Visit (HOSPITAL_COMMUNITY): Payer: Self-pay

## 2023-06-08 ENCOUNTER — Encounter (INDEPENDENT_AMBULATORY_CARE_PROVIDER_SITE_OTHER): Payer: Self-pay | Admitting: Family Medicine

## 2023-06-08 VITALS — BP 90/54 | HR 82 | Temp 98.3°F | Ht 70.0 in | Wt 200.0 lb

## 2023-06-08 DIAGNOSIS — E1165 Type 2 diabetes mellitus with hyperglycemia: Secondary | ICD-10-CM

## 2023-06-08 DIAGNOSIS — E119 Type 2 diabetes mellitus without complications: Secondary | ICD-10-CM

## 2023-06-08 DIAGNOSIS — Z7985 Long-term (current) use of injectable non-insulin antidiabetic drugs: Secondary | ICD-10-CM

## 2023-06-08 DIAGNOSIS — E1169 Type 2 diabetes mellitus with other specified complication: Secondary | ICD-10-CM

## 2023-06-08 DIAGNOSIS — Z7984 Long term (current) use of oral hypoglycemic drugs: Secondary | ICD-10-CM | POA: Diagnosis not present

## 2023-06-08 DIAGNOSIS — Z6828 Body mass index (BMI) 28.0-28.9, adult: Secondary | ICD-10-CM

## 2023-06-08 DIAGNOSIS — E669 Obesity, unspecified: Secondary | ICD-10-CM | POA: Diagnosis not present

## 2023-06-08 MED ORDER — PANTOPRAZOLE SODIUM 40 MG PO TBEC
40.0000 mg | DELAYED_RELEASE_TABLET | Freq: Every day | ORAL | 3 refills | Status: DC
  Filled 2023-06-08 – 2023-09-26 (×2): qty 90, 90d supply, fill #0
  Filled 2024-03-13: qty 90, 90d supply, fill #1

## 2023-06-08 MED ORDER — SPIRONOLACTONE 25 MG PO TABS
12.5000 mg | ORAL_TABLET | Freq: Every day | ORAL | 1 refills | Status: DC
  Filled 2023-06-08: qty 45, 90d supply, fill #0
  Filled 2023-09-04: qty 45, 90d supply, fill #1

## 2023-06-08 MED ORDER — ATORVASTATIN CALCIUM 80 MG PO TABS
80.0000 mg | ORAL_TABLET | Freq: Every day | ORAL | 3 refills | Status: DC
  Filled 2023-06-08: qty 90, 90d supply, fill #0
  Filled 2023-09-13 (×2): qty 90, 90d supply, fill #1
  Filled 2024-03-04: qty 90, 90d supply, fill #0

## 2023-06-08 MED ORDER — EMPAGLIFLOZIN 10 MG PO TABS
10.0000 mg | ORAL_TABLET | Freq: Every day | ORAL | 3 refills | Status: DC
  Filled 2023-06-08: qty 90, 90d supply, fill #0
  Filled 2023-09-13 (×2): qty 90, 90d supply, fill #1
  Filled 2024-01-01: qty 90, 90d supply, fill #0
  Filled 2024-04-19: qty 90, 90d supply, fill #1

## 2023-06-08 NOTE — Progress Notes (Signed)
.smr  Office: [phone]  /  Fax: [phone]  WEIGHT SUMMARY AND BIOMETRICS  Anthropometric Measurements Height: 5\' 10"  (1.778 m) Weight: 200 lb (90.7 kg) BMI (Calculated): 28.7 Weight at Last Visit: 199 lb Weight Lost Since Last Visit: 0 Weight Gained Since Last Visit: 1 lb Starting Weight: 220 lb Total Weight Loss (lbs): 20 lb (9.072 kg)   Body Composition  Body Fat %: 30.3 % Fat Mass (lbs): 60.8 lbs Muscle Mass (lbs): 132.8 lbs Total Body Water (lbs): 99.2 lbs Visceral Fat Rating : 17   Other Clinical Data Fasting: No Labs: No Today's Visit #: 56 Starting Date: 04/30/19    Chief Complaint: OBESITY   History of Present Illness   Travis Owens is a 68 year old male with obesity and type 2 diabetes who presents with weight management concerns.  He is experiencing challenges with weight management, having gained one pound in the last month. He follows his category four eating plan about fifty percent of the time and is not currently engaging in any exercise. Hunger is well controlled, but he struggles to meet his protein intake goals, particularly at breakfast. He sometimes has coffee and a banana for breakfast, which lacks protein. He is considering incorporating protein shakes and returning to eating eggs and [place] sausage to increase his protein intake.  He is managing type 2 diabetes with metformin, Jardiance, and Mounjaro. He occasionally eats at [facility], where he orders two pieces of meat without the bread to manage his carbohydrate intake. No changes in his medication regimen or other health issues have been reported.  He acknowledges the need to start exercising and considers using his total gym at home. He has been moving items to make space for it. Outdoor activities have been limited due to cold and rainy weather. He is aware of the importance of maintaining muscle mass, which has decreased over the fall, and is considering exercises to improve core  strength.          PHYSICAL EXAM:  Blood pressure (!) 90/54, pulse 82, temperature 98.3 F (36.8 C), height 5\' 10"  (1.778 m), weight 200 lb (90.7 kg), SpO2 97%. Body mass index is 28.7 kg/m.  DIAGNOSTIC DATA REVIEWED:  BMET    Component Value Date/Time   NA 138 05/09/2023 1036   NA 140 03/14/2023 0954   K 4.6 05/09/2023 1036   CL 102 05/09/2023 1036   CO2 28 05/09/2023 1036   GLUCOSE 104 (H) 05/09/2023 1036   BUN 20 05/09/2023 1036   BUN 13 03/14/2023 0954   CREATININE 0.94 05/09/2023 1036   CALCIUM 9.7 05/09/2023 1036   GFRNONAA 106 04/30/2019 1145   GFRAA 122 04/30/2019 1145   Lab Results  Component Value Date   HGBA1C 7.7 (H) 05/09/2023   HGBA1C 9.4 12/05/2016   Lab Results  Component Value Date   INSULIN 23.9 11/03/2022   INSULIN 24.2 04/30/2019   Lab Results  Component Value Date   TSH 1.77 05/09/2023   CBC    Component Value Date/Time   WBC 8.3 05/09/2023 1036   RBC 4.79 05/09/2023 1036   HGB 14.4 05/09/2023 1036   HGB 13.3 03/14/2023 0954   HCT 44.6 05/09/2023 1036   HCT 40.2 03/14/2023 0954   PLT 268.0 05/09/2023 1036   PLT 268 03/14/2023 0954   MCV 93.2 05/09/2023 1036   MCV 93 03/14/2023 0954   MCH 30.8 03/14/2023 0954   MCHC 32.2 05/09/2023 1036   RDW 14.5 05/09/2023 1036   RDW  13.4 03/14/2023 0954   Iron Studies No results found for: "IRON", "TIBC", "FERRITIN", "IRONPCTSAT" Lipid Panel     Component Value Date/Time   CHOL 135 05/09/2023 1036   CHOL 106 11/03/2022 0946   TRIG 203.0 (H) 05/09/2023 1036   HDL 54.80 05/09/2023 1036   HDL 57 11/03/2022 0946   CHOLHDL 2 05/09/2023 1036   VLDL 40.6 (H) 05/09/2023 1036   LDLCALC 39 05/09/2023 1036   LDLCALC 29 11/03/2022 0946   Hepatic Function Panel     Component Value Date/Time   PROT 7.0 05/09/2023 1036   PROT 6.8 11/03/2022 0946   ALBUMIN 4.6 05/09/2023 1036   ALBUMIN 4.6 11/03/2022 0946   AST 20 05/09/2023 1036   ALT 20 05/09/2023 1036   ALKPHOS 54 05/09/2023 1036    BILITOT 0.5 05/09/2023 1036   BILITOT 0.4 11/03/2022 0946      Component Value Date/Time   TSH 1.77 05/09/2023 1036   Nutritional Lab Results  Component Value Date   VD25OH 35.69 05/09/2023   VD25OH 42.9 11/03/2022   VD25OH 37.52 05/06/2022     Assessment and Plan    Obesity Recent weight gain of one pound over the last month. Following category four eating plan 50% of the time and not currently exercising. Decreased muscle mass, concerning for metabolism and fall risk. Discussed the importance of muscle mass for metabolism and fall prevention, especially core strength. Emphasized benefits of protein intake, particularly at breakfast, for muscle building. Studies show 30 grams of protein at breakfast builds more muscle with the same exercise compared to later intake. - Encourage use of total gym indoors for exercise - Start exercising a couple of times a week - Increase protein intake, especially at breakfast, to 30 grams - Suggest protein shakes with 30 grams of protein - Recommend eggs and [place] sausage for breakfast  Type 2 Diabetes Mellitus Managed with metformin, Jardiance, and Mounjaro. Currently on Mounjaro 10 mg, plans to increase to 12.5 mg. Higher doses may reduce appetite and protein intake, which could be counterproductive. Emphasized monitoring protein intake and adjusting Mounjaro dose if necessary. - Continue metformin, Jardiance, and Mounjaro - Monitor protein intake and adjust Mounjaro dose if protein intake becomes insufficient - Encourage high-protein meals and snacks to maintain muscle mass  General Health Maintenance Discussed dietary choices and exercise. Advised to choose leaner cuts of steak to manage calorie and cholesterol intake. - Advise choosing filet mignon or [place] strip over ribeye for lower calorie and cholesterol intake  Follow-up - Confirm next appointment on March 13th at 11:20 AM - Schedule April appointment.        He was informed of  the importance of frequent follow up visits to maximize his success with intensive lifestyle modifications for his multiple health conditions.    Quillian Quince, MD

## 2023-06-12 ENCOUNTER — Other Ambulatory Visit (HOSPITAL_COMMUNITY): Payer: Self-pay

## 2023-06-12 ENCOUNTER — Encounter (HOSPITAL_COMMUNITY): Payer: Self-pay

## 2023-06-28 ENCOUNTER — Other Ambulatory Visit (HOSPITAL_COMMUNITY): Payer: Self-pay

## 2023-06-28 ENCOUNTER — Other Ambulatory Visit: Payer: Self-pay | Admitting: Family Medicine

## 2023-06-28 DIAGNOSIS — E1165 Type 2 diabetes mellitus with hyperglycemia: Secondary | ICD-10-CM

## 2023-06-28 MED ORDER — METFORMIN HCL 1000 MG PO TABS
1000.0000 mg | ORAL_TABLET | Freq: Two times a day (BID) | ORAL | 3 refills | Status: AC
  Filled 2023-06-28 – 2023-09-26 (×3): qty 180, 90d supply, fill #0
  Filled 2024-03-04: qty 180, 90d supply, fill #1

## 2023-07-06 ENCOUNTER — Other Ambulatory Visit (HOSPITAL_COMMUNITY): Payer: Self-pay

## 2023-07-06 ENCOUNTER — Ambulatory Visit (INDEPENDENT_AMBULATORY_CARE_PROVIDER_SITE_OTHER): Payer: Medicare Other | Admitting: Family Medicine

## 2023-07-06 ENCOUNTER — Encounter (INDEPENDENT_AMBULATORY_CARE_PROVIDER_SITE_OTHER): Payer: Self-pay | Admitting: Family Medicine

## 2023-07-06 VITALS — BP 98/64 | HR 82 | Temp 98.1°F | Ht 70.0 in | Wt 199.0 lb

## 2023-07-06 DIAGNOSIS — E669 Obesity, unspecified: Secondary | ICD-10-CM | POA: Diagnosis not present

## 2023-07-06 DIAGNOSIS — Z7985 Long-term (current) use of injectable non-insulin antidiabetic drugs: Secondary | ICD-10-CM | POA: Diagnosis not present

## 2023-07-06 DIAGNOSIS — Z6828 Body mass index (BMI) 28.0-28.9, adult: Secondary | ICD-10-CM

## 2023-07-06 DIAGNOSIS — E1165 Type 2 diabetes mellitus with hyperglycemia: Secondary | ICD-10-CM

## 2023-07-06 DIAGNOSIS — Z7984 Long term (current) use of oral hypoglycemic drugs: Secondary | ICD-10-CM | POA: Diagnosis not present

## 2023-07-06 DIAGNOSIS — E119 Type 2 diabetes mellitus without complications: Secondary | ICD-10-CM | POA: Diagnosis not present

## 2023-07-06 DIAGNOSIS — I10 Essential (primary) hypertension: Secondary | ICD-10-CM

## 2023-07-06 MED ORDER — TIRZEPATIDE 12.5 MG/0.5ML ~~LOC~~ SOAJ
12.5000 mg | SUBCUTANEOUS | 1 refills | Status: DC
Start: 2023-07-06 — End: 2023-08-07
  Filled 2023-07-06: qty 2, 28d supply, fill #0

## 2023-07-06 NOTE — Progress Notes (Signed)
 Office: [phone]  /  Fax: [phone]  WEIGHT SUMMARY AND BIOMETRICS  Anthropometric Measurements Height: 5\' 10"  (1.778 m) Weight: 199 lb (90.3 kg) BMI (Calculated): 28.55 Weight at Last Visit: 200 lb Weight Lost Since Last Visit: 1 lb Weight Gained Since Last Visit: 0 lb Starting Weight: 220 lb Total Weight Loss (lbs): 21 lb (9.526 kg)   Body Composition  Body Fat %: 29.7 % Fat Mass (lbs): 59.2 lbs Muscle Mass (lbs): 133.4 lbs Total Body Water (lbs): 100 lbs Visceral Fat Rating : 16   Other Clinical Data Fasting: no Labs: no Today's Visit #: 57 Starting Date: 04/30/19    Chief Complaint: OBESITY .  History of Present Illness   The patient, with type 2 diabetes, presents for obesity treatment and progress evaluation.  He follows his category four eating plan intermittently, about thirty percent of the time, and has been increasing physical activity by doing yard work for sixty minutes twice a week. He was previously on Mounjaro 10 mg and increased to 12.5 mg last month, with no nausea or adverse effects. He notices better hunger control but is concerned about inadequate nutrition, particularly protein, and sometimes skips meals.  He has type 2 diabetes, with the most recent hemoglobin [name] at 7.7. He is on Jardiance 10 mg, metformin 1000 mg twice a day, and Mounjaro 12.5 mg weekly. He does not frequently check blood sugars at home and has never experienced hypoglycemia.  He enjoys attending model train shows and plans to attend one in [place] this Saturday. He has been working inside the house, Insurance account manager, and plans to resume walking in his neighborhood, which includes a mile and a half route with several hills. He feels 'pretty good' overall, with no lightheadedness, dizziness, or recent falls, and is sleeping well.          PHYSICAL EXAM:  Blood pressure 98/64, pulse 82, temperature 98.1 F (36.7 C), height 5\' 10"  (1.778 m), weight 199 lb  (90.3 kg), SpO2 96%. Body mass index is 28.55 kg/m.  DIAGNOSTIC DATA REVIEWED:  BMET    Component Value Date/Time   NA 138 05/09/2023 1036   NA 140 03/14/2023 0954   K 4.6 05/09/2023 1036   CL 102 05/09/2023 1036   CO2 28 05/09/2023 1036   GLUCOSE 104 (H) 05/09/2023 1036   BUN 20 05/09/2023 1036   BUN 13 03/14/2023 0954   CREATININE 0.94 05/09/2023 1036   CALCIUM 9.7 05/09/2023 1036   GFRNONAA 106 04/30/2019 1145   GFRAA 122 04/30/2019 1145   Lab Results  Component Value Date   HGBA1C 7.7 (H) 05/09/2023   HGBA1C 9.4 12/05/2016   Lab Results  Component Value Date   INSULIN 23.9 11/03/2022   INSULIN 24.2 04/30/2019   Lab Results  Component Value Date   TSH 1.77 05/09/2023   CBC    Component Value Date/Time   WBC 8.3 05/09/2023 1036   RBC 4.79 05/09/2023 1036   HGB 14.4 05/09/2023 1036   HGB 13.3 03/14/2023 0954   HCT 44.6 05/09/2023 1036   HCT 40.2 03/14/2023 0954   PLT 268.0 05/09/2023 1036   PLT 268 03/14/2023 0954   MCV 93.2 05/09/2023 1036   MCV 93 03/14/2023 0954   MCH 30.8 03/14/2023 0954   MCHC 32.2 05/09/2023 1036   RDW 14.5 05/09/2023 1036   RDW 13.4 03/14/2023 0954   Iron Studies No results found for: "IRON", "TIBC", "FERRITIN", "IRONPCTSAT" Lipid Panel     Component Value Date/Time  CHOL 135 05/09/2023 1036   CHOL 106 11/03/2022 0946   TRIG 203.0 (H) 05/09/2023 1036   HDL 54.80 05/09/2023 1036   HDL 57 11/03/2022 0946   CHOLHDL 2 05/09/2023 1036   VLDL 40.6 (H) 05/09/2023 1036   LDLCALC 39 05/09/2023 1036   LDLCALC 29 11/03/2022 0946   Hepatic Function Panel     Component Value Date/Time   PROT 7.0 05/09/2023 1036   PROT 6.8 11/03/2022 0946   ALBUMIN 4.6 05/09/2023 1036   ALBUMIN 4.6 11/03/2022 0946   AST 20 05/09/2023 1036   ALT 20 05/09/2023 1036   ALKPHOS 54 05/09/2023 1036   BILITOT 0.5 05/09/2023 1036   BILITOT 0.4 11/03/2022 0946      Component Value Date/Time   TSH 1.77 05/09/2023 1036   Nutritional Lab Results   Component Value Date   VD25OH 35.69 05/09/2023   VD25OH 42.9 11/03/2022   VD25OH 37.52 05/06/2022     Assessment and Plan    Type 2 Diabetes Mellitus Hemoglobin [name] is 7.7, with a target of under 7. Currently on Jardiance 10 mg, metformin 1000 mg twice daily, and Mounjaro 12.5 mg weekly. He does not regularly monitor blood glucose at home but has not experienced hypoglycemia. Discussed the importance of monitoring if hypoglycemia occurs, though not necessary currently. Mounjaro dose increased from 10 mg to 12.5 mg last month, well-tolerated without nausea. Discussed potential increase to 15 mg if needed. - Continue Jardiance 10 mg daily. - Continue metformin 1000 mg twice daily. - Continue Mounjaro 12.5 mg weekly. - Refill Mounjaro 12.5 mg prescription.  Obesity Adheres to category four eating plan intermittently (30%). Engages in physical activity, such as yard work, 60 minutes twice weekly. Emphasized adequate protein intake to prevent muscle loss and maintain metabolism, as previous muscle mass decline was noted. Advised to consume protein first during meals. Discussed body's resistance to weight loss and importance of muscle challenge to prevent further muscle loss. - Encourage adherence to the category four eating plan. - Advise consuming protein first during meals to prevent muscle loss. - Encourage regular physical activity, including yard work and walking on hilly terrain.  Hypertension Blood pressure is on the lower end of normal, which is positive as long as he is asymptomatic (no lightheadedness or dizziness). No recent falls.   Follow-up He believes his next appointment is already scheduled. Suggests scheduling the subsequent appointment. - Schedule the next follow-up appointment after the upcoming one.       He was informed of the importance of frequent follow up visits to maximize his success with intensive lifestyle modifications for his multiple health conditions.     Quillian Quince, MD

## 2023-07-25 ENCOUNTER — Ambulatory Visit: Payer: 59 | Admitting: Cardiovascular Disease

## 2023-08-07 ENCOUNTER — Ambulatory Visit (INDEPENDENT_AMBULATORY_CARE_PROVIDER_SITE_OTHER): Payer: Medicare Other | Admitting: Family Medicine

## 2023-08-07 ENCOUNTER — Encounter (INDEPENDENT_AMBULATORY_CARE_PROVIDER_SITE_OTHER): Payer: Self-pay | Admitting: Family Medicine

## 2023-08-07 ENCOUNTER — Other Ambulatory Visit (HOSPITAL_COMMUNITY): Payer: Self-pay

## 2023-08-07 VITALS — BP 101/64 | HR 70 | Temp 98.2°F | Ht 70.0 in | Wt 200.0 lb

## 2023-08-07 DIAGNOSIS — E119 Type 2 diabetes mellitus without complications: Secondary | ICD-10-CM

## 2023-08-07 DIAGNOSIS — Z6828 Body mass index (BMI) 28.0-28.9, adult: Secondary | ICD-10-CM

## 2023-08-07 DIAGNOSIS — I110 Hypertensive heart disease with heart failure: Secondary | ICD-10-CM

## 2023-08-07 DIAGNOSIS — Z7985 Long-term (current) use of injectable non-insulin antidiabetic drugs: Secondary | ICD-10-CM

## 2023-08-07 DIAGNOSIS — Z7984 Long term (current) use of oral hypoglycemic drugs: Secondary | ICD-10-CM

## 2023-08-07 DIAGNOSIS — I10 Essential (primary) hypertension: Secondary | ICD-10-CM

## 2023-08-07 DIAGNOSIS — E1169 Type 2 diabetes mellitus with other specified complication: Secondary | ICD-10-CM

## 2023-08-07 DIAGNOSIS — E669 Obesity, unspecified: Secondary | ICD-10-CM | POA: Diagnosis not present

## 2023-08-07 DIAGNOSIS — E785 Hyperlipidemia, unspecified: Secondary | ICD-10-CM

## 2023-08-07 DIAGNOSIS — I5022 Chronic systolic (congestive) heart failure: Secondary | ICD-10-CM | POA: Diagnosis not present

## 2023-08-07 DIAGNOSIS — E1165 Type 2 diabetes mellitus with hyperglycemia: Secondary | ICD-10-CM

## 2023-08-07 MED ORDER — TIRZEPATIDE 12.5 MG/0.5ML ~~LOC~~ SOAJ
12.5000 mg | SUBCUTANEOUS | 1 refills | Status: DC
  Filled 2023-08-07: qty 6, 84d supply, fill #0

## 2023-08-07 NOTE — Progress Notes (Signed)
 Office: [phone]  /  Fax: [phone]  WEIGHT SUMMARY AND BIOMETRICS  Anthropometric Measurements Height: 5\' 10"  (1.778 m) Weight: 200 lb (90.7 kg) BMI (Calculated): 28.7 Weight at Last Visit: 199lb Weight Lost Since Last Visit: 0lb Weight Gained Since Last Visit: 1lb Starting Weight: 220lb Total Weight Loss (lbs): 20 lb (9.072 kg)   Body Composition  Body Fat %: 30.3 % Fat Mass (lbs): 60.8 lbs Muscle Mass (lbs): 133 lbs Total Body Water (lbs): 101.4 lbs Visceral Fat Rating : 17   Other Clinical Data Fasting: No Labs: No Today's Visit #: 78 Starting Date: 04/30/19    Chief Complaint: OBESITY    History of Present Illness Travis Owens is a 68 year old male with obesity who presents for treatment and progress assessment.  He is currently undergoing treatment for obesity and follows a category four eating plan, adhering to it about 25% of the time. Despite this, he has gained one pound in the last month. He has increased his physical activity, engaging in yard work for about an hour three times a week.  He has a history of type 2 diabetes, with a recent hemoglobin [name] of 7.7, improved from 8.4. He is on [place], [place], and metformin, along with diet and exercise. He has not been tracking his blood sugars recently but plans to start checking them once a day, alternating between fasting and postprandial measurements. He experiences diarrhea, which he associates with carbohydrate intake, and finds that yogurt helps his stomach.  He manages his hypertension with diet, exercise, and medications including metoprolol 100 mg, losartan 25 mg, and spironolactone. His blood pressure is well controlled, with no issues of lightheadedness or dizziness.  He is on Lipitor 80 mg and Repatha for hyperlipidemia and is working on decreasing cholesterol in his diet. He reports no side effects from these medications.  He has chronic systolic heart failure and continues to work  on diet, exercise, and weight loss, including decreasing sodium intake. He reports no swelling in his hands or feet and no worsening shortness of breath.  He recently returned from a mission trip to [place], where he experienced a sore throat and mild chest congestion, which he managed with zinc. He reports feeling better and plans to resume yard work soon.      PHYSICAL EXAM:  Blood pressure 101/64, pulse 70, temperature 98.2 F (36.8 C), height 5\' 10"  (1.778 m), weight 200 lb (90.7 kg), SpO2 98%. Body mass index is 28.7 kg/m.  DIAGNOSTIC DATA REVIEWED:  BMET    Component Value Date/Time   NA 138 05/09/2023 1036   NA 140 03/14/2023 0954   K 4.6 05/09/2023 1036   CL 102 05/09/2023 1036   CO2 28 05/09/2023 1036   GLUCOSE 104 (H) 05/09/2023 1036   BUN 20 05/09/2023 1036   BUN 13 03/14/2023 0954   CREATININE 0.94 05/09/2023 1036   CALCIUM 9.7 05/09/2023 1036   GFRNONAA 106 04/30/2019 1145   GFRAA 122 04/30/2019 1145   Lab Results  Component Value Date   HGBA1C 7.7 (H) 05/09/2023   HGBA1C 9.4 12/05/2016   Lab Results  Component Value Date   INSULIN 23.9 11/03/2022   INSULIN 24.2 04/30/2019   Lab Results  Component Value Date   TSH 1.77 05/09/2023   CBC    Component Value Date/Time   WBC 8.3 05/09/2023 1036   RBC 4.79 05/09/2023 1036   HGB 14.4 05/09/2023 1036   HGB 13.3 03/14/2023 0954   HCT 44.6 05/09/2023  1036   HCT 40.2 03/14/2023 0954   PLT 268.0 05/09/2023 1036   PLT 268 03/14/2023 0954   MCV 93.2 05/09/2023 1036   MCV 93 03/14/2023 0954   MCH 30.8 03/14/2023 0954   MCHC 32.2 05/09/2023 1036   RDW 14.5 05/09/2023 1036   RDW 13.4 03/14/2023 0954   Iron Studies No results found for: "IRON", "TIBC", "FERRITIN", "IRONPCTSAT" Lipid Panel     Component Value Date/Time   CHOL 135 05/09/2023 1036   CHOL 106 11/03/2022 0946   TRIG 203.0 (H) 05/09/2023 1036   HDL 54.80 05/09/2023 1036   HDL 57 11/03/2022 0946   CHOLHDL 2 05/09/2023 1036   VLDL 40.6 (H)  05/09/2023 1036   LDLCALC 39 05/09/2023 1036   LDLCALC 29 11/03/2022 0946   Hepatic Function Panel     Component Value Date/Time   PROT 7.0 05/09/2023 1036   PROT 6.8 11/03/2022 0946   ALBUMIN 4.6 05/09/2023 1036   ALBUMIN 4.6 11/03/2022 0946   AST 20 05/09/2023 1036   ALT 20 05/09/2023 1036   ALKPHOS 54 05/09/2023 1036   BILITOT 0.5 05/09/2023 1036   BILITOT 0.4 11/03/2022 0946      Component Value Date/Time   TSH 1.77 05/09/2023 1036   Nutritional Lab Results  Component Value Date   VD25OH 35.69 05/09/2023   VD25OH 42.9 11/03/2022   VD25OH 37.52 05/06/2022     Assessment and Plan Assessment & Plan Type 2 Diabetes Mellitus Hemoglobin [name] improved to 7.7 from 8.4. Currently on Mounjaro, Jardiance, and metformin, with diet and exercise. Experiences diarrhea, likely related to carbohydrate intake while on Mounjaro. Discussed reducing simple carbohydrates to manage diarrhea and potential need to adjust Mounjaro dosage if gastrointestinal symptoms worsen. - Continue Mounjaro, Jardiance, and metformin - Monitor blood glucose levels once daily, alternating between fasting and postprandial - Educate on reducing simple carbohydrates to manage diarrhea - Send Mounjaro prescription to [facility] pharmacy -Continue diet and exercise   Obesity Adheres to category four eating plan 25% of the time. Gained one pound in the last month. Engages in yard work for one hour three times a week. Emphasized dietary adherence and physical activity in managing obesity. - Continue category four eating plan - Encourage increased adherence to dietary plan - Encourage regular physical activity  Chronic Systolic Heart Failure On spironolactone for management. Reports no worsening shortness of breath or swelling, and manages sodium intake well. - Continue spironolactone - Maintain low sodium diet  Hypertension Blood pressure well-controlled at 101/64 mmHg. On metoprolol 100 mg, losartan 25 mg, and  spironolactone. Emphasized maintaining a low sodium diet and regular exercise. - Continue metoprolol, losartan, and spironolactone - Maintain low sodium diet - Encourage regular exercise  Hyperlipidemia On Lipitor 80 mg and Repatha, with no side effects. Working on decreasing dietary cholesterol. - Continue Lipitor and Repatha - Encourage dietary modifications to reduce cholesterol    He was informed of the importance of frequent follow up visits to maximize his success with intensive lifestyle modifications for his multiple health conditions.    Jasmine Mesi, MD

## 2023-09-04 ENCOUNTER — Other Ambulatory Visit (HOSPITAL_COMMUNITY): Payer: Self-pay

## 2023-09-04 ENCOUNTER — Encounter (INDEPENDENT_AMBULATORY_CARE_PROVIDER_SITE_OTHER): Payer: Self-pay | Admitting: Family Medicine

## 2023-09-04 ENCOUNTER — Ambulatory Visit (INDEPENDENT_AMBULATORY_CARE_PROVIDER_SITE_OTHER): Admitting: Family Medicine

## 2023-09-04 VITALS — BP 90/62 | HR 76 | Temp 97.9°F | Ht 68.5 in | Wt 197.0 lb

## 2023-09-04 DIAGNOSIS — I959 Hypotension, unspecified: Secondary | ICD-10-CM | POA: Diagnosis not present

## 2023-09-04 DIAGNOSIS — E669 Obesity, unspecified: Secondary | ICD-10-CM

## 2023-09-04 DIAGNOSIS — E860 Dehydration: Secondary | ICD-10-CM

## 2023-09-04 DIAGNOSIS — R197 Diarrhea, unspecified: Secondary | ICD-10-CM

## 2023-09-04 DIAGNOSIS — E66811 Obesity, class 1: Secondary | ICD-10-CM

## 2023-09-04 DIAGNOSIS — Z6829 Body mass index (BMI) 29.0-29.9, adult: Secondary | ICD-10-CM

## 2023-09-04 NOTE — Progress Notes (Signed)
 Office: [phone]  /  Fax: [phone]  WEIGHT SUMMARY AND BIOMETRICS  Anthropometric Measurements Height: 5' 8.5" (1.74 m) (rechecked height today) Weight: 197 lb (89.4 kg) BMI (Calculated): 29.51 Weight at Last Visit: 200 lb Weight Lost Since Last Visit: 3 lb Weight Gained Since Last Visit: 0 Starting Weight: 220 lb Total Weight Loss (lbs): 23 lb (10.4 kg) Peak Weight: 240 lb   Body Composition  Body Fat %: 31.2 % Fat Mass (lbs): 61.6 lbs Muscle Mass (lbs): 129.2 lbs Total Body Water (lbs): 96 lbs Visceral Fat Rating : 17   Other Clinical Data Fasting: no Labs: no Today's Visit #: 71 Starting Date: 04/30/19 Comments: rechecked height today    Chief Complaint: OBESITY    History of Present Illness Travis Owens is a 68 year old male who presents for obesity treatment plan assessment and progress evaluation.  He is adhering to a category four eating plan 25% of the time and has lost three pounds over the last two months. He engages in physical activity, including yard work for four to six hours twice a week and walking a mile and a half route with significant hills almost every day. He is working on increasing his fluid intake to approximately 100 ounces daily, including three bottles of water and three bottles of sparkling ice.  He experienced dizziness all day yesterday, described as lightheadedness without vertigo, causing him to almost stumble into walls. This was the first occurrence of such dizziness. He also had diarrhea all day yesterday, which started in the morning and recurred after returning from church. He took anti-diarrheal medication to manage the symptoms. His blood pressure readings today were low, recorded at 87/55 and 90/62. He is currently on Jardiance , which can contribute to dehydration. He feels better today with no dizziness and the diarrhea has resolved. He is focusing on increasing his fluid intake, including drinking Gatorade for  electrolytes.  No recent issues with dizziness prior to yesterday and no ongoing dizziness today. The diarrhea was limited to yesterday and has since resolved.      PHYSICAL EXAM:  Blood pressure 90/62, pulse 76, temperature 97.9 F (36.6 C), height 5' 8.5" (1.74 m), weight 197 lb (89.4 kg), SpO2 97%. Body mass index is 29.52 kg/m.  DIAGNOSTIC DATA REVIEWED:  BMET    Component Value Date/Time   NA 138 05/09/2023 1036   NA 140 03/14/2023 0954   K 4.6 05/09/2023 1036   CL 102 05/09/2023 1036   CO2 28 05/09/2023 1036   GLUCOSE 104 (H) 05/09/2023 1036   BUN 20 05/09/2023 1036   BUN 13 03/14/2023 0954   CREATININE 0.94 05/09/2023 1036   CALCIUM  9.7 05/09/2023 1036   GFRNONAA 106 04/30/2019 1145   GFRAA 122 04/30/2019 1145   Lab Results  Component Value Date   HGBA1C 7.7 (H) 05/09/2023   HGBA1C 9.4 12/05/2016   Lab Results  Component Value Date   INSULIN  23.9 11/03/2022   INSULIN  24.2 04/30/2019   Lab Results  Component Value Date   TSH 1.77 05/09/2023   CBC    Component Value Date/Time   WBC 8.3 05/09/2023 1036   RBC 4.79 05/09/2023 1036   HGB 14.4 05/09/2023 1036   HGB 13.3 03/14/2023 0954   HCT 44.6 05/09/2023 1036   HCT 40.2 03/14/2023 0954   PLT 268.0 05/09/2023 1036   PLT 268 03/14/2023 0954   MCV 93.2 05/09/2023 1036   MCV 93 03/14/2023 0954   MCH 30.8 03/14/2023 0954  MCHC 32.2 05/09/2023 1036   RDW 14.5 05/09/2023 1036   RDW 13.4 03/14/2023 0954   Iron Studies No results found for: "IRON", "TIBC", "FERRITIN", "IRONPCTSAT" Lipid Panel     Component Value Date/Time   CHOL 135 05/09/2023 1036   CHOL 106 11/03/2022 0946   TRIG 203.0 (H) 05/09/2023 1036   HDL 54.80 05/09/2023 1036   HDL 57 11/03/2022 0946   CHOLHDL 2 05/09/2023 1036   VLDL 40.6 (H) 05/09/2023 1036   LDLCALC 39 05/09/2023 1036   LDLCALC 29 11/03/2022 0946   Hepatic Function Panel     Component Value Date/Time   PROT 7.0 05/09/2023 1036   PROT 6.8 11/03/2022 0946    ALBUMIN 4.6 05/09/2023 1036   ALBUMIN 4.6 11/03/2022 0946   AST 20 05/09/2023 1036   ALT 20 05/09/2023 1036   ALKPHOS 54 05/09/2023 1036   BILITOT 0.5 05/09/2023 1036   BILITOT 0.4 11/03/2022 0946      Component Value Date/Time   TSH 1.77 05/09/2023 1036   Nutritional Lab Results  Component Value Date   VD25OH 35.69 05/09/2023   VD25OH 42.9 11/03/2022   VD25OH 37.52 05/06/2022     Assessment and Plan Assessment & Plan Hypotension  Dizziness and lightheadedness likely due to volume depletion from diarrhea and low fluid intake. Blood pressure readings were 87/55 and 90/62. Jardiance  may contribute to dehydration. - Increase fluid intake, including water and electrolyte-rich drinks like Gatorade and bouillon soup. - Avoid caffeine-containing beverages for hydration. - Contact primary care or cardiologist if dizziness persists.  Diarrhea Acute episode on Sunday likely contributed to volume depletion and hypotension. Symptoms have resolved with no further episodes reported. - Monitor for recurrence. - Contact primary care or cardiologist if symptoms persist or recur.  Obesity On a category four eating plan 25% of the time, resulting in a 3-pound weight loss over two months. Engages in yard work for 4-6 hours twice a week and plans to walk regularly. Hunger is controlled, and he does not feel deprived. Exercise is viewed as essential for mental and emotional well-being. - Continue category four eating plan. - Encourage regular physical activity, including walking at least six days a week.      He was informed of the importance of frequent follow up visits to maximize his success with intensive lifestyle modifications for his multiple health conditions.    Jasmine Mesi, MD

## 2023-09-13 ENCOUNTER — Other Ambulatory Visit (HOSPITAL_COMMUNITY): Payer: Self-pay

## 2023-09-13 DIAGNOSIS — L814 Other melanin hyperpigmentation: Secondary | ICD-10-CM | POA: Diagnosis not present

## 2023-09-13 DIAGNOSIS — L821 Other seborrheic keratosis: Secondary | ICD-10-CM | POA: Diagnosis not present

## 2023-09-13 DIAGNOSIS — D225 Melanocytic nevi of trunk: Secondary | ICD-10-CM | POA: Diagnosis not present

## 2023-09-13 DIAGNOSIS — L918 Other hypertrophic disorders of the skin: Secondary | ICD-10-CM | POA: Diagnosis not present

## 2023-09-19 ENCOUNTER — Other Ambulatory Visit (HOSPITAL_COMMUNITY): Payer: Self-pay

## 2023-09-26 ENCOUNTER — Other Ambulatory Visit (HOSPITAL_BASED_OUTPATIENT_CLINIC_OR_DEPARTMENT_OTHER): Payer: Self-pay

## 2023-09-27 ENCOUNTER — Ambulatory Visit: Admitting: Cardiovascular Disease

## 2023-10-10 ENCOUNTER — Ambulatory Visit (INDEPENDENT_AMBULATORY_CARE_PROVIDER_SITE_OTHER): Admitting: Family Medicine

## 2023-10-10 ENCOUNTER — Other Ambulatory Visit (HOSPITAL_BASED_OUTPATIENT_CLINIC_OR_DEPARTMENT_OTHER): Payer: Self-pay

## 2023-10-10 ENCOUNTER — Encounter (INDEPENDENT_AMBULATORY_CARE_PROVIDER_SITE_OTHER): Payer: Self-pay | Admitting: Family Medicine

## 2023-10-10 VITALS — BP 100/62 | HR 75 | Temp 98.3°F | Ht 68.5 in | Wt 201.0 lb

## 2023-10-10 DIAGNOSIS — Z6830 Body mass index (BMI) 30.0-30.9, adult: Secondary | ICD-10-CM

## 2023-10-10 DIAGNOSIS — Z6833 Body mass index (BMI) 33.0-33.9, adult: Secondary | ICD-10-CM

## 2023-10-10 DIAGNOSIS — Z7984 Long term (current) use of oral hypoglycemic drugs: Secondary | ICD-10-CM

## 2023-10-10 DIAGNOSIS — I10 Essential (primary) hypertension: Secondary | ICD-10-CM

## 2023-10-10 DIAGNOSIS — E669 Obesity, unspecified: Secondary | ICD-10-CM

## 2023-10-10 DIAGNOSIS — E119 Type 2 diabetes mellitus without complications: Secondary | ICD-10-CM | POA: Diagnosis not present

## 2023-10-10 DIAGNOSIS — Z7985 Long-term (current) use of injectable non-insulin antidiabetic drugs: Secondary | ICD-10-CM | POA: Diagnosis not present

## 2023-10-10 DIAGNOSIS — E1169 Type 2 diabetes mellitus with other specified complication: Secondary | ICD-10-CM

## 2023-10-10 MED ORDER — TIRZEPATIDE 12.5 MG/0.5ML ~~LOC~~ SOAJ
12.5000 mg | SUBCUTANEOUS | 1 refills | Status: DC
  Filled 2023-10-10: qty 2, 28d supply, fill #0
  Filled 2023-10-16: qty 6, 84d supply, fill #0

## 2023-10-10 NOTE — Progress Notes (Signed)
 Office: [phone]  /  Fax: [phone]  WEIGHT SUMMARY AND BIOMETRICS  Anthropometric Measurements Height: 5' 8.5 (1.74 m) Weight: 201 lb (91.2 kg) BMI (Calculated): 30.11 Weight at Last Visit: 197 lb Weight Lost Since Last Visit: 0 Weight Gained Since Last Visit: 4 lb Starting Weight: 220 lb Total Weight Loss (lbs): 19 lb (8.618 kg) Peak Weight: 240 lb   Body Composition  Body Fat %: 31.3 % Fat Mass (lbs): 63 lbs Muscle Mass (lbs): 131.4 lbs Total Body Water (lbs): 98.8 lbs Visceral Fat Rating : 18   Other Clinical Data Fasting: no Labs: no Today's Visit #: 60 Starting Date: 04/30/19    Chief Complaint: OBESITY    History of Present Illness Travis Owens is a 68 year old male with obesity and type 2 diabetes who presents for obesity treatment.  He is adhering to the category four eating plan approximately 40% of the time. Despite increased physical activity, including yard work two to three times a week for two to four hours, he has gained four pounds since his last visit. He acknowledges consuming more sweets and not meeting his protein intake goals. A recent disruption in his routine due to acquiring a new pet has also been noted.  Regarding his type 2 diabetes, he is currently on Mounjaro  12.5 mg, metformin , and Jardiance . He experiences no gastrointestinal issues with Mounjaro , except for a single episode of diarrhea attributed to stress. No constipation is reported. Occasionally, he experiences symptoms suggestive of hypoglycemia, such as feeling 'hot, weak, and shaky,' but attributes these to rapid movements rather than medication effects. His last [name] was 7.7, down from 8.4 the previous year.  His blood pressure is stable at 100/62 mmHg. He is currently taking losartan  25 mg and spironolactone  12.5 mg. No lightheadedness or dizziness is reported. He mentions hydrating well, although he notes a decrease in fluid intake over the past few days.  Socially,  he has recently acquired a new kitten, which has brought joy to his household. He is involved in church activities, including an upcoming ladies' conference where he will be serving food.      PHYSICAL EXAM:  Blood pressure 100/62, pulse 75, temperature 98.3 F (36.8 C), height 5' 8.5 (1.74 m), weight 201 lb (91.2 kg), SpO2 96%. Body mass index is 30.12 kg/m.  DIAGNOSTIC DATA REVIEWED:  BMET    Component Value Date/Time   NA 138 05/09/2023 1036   NA 140 03/14/2023 0954   K 4.6 05/09/2023 1036   CL 102 05/09/2023 1036   CO2 28 05/09/2023 1036   GLUCOSE 104 (H) 05/09/2023 1036   BUN 20 05/09/2023 1036   BUN 13 03/14/2023 0954   CREATININE 0.94 05/09/2023 1036   CALCIUM  9.7 05/09/2023 1036   GFRNONAA 106 04/30/2019 1145   GFRAA 122 04/30/2019 1145   Lab Results  Component Value Date   HGBA1C 7.7 (H) 05/09/2023   HGBA1C 9.4 12/05/2016   Lab Results  Component Value Date   INSULIN  23.9 11/03/2022   INSULIN  24.2 04/30/2019   Lab Results  Component Value Date   TSH 1.77 05/09/2023   CBC    Component Value Date/Time   WBC 8.3 05/09/2023 1036   RBC 4.79 05/09/2023 1036   HGB 14.4 05/09/2023 1036   HGB 13.3 03/14/2023 0954   HCT 44.6 05/09/2023 1036   HCT 40.2 03/14/2023 0954   PLT 268.0 05/09/2023 1036   PLT 268 03/14/2023 0954   MCV 93.2 05/09/2023 1036   MCV  93 03/14/2023 0954   MCH 30.8 03/14/2023 0954   MCHC 32.2 05/09/2023 1036   RDW 14.5 05/09/2023 1036   RDW 13.4 03/14/2023 0954   Iron Studies No results found for: IRON, TIBC, FERRITIN, IRONPCTSAT Lipid Panel     Component Value Date/Time   CHOL 135 05/09/2023 1036   CHOL 106 11/03/2022 0946   TRIG 203.0 (H) 05/09/2023 1036   HDL 54.80 05/09/2023 1036   HDL 57 11/03/2022 0946   CHOLHDL 2 05/09/2023 1036   VLDL 40.6 (H) 05/09/2023 1036   LDLCALC 39 05/09/2023 1036   LDLCALC 29 11/03/2022 0946   Hepatic Function Panel     Component Value Date/Time   PROT 7.0 05/09/2023 1036   PROT  6.8 11/03/2022 0946   ALBUMIN 4.6 05/09/2023 1036   ALBUMIN 4.6 11/03/2022 0946   AST 20 05/09/2023 1036   ALT 20 05/09/2023 1036   ALKPHOS 54 05/09/2023 1036   BILITOT 0.5 05/09/2023 1036   BILITOT 0.4 11/03/2022 0946      Component Value Date/Time   TSH 1.77 05/09/2023 1036   Nutritional Lab Results  Component Value Date   VD25OH 35.69 05/09/2023   VD25OH 42.9 11/03/2022   VD25OH 37.52 05/06/2022     Assessment and Plan Assessment & Plan Type 2 Diabetes Mellitus [name] improved to 7.7 from 8.4 last year, with a target of under 7. He is on Mounjaro , metformin , and Jardiance , with no significant gastrointestinal issues from Mounjaro . Hypoglycemic symptoms are attributed to positional changes, not medication. [name] testing is planned during the upcoming primary care visit. - Continue Mounjaro , metformin , and Jardiance  - Monitor for hypoglycemic symptoms - Plan for [name] testing during upcoming primary care visit  Obesity Adheres to the category four eating plan 40% of the time, with a four-pound weight gain since the last visit. Increased physical activity through yard work, but dietary adherence is inconsistent. Emphasized protein intake over sweets and discussed the impact of routine and hydration on weight management. Prefers to continue the current eating plan. - Continue category four eating plan - Encourage increased adherence to dietary plan - Emphasize protein intake over sweets - Encourage regular hydration - Reassess weight and dietary adherence in one month  Hypertension Blood pressure is well-managed at 100/62 mmHg on losartan  25 mg and spironolactone  12.5 mg. Risk of hypotension with weight loss, but no symptoms of lightheadedness or dizziness. - Continue losartan  and spironolactone  - Monitor blood pressure regularly - Assess for symptoms of hypotension  General Health Maintenance Engages in regular physical activity through yard work, including mowing, trimming  bushes, and lifting, beneficial for overall health and weight management. Performs exercises to prevent back strain effectively. - Encourage continued physical activity through yard work - Advise on proper stretching techniques to prevent back strain  Follow-up Upcoming appointments with primary care provider and cardiologist. Next appointment with current provider on July 29, with a recommendation to schedule another at the end of August. - Follow up with primary care provider in July for [name] testing - Schedule follow-up appointment with current provider for end of August - Attend cardiology appointment in early July      He was informed of the importance of frequent follow up visits to maximize his success with intensive lifestyle modifications for his multiple health conditions.    Jasmine Mesi, MD

## 2023-10-16 ENCOUNTER — Other Ambulatory Visit (HOSPITAL_BASED_OUTPATIENT_CLINIC_OR_DEPARTMENT_OTHER): Payer: Self-pay

## 2023-10-18 ENCOUNTER — Other Ambulatory Visit: Payer: Self-pay | Admitting: Family Medicine

## 2023-10-18 ENCOUNTER — Other Ambulatory Visit (HOSPITAL_BASED_OUTPATIENT_CLINIC_OR_DEPARTMENT_OTHER): Payer: Self-pay

## 2023-10-18 DIAGNOSIS — E1159 Type 2 diabetes mellitus with other circulatory complications: Secondary | ICD-10-CM

## 2023-10-19 ENCOUNTER — Telehealth: Payer: Self-pay | Admitting: Family Medicine

## 2023-10-19 ENCOUNTER — Telehealth: Payer: Self-pay | Admitting: Cardiology

## 2023-10-19 ENCOUNTER — Other Ambulatory Visit (HOSPITAL_BASED_OUTPATIENT_CLINIC_OR_DEPARTMENT_OTHER): Payer: Self-pay

## 2023-10-19 MED ORDER — LOSARTAN POTASSIUM 25 MG PO TABS
12.5000 mg | ORAL_TABLET | Freq: Every day | ORAL | 0 refills | Status: DC
  Filled 2023-10-19: qty 45, 90d supply, fill #0

## 2023-10-19 NOTE — Telephone Encounter (Signed)
 Copied from CRM [id number]. Topic: Clinical - Medication Refill >> Oct 19, 2023 11:48 AM Thersia C wrote: Medication: losartan  (COZAAR ) 25 MG tablet -- patient stated he has been taking the medication, pharmacy stated that they was unable to refill it because the provider stated patient want taking it anymore, Cardiologist suggested to take them in half at 12.5 mg , so he has been taking it since like that, stated he is out and is needing   Has the patient contacted their pharmacy? Yes (Agent: If no, request that the patient contact the pharmacy for the refill. If patient does not wish to contact the pharmacy document the reason why and proceed with request.) (Agent: If yes, when and what did the pharmacy advise?)  This is the patient's preferred pharmacy:  [facility] [address], [address] [place] [address] Phone: [phone] Fax: [phone]   Is this the correct pharmacy for this prescription? Yes If no, delete pharmacy and type the correct one.   Has the prescription been filled recently? No  Is the patient out of the medication? Yes  Has the patient been seen for an appointment in the last year OR does the patient have an upcoming appointment? Yes  Can we respond through MyChart? Yes  Agent: Please be advised that Rx refills may take up to 3 business days. We ask that you follow-up with your pharmacy.

## 2023-10-19 NOTE — Telephone Encounter (Signed)
 90 DAYS 180 DAYS   *STAT* If patient is at the pharmacy, call can be transferred to refill team.   1. Which medications need to be refilled? (please list name of each medication and dose if known) losartan  (COZAAR ) 25 MG tablet  2. Which pharmacy/location (including street and city if local pharmacy) is medication to be sent to? [facility]   3. Do they need a 30 day or 90 day supply?  90 DAY SUPPLY

## 2023-10-19 NOTE — Telephone Encounter (Signed)
 Pt's medication was sent to pt's pharmacy as requested. Confirmation received.

## 2023-10-25 DIAGNOSIS — Z0100 Encounter for examination of eyes and vision without abnormal findings: Secondary | ICD-10-CM | POA: Diagnosis not present

## 2023-10-25 LAB — HM DIABETES EYE EXAM

## 2023-10-30 ENCOUNTER — Encounter: Payer: Self-pay | Admitting: Cardiology

## 2023-10-30 ENCOUNTER — Ambulatory Visit: Attending: Cardiology | Admitting: Cardiology

## 2023-10-30 VITALS — BP 114/82 | HR 70 | Ht 69.0 in | Wt 209.6 lb

## 2023-10-30 DIAGNOSIS — E785 Hyperlipidemia, unspecified: Secondary | ICD-10-CM

## 2023-10-30 DIAGNOSIS — G4733 Obstructive sleep apnea (adult) (pediatric): Secondary | ICD-10-CM

## 2023-10-30 DIAGNOSIS — I5022 Chronic systolic (congestive) heart failure: Secondary | ICD-10-CM | POA: Diagnosis not present

## 2023-10-30 DIAGNOSIS — I2510 Atherosclerotic heart disease of native coronary artery without angina pectoris: Secondary | ICD-10-CM | POA: Diagnosis not present

## 2023-10-30 DIAGNOSIS — I10 Essential (primary) hypertension: Secondary | ICD-10-CM | POA: Diagnosis not present

## 2023-10-30 DIAGNOSIS — I5042 Chronic combined systolic (congestive) and diastolic (congestive) heart failure: Secondary | ICD-10-CM | POA: Diagnosis not present

## 2023-10-30 LAB — BASIC METABOLIC PANEL WITH GFR
BUN/Creatinine Ratio: 20 (ref 10–24)
BUN: 16 mg/dL (ref 8–27)
CO2: 22 mmol/L (ref 20–29)
Calcium: 9.7 mg/dL (ref 8.6–10.2)
Chloride: 103 mmol/L (ref 96–106)
Creatinine, Ser: 0.79 mg/dL (ref 0.76–1.27)
Glucose: 127 mg/dL — ABNORMAL HIGH (ref 70–99)
Potassium: 4.9 mmol/L (ref 3.5–5.2)
Sodium: 143 mmol/L (ref 134–144)
eGFR: 97 mL/min/1.73 (ref >59)

## 2023-10-30 NOTE — Progress Notes (Signed)
 Cardiology Office Note:    Date:  10/30/2023   ID:  Abanoub Hanken, DOB [birth year], MRN [id number]  PCP:  Kennyth Worth HERO, MD  Cardiologist:  Lonni LITTIE Nanas, MD  Electrophysiologist:  None   Referring MD: Kennyth Worth HERO, MD   Chief Complaint  Patient presents with   Congestive Heart Failure    History of Present Illness:    Travis Owens is a 68 y.o. male with a hx of chronic combined systolic and diastolic heart failure, type 2 diabetes, hypertension, hyperlipidemia, OSA who present for follow-up.  He was is referred by Dr. Verdon for evaluation of left bundle branch block on 05/01/18.  He was referred to Dr. Verdon for weight loss evaluation, an EKG was done which showed left bundle branch block.  TTE was done on 05/03/2019 and showed EF 40 to 45%, with marked marked septal-lateral dyssynchrony consistent with LBBB.  Coronary CTA on 06/06/2019 showed nonobstructive CAD (calcified plaque in the RPDA causing mild (25-49%) stenosis and calcified plaque in the proximal LAD and proximal RCA causing minimal (0-24%) stenosis).  Calcium  score 111 (62nd percentile for age/gender).  CMR on 07/25/2019 showed EF 45%, no LGE, RV EF 37%.  Echocardiogram 10/01/2021 showed EF 45 to 50%, grade 1 diastolic dysfunction, dyssynchrony due to LBBB, normal RV function, no significant valvular disease.  Echocardiogram 02/2023 showed EF 35 to 40%.  Cardiac MRI 04/2023 showed LVEF 42%, RVEF 57%, RV insertion site LGE.  Since last clinic visit, he reports he is doing well.  Denies any chest pain, dyspnea, lower extremity edema, or palpitations.  Reports some lightheadedness with standing too quickly but no syncopal episodes.  Has not been using his BiPAP.  Wt Readings from Last 3 Encounters:  10/30/23 209 lb 9.6 oz (95.1 kg)  10/10/23 201 lb (91.2 kg)  09/04/23 197 lb (89.4 kg)    BP Readings from Last 3 Encounters:  10/30/23 114/82  10/10/23 100/62  09/04/23 90/62   Wt Readings from Last 3 Encounters:   10/30/23 209 lb 9.6 oz (95.1 kg)  10/10/23 201 lb (91.2 kg)  09/04/23 197 lb (89.4 kg)    Past Medical History:  Diagnosis Date   Anxiety    Chest pain    Depression    Diabetes mellitus without complication ([name])    Fatty liver    GERD (gastroesophageal reflux disease)    Hyperlipidemia    Hypertension    Sleep apnea    has CPAP/does not use    Past Surgical History:  Procedure Laterality Date   COLONOSCOPY     Dr. Obie  2008    Current Medications: No outpatient medications have been marked as taking for the 10/30/23 encounter (Office Visit) with Nanas Lonni LITTIE, MD.     Allergies:   Patient has no known allergies.   Social History   Socioeconomic History   Marital status: Married    Spouse name: Torrence Hammack   Number of children: Not on file   Years of education: Not on file   Highest education level: Not on file  Occupational History   Occupation: Education officer, environmental    Comment: [employer]  Tobacco Use   Smoking status: Never   Smokeless tobacco: Never  Vaping Use   Vaping status: Never Used  Substance and Sexual Activity   Alcohol use: No   Drug use: No   Sexual activity: Yes  Other Topics Concern   Not on file  Social History Narrative  Not on file   Social Drivers of Health   Financial Resource Strain: Not on file  Food Insecurity: Not on file  Transportation Needs: Not on file  Physical Activity: Not on file  Stress: Not on file  Social Connections: Not on file     Family History: The patient's family history includes Cancer in his father; Hypertension in his paternal grandmother; Stroke in his father and paternal grandfather. There is no history of Colon cancer, Colon polyps, Esophageal cancer, Rectal cancer, or Stomach cancer.  ROS:   Please see the history of present illness.    All other systems reviewed and are negative.  EKGs/Labs/Other Studies Reviewed:    The following studies were reviewed today:  EKG:    10/30/2023: Normal sinus rhythm, rate 70, left bundle branch block 02/01/2023: Normal sinus rhythm, left bundle branch block, rate 80 04/13/2022: Normal sinus rhythm, rate 79, left bundle branch block 10/12/2021: Normal sinus rhythm, left bundle branch block, rate 76 04/16/21: LBBB, NSR, rate 85 07/14/2020: normal sinus rhythm, rate 86, left bundle branch block  TTE 05/03/19:  1. Left ventricular ejection fraction, by visual estimation, is 40 to 45%. The left ventricle has mild to moderately decreased function. There is mildly increased left ventricular hypertrophy. There is marked septal-lateral dyssynchrony consistent with LBBB.  2. Left ventricular diastolic parameters are consistent with Grade I diastolic dysfunction (impaired relaxation).  3. Global right ventricle has normal systolic function.The right ventricular size is normal. No increase in right ventricular wall thickness.  4. Left atrial size was normal.  5. Right atrial size was normal.  6. The mitral valve is normal in structure. No evidence of mitral valve regurgitation. No evidence of mitral stenosis.  7. The tricuspid valve is normal in structure.  8. The aortic valve is tricuspid. Aortic valve regurgitation is not visualized. Mild aortic valve sclerosis without stenosis  9. TR signal is inadequate for assessing pulmonary artery systolic pressure. 10. The inferior vena cava is normal in size with greater than 50% respiratory variability, suggesting right atrial pressure of 3 mmHg.  Coronary CTA 06/06/19: 1. Coronary calcium  score of 111. This was 67 percentile for age and sex matched control.   2. Normal coronary origin with right dominance.   3. Nonobstructive CAD. There is calcified plaque in the RPDA causing mild (25-49%) stenosis and calcified plaque in the proximal LAD and proximal RCA causing minimal (0-24%) stenosis   CAD-RADS 2. Mild non-obstructive CAD (25-49%). Consider non-atherosclerotic causes of chest pain.  Consider preventive therapy and risk factor modification.1. Coronary calcium  score of 111. This was 24 percentile for age and sex matched control.   2. Normal coronary origin with right dominance.   3. Nonobstructive CAD. There is calcified plaque in the RPDA causing mild (25-49%) stenosis and calcified plaque in the proximal LAD and proximal RCA causing minimal (0-24%) stenosis   CAD-RADS 2. Mild non-obstructive CAD (25-49%). Consider non-atherosclerotic causes of chest pain. Consider preventive therapy and risk factor modification.  Noncardiac: IMPRESSION: No significant noncardiac findings. Incidental 6 mm densely calcified granuloma in the lingula.  CMR 07/25/19: 1. Normal LV size and thickness with markedly abnormal septal motion EF 45%. 2.  Possible LV non compaction seen best in SA/3 chamber views 3. No delayed gadolinium uptake, scar, infiltration, infarct in LV myocardium 4.  Basal RV dilatation and hypokinesis RVEF 37% 5   normal MV,AV, TV 6.  Normal Aortic root 3.0 cm 7.  Normal T2* 48 msec 8. Unable to calculate T1/ECG failure  to acquire adequate T1 map sequence pre contrast      Recent Labs: 02/01/2023: Magnesium 1.7 05/09/2023: ALT 20; BUN 20; Creatinine, Ser 0.94; Hemoglobin 14.4; Platelets 268.0; Potassium 4.6; Sodium 138; TSH 1.77  Recent Lipid Panel    Component Value Date/Time   CHOL 135 05/09/2023 1036   CHOL 106 11/03/2022 0946   TRIG 203.0 (H) 05/09/2023 1036   HDL 54.80 05/09/2023 1036   HDL 57 11/03/2022 0946   CHOLHDL 2 05/09/2023 1036   VLDL 40.6 (H) 05/09/2023 1036   LDLCALC 39 05/09/2023 1036   LDLCALC 29 11/03/2022 0946    Physical Exam:    VS:  BP 114/82   Pulse 70   Ht 5' 9 (1.753 m)   Wt 209 lb 9.6 oz (95.1 kg)   SpO2 97%   BMI 30.95 kg/m     Wt Readings from Last 3 Encounters:  10/30/23 209 lb 9.6 oz (95.1 kg)  10/10/23 201 lb (91.2 kg)  09/04/23 197 lb (89.4 kg)    GEN:   in no acute distress HEENT: Normal NECK: No  JVD CARDIAC:RRR, no murmurs, rubs, gallops RESPIRATORY:  Clear to auscultation without rales, wheezing or rhonchi  ABDOMEN: Soft, non-tender, non-distended MUSCULOSKELETAL:  No edema; No deformity  SKIN: Warm and dry NEUROLOGIC:  Alert and oriented x 3 PSYCHIATRIC:  Normal affect   ASSESSMENT:    1. Chronic combined systolic and diastolic heart failure ([name])   2. Coronary artery disease involving native coronary artery of native heart without angina pectoris   3. Essential hypertension   4. Hyperlipidemia, unspecified hyperlipidemia type   5. OSA (obstructive sleep apnea)      PLAN:    Chronic combined systolic and diastolic heart failure: EF 40 to 45% on TTE from 05/02/18, with marked septal-lateral dyssynchrony consistent with LBBB.  Coronary CTA showed nonobstructive CAD.  CMR 07/25/19 showed EF 45%, no LGE.  Echocardiogram 10/01/2021 showed EF 45 to 50%, grade 1 diastolic dysfunction, dyssynchrony due to LBBB, normal RV function, no significant valvular disease.  Echocardiogram 02/2023 showed EF 35 to 40%.  Cardiac MRI 04/2023 showed LVEF 42%, RVEF 57%, RV insertion site LGE.  Appears euvolemic.  Suspect systolic dysfunction due to LBBB -Continue losartan  25 mg daily.  Previously was on Entresto  but discontinued due to low BP. -Continue Toprol -XL 150 mg daily -Continue Jardiance  10 mg daily -Continue spironolactone  12.5 mg daily -Check BMET, magnesium  Coronary artery disease: coronary CTA on 06/06/2019 showed nonobstructive CAD (calcified plaque in the RPDA causing mild (25-49%) stenosis and calcified plaque in the proximal LAD and proximal RCA causing minimal (0-24%) stenosis.  Calcium  score 111 (62nd percentile for age/gender). -Continue atorvastatin  and Repatha   Hypertension: Continue Toprol -XL, losartan , spironolactone  as above.  Appears controlled  Hyperlipidemia: LDL 85 on 04/16/2021 on atorvastatin  80 mg daily and Zetia  10 mg daily.  Referred to pharmacy lipid clinic and started  on Repatha .  LDL 29 on 11/03/2022, discontinued Zetia .  LDL 39 on 05/09/2023  Type 2 diabetes: [name] 6.1% on 04/22/21.  On metformin , mounjaro , Jardiance .  [name] up to 7.7% on 05/09/2023  OSA: Sleep study on 05/15/2019 confirmed OSA.  Reports he has not been using his BiPAP, encourage compliance  Obesity: [facility] at [facility].  On mounjaro . Lost 30 lbs but has gained about 10 lbs back.  RTC in 6 months  Medication Adjustments/Labs and Tests Ordered: Current medicines are reviewed at length with the patient today.  Concerns regarding medicines are outlined above.  Orders  Placed This Encounter  Procedures   Basic Metabolic Panel (BMET)   Magnesium   EKG 12-Lead    No orders of the defined types were placed in this encounter.    Patient Instructions  Medication Instructions:  Continue current medicatrions *If you need a refill on your cardiac medications before your next appointment, please call your pharmacy*  Lab Work: Bmet, MG today If you have labs (blood work) drawn today and your tests are completely normal, you will receive your results only by: MyChart Message (if you have MyChart) OR A paper copy in the mail If you have any lab test that is abnormal or we need to change your treatment, we will call you to review the results.  Testing/Procedures: none  Follow-Up: At [facility], you and your health needs are our priority.  As part of our continuing mission to provide you with exceptional heart care, our providers are all part of one team.  This team includes your primary Cardiologist (physician) and Advanced Practice Providers or APPs (Physician Assistants and Nurse Practitioners) who all work together to provide you with the care you need, when you need it.  Your next appointment:   6 month(s)  Provider:   Lonni LITTIE Nanas, MD    We recommend signing up for the patient portal called MyChart.  Sign up information is provided on this  After Visit Summary.  MyChart is used to connect with patients for Virtual Visits (Telemedicine).  Patients are able to view lab/test results, encounter notes, upcoming appointments, etc.  Non-urgent messages can be sent to your provider as well.   To learn more about what you can do with MyChart, go to [email or web address].   Other Instructions none         Signed, Lonni LITTIE Nanas, MD  10/30/2023 8:30 AM    [facility]

## 2023-10-30 NOTE — Patient Instructions (Signed)
 Medication Instructions:  Continue current medicatrions *If you need a refill on your cardiac medications before your next appointment, please call your pharmacy*  Lab Work: Bmet, MG today If you have labs (blood work) drawn today and your tests are completely normal, you will receive your results only by: MyChart Message (if you have MyChart) OR A paper copy in the mail If you have any lab test that is abnormal or we need to change your treatment, we will call you to review the results.  Testing/Procedures: none  Follow-Up: At [facility], you and your health needs are our priority.  As part of our continuing mission to provide you with exceptional heart care, our providers are all part of one team.  This team includes your primary Cardiologist (physician) and Advanced Practice Providers or APPs (Physician Assistants and Nurse Practitioners) who all work together to provide you with the care you need, when you need it.  Your next appointment:   6 month(s)  Provider:   Lonni LITTIE Nanas, MD    We recommend signing up for the patient portal called MyChart.  Sign up information is provided on this After Visit Summary.  MyChart is used to connect with patients for Virtual Visits (Telemedicine).  Patients are able to view lab/test results, encounter notes, upcoming appointments, etc.  Non-urgent messages can be sent to your provider as well.   To learn more about what you can do with MyChart, go to [email or web address].   Other Instructions none

## 2023-10-31 ENCOUNTER — Ambulatory Visit: Payer: Self-pay | Admitting: Cardiology

## 2023-10-31 LAB — MAGNESIUM: Magnesium: 1.9 mg/dL (ref 1.6–2.3)

## 2023-11-06 ENCOUNTER — Ambulatory Visit (INDEPENDENT_AMBULATORY_CARE_PROVIDER_SITE_OTHER): Payer: 59 | Admitting: Family Medicine

## 2023-11-06 ENCOUNTER — Ambulatory Visit: Payer: Self-pay | Admitting: Family Medicine

## 2023-11-06 ENCOUNTER — Encounter: Payer: Self-pay | Admitting: Family Medicine

## 2023-11-06 ENCOUNTER — Other Ambulatory Visit (HOSPITAL_BASED_OUTPATIENT_CLINIC_OR_DEPARTMENT_OTHER): Payer: Self-pay

## 2023-11-06 VITALS — BP 103/68 | HR 71 | Temp 97.2°F | Ht 69.0 in | Wt 207.0 lb

## 2023-11-06 DIAGNOSIS — E1165 Type 2 diabetes mellitus with hyperglycemia: Secondary | ICD-10-CM

## 2023-11-06 DIAGNOSIS — Z7984 Long term (current) use of oral hypoglycemic drugs: Secondary | ICD-10-CM | POA: Diagnosis not present

## 2023-11-06 DIAGNOSIS — E1159 Type 2 diabetes mellitus with other circulatory complications: Secondary | ICD-10-CM | POA: Diagnosis not present

## 2023-11-06 DIAGNOSIS — I152 Hypertension secondary to endocrine disorders: Secondary | ICD-10-CM

## 2023-11-06 DIAGNOSIS — Z1211 Encounter for screening for malignant neoplasm of colon: Secondary | ICD-10-CM | POA: Diagnosis not present

## 2023-11-06 LAB — MICROALBUMIN / CREATININE URINE RATIO
Creatinine,U: 54.1 mg/dL
Microalb Creat Ratio: UNDETERMINED mg/g (ref 0.0–30.0)
Microalb, Ur: <0.7 mg/dL

## 2023-11-06 LAB — POCT GLYCOSYLATED HEMOGLOBIN (HGB A1C): Hemoglobin A1C: 6.7 % — AB (ref 4.0–5.6)

## 2023-11-06 NOTE — Assessment & Plan Note (Signed)
 Blood pressure at goal today on losartan 12.5 mg daily, spironolactone 12.5 mg daily, and metoprolol succinate 150 mg daily.

## 2023-11-06 NOTE — Progress Notes (Signed)
   Travis Owens is a 68 y.o. male who presents today for an office visit.  Assessment/Plan:  Chronic Problems Addressed Today: Type 2 diabetes mellitus with hyperglycemia, without long-term current use of insulin  ([name]) [name] well controlled at 6.7.  Continue patient on [name] control.  He is currently on Mounjaro  12.5 mg weekly, metformin  1000 mg twice daily, and Jardiance  10 mg daily.  Tolerating as well.  Recheck [name] in 6 months.  We discussed lifestyle modifications.  Hypertension associated with diabetes ([name]) Blood pressure at goal today on losartan  12.5 mg daily, spironolactone  12.5 mg daily, and metoprolol  succinate 150 mg daily.    Subjective:  HPI:  See Assessment / plan for status of chronic conditions. HE is here today for follow up. He is doing well today.        Objective:  Physical Exam: BP 103/68   Pulse 71   Temp (!) 97.2 F (36.2 C) (Temporal)   Ht 5' 9 (1.753 m)   Wt 207 lb (93.9 kg)   SpO2 98%   BMI 30.57 kg/m   Wt Readings from Last 3 Encounters:  11/06/23 207 lb (93.9 kg)  10/30/23 209 lb 9.6 oz (95.1 kg)  10/10/23 201 lb (91.2 kg)  Gen: No acute distress, resting comfortably CV: Regular rate and rhythm with no murmurs appreciated Pulm: Normal work of breathing, clear to auscultation bilaterally with no crackles, wheezes, or rhonchi Neuro: Grossly normal, moves all extremities Psych: Normal affect and thought content      Travis Owens M. Kennyth, MD 11/06/2023 9:58 AM

## 2023-11-06 NOTE — Progress Notes (Signed)
 Good news! His urine sample is normal. We can recheck in a year.

## 2023-11-06 NOTE — Assessment & Plan Note (Signed)
 [name] well controlled at 6.7.  Continue patient on [name] control.  He is currently on Mounjaro  12.5 mg weekly, metformin  1000 mg twice daily, and Jardiance  10 mg daily.  Tolerating as well.  Recheck [name] in 6 months.  We discussed lifestyle modifications.

## 2023-11-06 NOTE — Patient Instructions (Addendum)
 It was very nice to see you today!  Your [name] today is 6.7. Keep up the great work! I will see you back in 6 months for your annual physical. Please come back to see me sooner if needed.   Return in about 6 months (around 05/08/2024) for Annual Physical.   Take care, Dr Kennyth  PLEASE NOTE:  If you had any lab tests, please let us  know if you have not heard back within a few days. You may see your results on mychart before we have a chance to review them but we will give you a call once they are reviewed by us .   If we ordered any referrals today, please let us  know if you have not heard from their office within the next week.   If you had any urgent prescriptions sent in today, please check with the pharmacy within an hour of our visit to make sure the prescription was transmitted appropriately.   Please try these tips to maintain a healthy lifestyle:  Eat at least 3 REAL meals and 1-2 snacks per day.  Aim for no more than 5 hours between eating.  If you eat breakfast, please do so within one hour of getting up.   Each meal should contain half fruits/vegetables, one quarter protein, and one quarter carbs (no bigger than a computer mouse)  Cut down on sweet beverages. This includes juice, soda, and sweet tea.   Drink at least 1 glass of water with each meal and aim for at least 8 glasses per day  Exercise at least 150 minutes every week.

## 2023-11-21 ENCOUNTER — Ambulatory Visit (INDEPENDENT_AMBULATORY_CARE_PROVIDER_SITE_OTHER): Admitting: Family Medicine

## 2023-11-21 ENCOUNTER — Encounter (INDEPENDENT_AMBULATORY_CARE_PROVIDER_SITE_OTHER): Payer: Self-pay | Admitting: Family Medicine

## 2023-11-21 ENCOUNTER — Other Ambulatory Visit (HOSPITAL_BASED_OUTPATIENT_CLINIC_OR_DEPARTMENT_OTHER): Payer: Self-pay

## 2023-11-21 VITALS — BP 103/66 | HR 81 | Temp 97.5°F | Ht 68.5 in | Wt 200.0 lb

## 2023-11-21 DIAGNOSIS — Z7985 Long-term (current) use of injectable non-insulin antidiabetic drugs: Secondary | ICD-10-CM

## 2023-11-21 DIAGNOSIS — E559 Vitamin D deficiency, unspecified: Secondary | ICD-10-CM | POA: Diagnosis not present

## 2023-11-21 DIAGNOSIS — E669 Obesity, unspecified: Secondary | ICD-10-CM

## 2023-11-21 DIAGNOSIS — Z6829 Body mass index (BMI) 29.0-29.9, adult: Secondary | ICD-10-CM | POA: Diagnosis not present

## 2023-11-21 DIAGNOSIS — E1165 Type 2 diabetes mellitus with hyperglycemia: Secondary | ICD-10-CM

## 2023-11-21 DIAGNOSIS — Z7984 Long term (current) use of oral hypoglycemic drugs: Secondary | ICD-10-CM

## 2023-11-21 DIAGNOSIS — E119 Type 2 diabetes mellitus without complications: Secondary | ICD-10-CM | POA: Diagnosis not present

## 2023-11-21 MED ORDER — TIRZEPATIDE 12.5 MG/0.5ML ~~LOC~~ SOAJ
12.5000 mg | SUBCUTANEOUS | 1 refills | Status: DC
  Filled 2023-11-21: qty 6, 84d supply, fill #0

## 2023-11-21 NOTE — Progress Notes (Signed)
 Office: [phone]  /  Fax: [phone]  WEIGHT SUMMARY AND BIOMETRICS  Anthropometric Measurements Height: 5' 8.5 (1.74 m) Weight: 200 lb (90.7 kg) BMI (Calculated): 29.96 Weight at Last Visit: 201 lb Weight Lost Since Last Visit: 1 lb Weight Gained Since Last Visit: 0 Starting Weight: 220 lb Total Weight Loss (lbs): 20 lb (9.072 kg) Peak Weight: 240 lb   Body Composition  Body Fat %: 30.8 % Fat Mass (lbs): 61.6 lbs Muscle Mass (lbs): 131.6 lbs Total Body Water (lbs): 95.6 lbs Visceral Fat Rating : 17   Other Clinical Data Fasting: no Labs: no Today's Visit #: 72 Starting Date: 04/30/19    Chief Complaint: OBESITY    History of Present Illness Travis Owens is a 68 year old male with obesity and type 2 diabetes who presents for obesity treatment and diabetes management.  He has been following a category four eating plan about fifty percent of the time and engaging in significant physical activity, including yard work for multiple hours per day, a couple of times a week. He has lost one pound in the last six weeks since his last visit.  He is managing type 2 diabetes with metformin , Jardiance , and Mounjaro . His hemoglobin [name] has improved from 7.7% in January to 6.7% this month. His urine microalbumin creatinine ratio was below thirty. He experiences a decrease in cravings for sweets and attributes some of his success to Mounjaro , which he feels may have helped with portion control.  He experiences muscle cramping, which he attributes to working close to eight hours in the heat. He drinks Gatorade to replenish electrolytes lost through sweating. He reports good hydration practices.  His sleep has been good, and he feels that the physical activity contributes to this. He is involved in moving and packing activities, which provide additional exercise. He has a history of vitamin D  deficiency and takes over-the-counter vitamin D2, 2000 IU daily.      PHYSICAL  EXAM:  Blood pressure 103/66, pulse 81, temperature (!) 97.5 F (36.4 C), height 5' 8.5 (1.74 m), weight 200 lb (90.7 kg), SpO2 98%. Body mass index is 29.97 kg/m.  DIAGNOSTIC DATA REVIEWED:  BMET    Component Value Date/Time   NA 143 10/30/2023 0857   K 4.9 10/30/2023 0857   CL 103 10/30/2023 0857   CO2 22 10/30/2023 0857   GLUCOSE 127 (H) 10/30/2023 0857   GLUCOSE 104 (H) 05/09/2023 1036   BUN 16 10/30/2023 0857   CREATININE 0.79 10/30/2023 0857   CALCIUM  9.7 10/30/2023 0857   GFRNONAA 106 04/30/2019 1145   GFRAA 122 04/30/2019 1145   Lab Results  Component Value Date   HGBA1C 6.7 (A) 11/06/2023   HGBA1C 9.4 12/05/2016   Lab Results  Component Value Date   INSULIN  23.9 11/03/2022   INSULIN  24.2 04/30/2019   Lab Results  Component Value Date   TSH 1.77 05/09/2023   CBC    Component Value Date/Time   WBC 8.3 05/09/2023 1036   RBC 4.79 05/09/2023 1036   HGB 14.4 05/09/2023 1036   HGB 13.3 03/14/2023 0954   HCT 44.6 05/09/2023 1036   HCT 40.2 03/14/2023 0954   PLT 268.0 05/09/2023 1036   PLT 268 03/14/2023 0954   MCV 93.2 05/09/2023 1036   MCV 93 03/14/2023 0954   MCH 30.8 03/14/2023 0954   MCHC 32.2 05/09/2023 1036   RDW 14.5 05/09/2023 1036   RDW 13.4 03/14/2023 0954   Iron Studies No results found for: IRON,  TIBC, FERRITIN, IRONPCTSAT Lipid Panel     Component Value Date/Time   CHOL 135 05/09/2023 1036   CHOL 106 11/03/2022 0946   TRIG 203.0 (H) 05/09/2023 1036   HDL 54.80 05/09/2023 1036   HDL 57 11/03/2022 0946   CHOLHDL 2 05/09/2023 1036   VLDL 40.6 (H) 05/09/2023 1036   LDLCALC 39 05/09/2023 1036   LDLCALC 29 11/03/2022 0946   Hepatic Function Panel     Component Value Date/Time   PROT 7.0 05/09/2023 1036   PROT 6.8 11/03/2022 0946   ALBUMIN 4.6 05/09/2023 1036   ALBUMIN 4.6 11/03/2022 0946   AST 20 05/09/2023 1036   ALT 20 05/09/2023 1036   ALKPHOS 54 05/09/2023 1036   BILITOT 0.5 05/09/2023 1036   BILITOT 0.4 11/03/2022  0946      Component Value Date/Time   TSH 1.77 05/09/2023 1036   Nutritional Lab Results  Component Value Date   VD25OH 35.69 05/09/2023   VD25OH 42.9 11/03/2022   VD25OH 37.52 05/06/2022     Assessment and Plan Assessment & Plan Type 2 diabetes mellitus Significant improvement in glycemic control with hemoglobin [name] reduced from 7.7% to 6.7%. Urine microalbumin creatinine ratio below 30, indicating good kidney function. Reduced cravings for sweets reported. - Continue metformin , Jardiance , and Mounjaro  - Maintain current diet and exercise regimen  Obesity Obesity management with 50% adherence to category four eating plan. Engaging in significant physical activity through yard work, resulting in 1-pound weight loss over 6 weeks. - Continue category four eating plan  Vitamin D  deficiency Managed with over-the-counter vitamin D2 supplementation. - Continue vitamin D2 2000 IU daily - Monitor vitamin D  levels as needed      He was informed of the importance of frequent follow up visits to maximize his success with intensive lifestyle modifications for his multiple health conditions.    [name], MD

## 2023-12-19 ENCOUNTER — Encounter (INDEPENDENT_AMBULATORY_CARE_PROVIDER_SITE_OTHER): Payer: Self-pay | Admitting: Family Medicine

## 2023-12-19 ENCOUNTER — Ambulatory Visit (INDEPENDENT_AMBULATORY_CARE_PROVIDER_SITE_OTHER): Admitting: Family Medicine

## 2023-12-19 VITALS — BP 106/69 | HR 84 | Temp 97.6°F | Ht 68.5 in | Wt 201.0 lb

## 2023-12-19 DIAGNOSIS — E119 Type 2 diabetes mellitus without complications: Secondary | ICD-10-CM | POA: Diagnosis not present

## 2023-12-19 DIAGNOSIS — Z7984 Long term (current) use of oral hypoglycemic drugs: Secondary | ICD-10-CM | POA: Diagnosis not present

## 2023-12-19 DIAGNOSIS — Z6829 Body mass index (BMI) 29.0-29.9, adult: Secondary | ICD-10-CM

## 2023-12-19 DIAGNOSIS — Z6830 Body mass index (BMI) 30.0-30.9, adult: Secondary | ICD-10-CM | POA: Diagnosis not present

## 2023-12-19 DIAGNOSIS — E66811 Obesity, class 1: Secondary | ICD-10-CM

## 2023-12-19 DIAGNOSIS — E559 Vitamin D deficiency, unspecified: Secondary | ICD-10-CM | POA: Diagnosis not present

## 2023-12-19 DIAGNOSIS — Z7985 Long-term (current) use of injectable non-insulin antidiabetic drugs: Secondary | ICD-10-CM

## 2023-12-19 DIAGNOSIS — E669 Obesity, unspecified: Secondary | ICD-10-CM

## 2023-12-19 DIAGNOSIS — E1165 Type 2 diabetes mellitus with hyperglycemia: Secondary | ICD-10-CM

## 2023-12-19 NOTE — Progress Notes (Signed)
 Office: [phone]  /  Fax: [phone]  WEIGHT SUMMARY AND BIOMETRICS  Anthropometric Measurements Height: 5' 8.5 (1.74 m) Weight: 201 lb (91.2 kg) BMI (Calculated): 30.11 Weight at Last Visit: 200 lb Weight Lost Since Last Visit: 0 Weight Gained Since Last Visit: 1 lb Starting Weight: 220 lb Total Weight Loss (lbs): 19 lb (8.618 kg) Peak Weight: 240 lb   Body Composition  Body Fat %: 31.9 % Fat Mass (lbs): 64.2 lbs Muscle Mass (lbs): 130.2 lbs Total Body Water (lbs): 98.8 lbs Visceral Fat Rating : 18   Other Clinical Data Fasting: no Labs: no Today's Visit #: 62 Starting Date: 04/30/19    Chief Complaint: OBESITY   History of Present Illness Travis Owens is a 68 year old male with obesity and type 2 diabetes who presents for obesity treatment and progress assessment.  He is following a category four eating plan, adhering to it about thirty percent of the time. He is working on meeting protein goals and increasing his intake of fruits and vegetables. He sometimes skips meals and does not hydrate adequately. He aims to get seventy nine hours of sleep per night.  He has gained one pound in the last month since his last session. He engages in physical activity by doing yard work for three to four hours, two days a week. He recently celebrated his twenty-fourth wedding anniversary and attended a friend's birthday, which involved some celebration eating.  In addition to obesity, he is managing type 2 diabetes with Jardiance , metformin , and Mounjaro . He is focusing on diet, exercise, and weight loss to improve and control his blood sugars.  He has been taking vitamin D  supplements and recently reordered vitamin D3 at a dose of 1000 IU. He reports drinking Gatorade Zero, especially when sweating during yard work.  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      PHYSICAL EXAM:  Blood pressure 106/69, pulse 84, temperature 97.6 F (36.4 C), height 5' 8.5 (1.74 m), weight 201 lb (91.2 kg), SpO2 97%. Body mass index is 30.12 kg/m.  DIAGNOSTIC DATA REVIEWED:  BMET    Component Value Date/Time   NA 143 10/30/2023 0857   K 4.9 10/30/2023 0857   CL 103 10/30/2023 0857   CO2 22 10/30/2023 0857   GLUCOSE 127 (H) 10/30/2023 0857   GLUCOSE 104 (H) 05/09/2023 1036   BUN 16 10/30/2023 0857   CREATININE 0.79 10/30/2023 0857   CALCIUM  9.7 10/30/2023 0857   GFRNONAA 106 04/30/2019 1145   GFRAA 122 04/30/2019 1145   Lab Results  Component Value Date   HGBA1C 6.7 (A) 11/06/2023   HGBA1C 9.4 12/05/2016   Lab Results  Component Value Date   INSULIN  23.9 11/03/2022   INSULIN  24.2 04/30/2019   Lab Results  Component Value Date   TSH 1.77 05/09/2023   CBC    Component Value Date/Time   WBC 8.3 05/09/2023 1036   RBC 4.79 05/09/2023 1036   HGB 14.4 05/09/2023 1036   HGB 13.3 03/14/2023 0954   HCT 44.6 05/09/2023 1036   HCT 40.2 03/14/2023 0954   PLT 268.0 05/09/2023 1036   PLT 268 03/14/2023 0954   MCV 93.2 05/09/2023 1036   MCV 93 03/14/2023 0954   MCH 30.8 03/14/2023 0954   MCHC 32.2 05/09/2023 1036   RDW 14.5 05/09/2023 1036   RDW 13.4 03/14/2023 0954  Iron Studies No results found for: IRON, TIBC, FERRITIN, IRONPCTSAT Lipid Panel     Component Value Date/Time   CHOL 135 05/09/2023 1036   CHOL 106 11/03/2022 0946   TRIG 203.0 (H) 05/09/2023 1036   HDL 54.80 05/09/2023 1036   HDL 57 11/03/2022 0946   CHOLHDL 2 05/09/2023 1036   VLDL 40.6 (H) 05/09/2023 1036   LDLCALC 39 05/09/2023 1036   LDLCALC 29 11/03/2022 0946   Hepatic Function Panel     Component Value Date/Time   PROT 7.0 05/09/2023 1036   PROT 6.8 11/03/2022 0946   ALBUMIN 4.6 05/09/2023 1036   ALBUMIN 4.6 11/03/2022 0946   AST 20 05/09/2023 1036   ALT 20 05/09/2023 1036   ALKPHOS 54 05/09/2023 1036   BILITOT 0.5  05/09/2023 1036   BILITOT 0.4 11/03/2022 0946      Component Value Date/Time   TSH 1.77 05/09/2023 1036   Nutritional Lab Results  Component Value Date   VD25OH 35.69 05/09/2023   VD25OH 42.9 11/03/2022   VD25OH 37.52 05/06/2022     Assessment and Plan Assessment & Plan Obesity Obesity management is ongoing. He follows the category four eating plan about 30% of the time, is working on meeting protein goals, and increasing fruit and vegetable intake. He exercises by doing yard work for 3-4 hours, two days a week. He gained one pound in the last month, possibly due to celebration eating, and is skipping meals and not hydrating adequately. 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 Blood sugar control is a focus of the treatment plan. - Continue Jardiance , metformin , and Mounjaro  - Encourage adherence to diet and exercise regimen to aid in blood sugar control  Vitamin D  deficiency Vitamin D  deficiency is managed with supplementation. He mistakenly ordered 1000 IU instead of the recommended 2000 IU. His last vitamin D  level was checked at the beginning of the year, and he is due for a recheck. - Advise taking two 1000 IU vitamin D  pills to meet the 2000 IU daily requirement - Order vitamin D  level test to ensure levels are within the appropriate range  Follow-Up Follow-up appointments are scheduled to monitor progress and adjust treatment as necessary. - Schedule follow-up appointment for September 23rd at 12:40 PM - Schedule additional  follow-up appointment for October 21st at 12:00 PM - Send MyChart notification for appointment reminders   He was informed of the importance of frequent follow up visits to maximize his success with intensive lifestyle modifications for his multiple health conditions.    [name], MD

## 2023-12-20 LAB — VITAMIN D 25 HYDROXY (VIT D DEFICIENCY, FRACTURES): Vit D, 25-Hydroxy: 46.3 ng/mL (ref 30.0–100.0)

## 2023-12-26 ENCOUNTER — Other Ambulatory Visit: Payer: Self-pay | Admitting: Cardiology

## 2023-12-26 ENCOUNTER — Other Ambulatory Visit (HOSPITAL_BASED_OUTPATIENT_CLINIC_OR_DEPARTMENT_OTHER): Payer: Self-pay

## 2023-12-26 DIAGNOSIS — I2510 Atherosclerotic heart disease of native coronary artery without angina pectoris: Secondary | ICD-10-CM

## 2023-12-26 DIAGNOSIS — E1169 Type 2 diabetes mellitus with other specified complication: Secondary | ICD-10-CM

## 2023-12-26 MED ORDER — REPATHA SURECLICK 140 MG/ML ~~LOC~~ SOAJ
1.0000 mL | SUBCUTANEOUS | 3 refills | Status: AC
  Filled 2023-12-26: qty 6, 84d supply, fill #0
  Filled 2024-03-13: qty 6, 84d supply, fill #1

## 2023-12-26 NOTE — Telephone Encounter (Signed)
 Refill Request.

## 2024-01-01 ENCOUNTER — Other Ambulatory Visit (HOSPITAL_BASED_OUTPATIENT_CLINIC_OR_DEPARTMENT_OTHER): Payer: Self-pay

## 2024-01-01 ENCOUNTER — Other Ambulatory Visit: Payer: Self-pay | Admitting: Cardiology

## 2024-01-02 ENCOUNTER — Other Ambulatory Visit (HOSPITAL_BASED_OUTPATIENT_CLINIC_OR_DEPARTMENT_OTHER): Payer: Self-pay

## 2024-01-02 MED ORDER — SPIRONOLACTONE 25 MG PO TABS
12.5000 mg | ORAL_TABLET | Freq: Every day | ORAL | 3 refills | Status: AC
  Filled 2024-01-02: qty 45, 90d supply, fill #0
  Filled 2024-04-19: qty 45, 90d supply, fill #1

## 2024-01-16 ENCOUNTER — Ambulatory Visit (INDEPENDENT_AMBULATORY_CARE_PROVIDER_SITE_OTHER): Admitting: Family Medicine

## 2024-01-16 ENCOUNTER — Other Ambulatory Visit (HOSPITAL_BASED_OUTPATIENT_CLINIC_OR_DEPARTMENT_OTHER): Payer: Self-pay

## 2024-01-16 ENCOUNTER — Encounter (INDEPENDENT_AMBULATORY_CARE_PROVIDER_SITE_OTHER): Payer: Self-pay | Admitting: Family Medicine

## 2024-01-16 VITALS — BP 106/71 | HR 83 | Temp 97.4°F | Ht 68.5 in | Wt 198.0 lb

## 2024-01-16 DIAGNOSIS — E1159 Type 2 diabetes mellitus with other circulatory complications: Secondary | ICD-10-CM

## 2024-01-16 DIAGNOSIS — E119 Type 2 diabetes mellitus without complications: Secondary | ICD-10-CM

## 2024-01-16 DIAGNOSIS — I10 Essential (primary) hypertension: Secondary | ICD-10-CM | POA: Diagnosis not present

## 2024-01-16 DIAGNOSIS — E1165 Type 2 diabetes mellitus with hyperglycemia: Secondary | ICD-10-CM

## 2024-01-16 DIAGNOSIS — E669 Obesity, unspecified: Secondary | ICD-10-CM

## 2024-01-16 DIAGNOSIS — Z7984 Long term (current) use of oral hypoglycemic drugs: Secondary | ICD-10-CM | POA: Diagnosis not present

## 2024-01-16 DIAGNOSIS — Z7985 Long-term (current) use of injectable non-insulin antidiabetic drugs: Secondary | ICD-10-CM | POA: Diagnosis not present

## 2024-01-16 DIAGNOSIS — Z6829 Body mass index (BMI) 29.0-29.9, adult: Secondary | ICD-10-CM | POA: Diagnosis not present

## 2024-01-16 MED ORDER — TIRZEPATIDE 12.5 MG/0.5ML ~~LOC~~ SOAJ
12.5000 mg | SUBCUTANEOUS | 0 refills | Status: DC
  Filled 2024-01-16: qty 6, 84d supply, fill #0

## 2024-01-16 NOTE — Addendum Note (Signed)
 Addended by: LAFE BAKER CROME on: 01/16/2024 02:25 PM   Modules accepted: Level of Service

## 2024-01-16 NOTE — Progress Notes (Signed)
 Office: [phone]  /  Fax: [phone]  WEIGHT SUMMARY AND BIOMETRICS  Anthropometric Measurements Height: 5' 8.5 (1.74 m) Weight: 198 lb (89.8 kg) BMI (Calculated): 29.66 Weight at Last Visit: 201 lb Weight Lost Since Last Visit: 3 lb Weight Gained Since Last Visit: 0 Starting Weight: 220 lb Total Weight Loss (lbs): 22 lb (9.979 kg) Peak Weight: 240 lb   Body Composition  Body Fat %: 30.8 % Fat Mass (lbs): 61.2 lbs Muscle Mass (lbs): 130.6 lbs Total Body Water (lbs): 96.2 lbs Visceral Fat Rating : 17   Other Clinical Data Fasting: no Labs: no Today's Visit #: 16 Starting Date: 04/30/19    Chief Complaint: OBESITY   History of Present Illness Travis Owens is a 68 year old male with obesity who presents for obesity treatment and progress assessment.  He has been following a category four eating plan about fifty percent of the time and engages in yard work for about two hours, three times a week as exercise. He has lost three pounds since his last visit.  He is currently taking metoprolol  100 mg, one and a half pills daily, and losartan  25 mg, half a pill daily. No symptoms of hypotension such as lightheadedness or dizziness.  He has type 2 diabetes and is on Mounjaro  12.5 mg, Jardiance , and metformin . He is working on decreasing simple carbohydrates in his diet to control glucose levels. He requests a refill of his Mounjaro  today.  He recently had COVID-19, which he contracted from [place]. He both managed the illness without severe complications such as pneumonia or hospitalization. He did not lose his sense of taste or smell but experienced significant nasal congestion, describing it as 'like a faucet.' He did not monitor his blood sugars during the illness and reports not having a significant sore throat.  He mentions not hydrating as much as he should and sometimes drinks diet drinks and zero-calorie beverages. He also consumed a juice blend made by [employer]  containing orange, pineapple, and apple juice while recovering from COVID-19.      PHYSICAL EXAM:  Blood pressure 106/71, pulse 83, temperature (!) 97.4 F (36.3 C), height 5' 8.5 (1.74 m), weight 198 lb (89.8 kg), SpO2 97%. Body mass index is 29.67 kg/m.  DIAGNOSTIC DATA REVIEWED:  BMET    Component Value Date/Time   NA 143 10/30/2023 0857   K 4.9 10/30/2023 0857   CL 103 10/30/2023 0857   CO2 22 10/30/2023 0857   GLUCOSE 127 (H) 10/30/2023 0857   GLUCOSE 104 (H) 05/09/2023 1036   BUN 16 10/30/2023 0857   CREATININE 0.79 10/30/2023 0857   CALCIUM  9.7 10/30/2023 0857   GFRNONAA 106 04/30/2019 1145   GFRAA 122 04/30/2019 1145   Lab Results  Component Value Date   HGBA1C 6.7 (A) 11/06/2023   HGBA1C 9.4 12/05/2016   Lab Results  Component Value Date   INSULIN  23.9 11/03/2022   INSULIN  24.2 04/30/2019   Lab Results  Component Value Date   TSH 1.77 05/09/2023   CBC    Component Value Date/Time   WBC 8.3 05/09/2023 1036   RBC 4.79 05/09/2023 1036   HGB 14.4 05/09/2023 1036   HGB 13.3 03/14/2023 0954   HCT 44.6 05/09/2023 1036   HCT 40.2 03/14/2023 0954   PLT 268.0 05/09/2023 1036   PLT 268 03/14/2023 0954   MCV 93.2 05/09/2023 1036   MCV 93 03/14/2023 0954   MCH 30.8 03/14/2023 0954   MCHC 32.2 05/09/2023 1036  RDW 14.5 05/09/2023 1036   RDW 13.4 03/14/2023 0954   Iron Studies No results found for: IRON, TIBC, FERRITIN, IRONPCTSAT Lipid Panel     Component Value Date/Time   CHOL 135 05/09/2023 1036   CHOL 106 11/03/2022 0946   TRIG 203.0 (H) 05/09/2023 1036   HDL 54.80 05/09/2023 1036   HDL 57 11/03/2022 0946   CHOLHDL 2 05/09/2023 1036   VLDL 40.6 (H) 05/09/2023 1036   LDLCALC 39 05/09/2023 1036   LDLCALC 29 11/03/2022 0946   Hepatic Function Panel     Component Value Date/Time   PROT 7.0 05/09/2023 1036   PROT 6.8 11/03/2022 0946   ALBUMIN 4.6 05/09/2023 1036   ALBUMIN 4.6 11/03/2022 0946   AST 20 05/09/2023 1036   ALT 20  05/09/2023 1036   ALKPHOS 54 05/09/2023 1036   BILITOT 0.5 05/09/2023 1036   BILITOT 0.4 11/03/2022 0946      Component Value Date/Time   TSH 1.77 05/09/2023 1036   Nutritional Lab Results  Component Value Date   VD25OH 46.3 12/19/2023   VD25OH 35.69 05/09/2023   VD25OH 42.9 11/03/2022     Assessment and Plan Assessment & Plan Obesity Obesity management with a category four eating plan, adhered to 50% of the time, and regular physical activity including yard work for two hours, three times a week. Weight loss of three pounds since the last visit. Nutritional goals discussed with emphasis on meals containing 500-600 calories and 40+ grams of protein. - Continue category four eating plan. - Engage in regular physical activity, including yard work. - Provide slow cooker recipes to meet nutritional goals. - Encourage hydration, especially after illness.  Type 2 diabetes mellitus Type 2 diabetes managed with Mounjaro  12.5 mg, Jardiance , and metformin . Efforts to decrease dietary simple carbohydrates. No gastrointestinal issues with Mounjaro . Missed a dose due to travel but resumed without issues. Discussed delaying medications like Mounjaro  and metformin  if unable to eat due to illness. - Refill Mounjaro  prescription for 90 days. - Continue Jardiance  and metformin . - Encourage dietary modifications to decrease simple carbohydrates.  Essential hypertension Essential hypertension managed with metoprolol  100 mg (1.5 pills daily) and losartan  25 mg (half a pill daily). Current blood pressure is 106/71 mmHg. No symptoms of lightheadedness or dizziness. Discussed monitoring for symptoms of low blood pressure due to weight loss and potential need for medication adjustment. - Monitor for symptoms of low blood pressure, such as lightheadedness or dizziness. - Continue current doses of metoprolol  and losartan .   TRUE was informed of the importance of frequent follow up visits to maximize his  success with intensive lifestyle modifications for his obesity and obesity related health conditions as recommended by USPSTF and CMS guidelines   [name], MD

## 2024-01-17 ENCOUNTER — Other Ambulatory Visit: Payer: Self-pay | Admitting: Family Medicine

## 2024-01-17 ENCOUNTER — Other Ambulatory Visit: Payer: Self-pay | Admitting: Cardiology

## 2024-01-17 ENCOUNTER — Other Ambulatory Visit (HOSPITAL_BASED_OUTPATIENT_CLINIC_OR_DEPARTMENT_OTHER): Payer: Self-pay

## 2024-01-17 MED ORDER — LOSARTAN POTASSIUM 25 MG PO TABS
12.5000 mg | ORAL_TABLET | Freq: Every day | ORAL | 3 refills | Status: AC
  Filled 2024-01-17: qty 45, 90d supply, fill #0
  Filled 2024-04-19: qty 45, 90d supply, fill #1

## 2024-01-17 MED ORDER — ESCITALOPRAM OXALATE 20 MG PO TABS
20.0000 mg | ORAL_TABLET | Freq: Every day | ORAL | 3 refills | Status: DC
  Filled 2024-01-17: qty 90, 90d supply, fill #0
  Filled 2024-04-19: qty 90, 90d supply, fill #1

## 2024-02-13 ENCOUNTER — Encounter (INDEPENDENT_AMBULATORY_CARE_PROVIDER_SITE_OTHER): Payer: Self-pay | Admitting: Family Medicine

## 2024-02-13 ENCOUNTER — Ambulatory Visit (INDEPENDENT_AMBULATORY_CARE_PROVIDER_SITE_OTHER): Admitting: Family Medicine

## 2024-02-13 VITALS — BP 136/77 | HR 75 | Temp 98.1°F | Ht 68.5 in | Wt 199.0 lb

## 2024-02-13 DIAGNOSIS — I152 Hypertension secondary to endocrine disorders: Secondary | ICD-10-CM

## 2024-02-13 DIAGNOSIS — Z6829 Body mass index (BMI) 29.0-29.9, adult: Secondary | ICD-10-CM | POA: Diagnosis not present

## 2024-02-13 DIAGNOSIS — Z6833 Body mass index (BMI) 33.0-33.9, adult: Secondary | ICD-10-CM

## 2024-02-13 DIAGNOSIS — E669 Obesity, unspecified: Secondary | ICD-10-CM

## 2024-02-13 DIAGNOSIS — I10 Essential (primary) hypertension: Secondary | ICD-10-CM | POA: Diagnosis not present

## 2024-02-13 NOTE — Progress Notes (Signed)
 Office: [phone]  /  Fax: [phone]  WEIGHT SUMMARY AND BIOMETRICS  Anthropometric Measurements Height: 5' 8.5 (1.74 m) Weight: 199 lb (90.3 kg) BMI (Calculated): 29.81 Weight at Last Visit: 198 lb Weight Lost Since Last Visit: 0 Weight Gained Since Last Visit: 1 lb Starting Weight: 220 lb Total Weight Loss (lbs): 21 lb (9.526 kg) Peak Weight: 240 lb   Body Composition  Body Fat %: 31.4 % Fat Mass (lbs): 62.6 lbs Muscle Mass (lbs): 129.8 lbs Total Body Water (lbs): 97.2 lbs Visceral Fat Rating : 18   Other Clinical Data Fasting: no Labs: no Today's Visit #: 41 Starting Date: 04/30/19    Chief Complaint: OBESITY    History of Present Illness Travis Owens is a 68 year old male Travis Owens who presents for obesity treatment and progress assessment.  He is following a category four eating plan, adhering to it about fifty percent of the time. He is attempting to increase his intake of fruits and vegetables but struggles Travis meeting the recommended protein intake and drinking enough water. He sometimes skips lunch or does not get around to eating it due to lack of hunger, which can lead to larger meals in the evening. He has gained one pound since his last visit a month ago.  He generally gets seven or more hours of sleep most nights of the week. 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  In addition to obesity, he is managing Owens Travis losartan  25 mg, and his blood pressure is controlled at 136/77 mmHg.  He discusses challenges Travis hydration and protein intake, noting that carrying a water bottle might help. He tries to consume meat to maintain protein levels and has found a cereal Travis a high protein content. No issues Travis hunger levels and no problems Travis low blood sugar. His stress level is described as 'good'.      PHYSICAL  EXAM:  Blood pressure 136/77, pulse 75, temperature 98.1 F (36.7 C), height 5' 8.5 (1.74 m), weight 199 lb (90.3 kg), SpO2 99%. Body mass index is 29.82 kg/m.  DIAGNOSTIC DATA REVIEWED:  BMET    Component Value Date/Time   NA 143 10/30/2023 0857   K 4.9 10/30/2023 0857   CL 103 10/30/2023 0857   CO2 22 10/30/2023 0857   GLUCOSE 127 (H) 10/30/2023 0857   GLUCOSE 104 (H) 05/09/2023 1036   BUN 16 10/30/2023 0857   CREATININE 0.79 10/30/2023 0857   CALCIUM  9.7 10/30/2023 0857   GFRNONAA 106 04/30/2019 1145   GFRAA 122 04/30/2019 1145   Lab Results  Component Value Date   HGBA1C 6.7 (A) 11/06/2023   HGBA1C 9.4 12/05/2016   Lab Results  Component Value Date   INSULIN  23.9 11/03/2022   INSULIN  24.2 04/30/2019   Lab Results  Component Value Date   TSH 1.77 05/09/2023   CBC    Component Value Date/Time   WBC 8.3 05/09/2023 1036   RBC 4.79 05/09/2023 1036   HGB 14.4 05/09/2023 1036   HGB 13.3 03/14/2023 0954   HCT 44.6 05/09/2023 1036   HCT 40.2 03/14/2023 0954   PLT 268.0 05/09/2023 1036   PLT 268 03/14/2023 0954   MCV 93.2 05/09/2023 1036   MCV 93 03/14/2023 0954   MCH 30.8 03/14/2023 0954   MCHC 32.2 05/09/2023 1036   RDW 14.5 05/09/2023 1036   RDW 13.4 03/14/2023 0954  Iron Studies No results found for: IRON, TIBC, FERRITIN, IRONPCTSAT Lipid Panel     Component Value Date/Time   CHOL 135 05/09/2023 1036   CHOL 106 11/03/2022 0946   TRIG 203.0 (H) 05/09/2023 1036   HDL 54.80 05/09/2023 1036   HDL 57 11/03/2022 0946   CHOLHDL 2 05/09/2023 1036   VLDL 40.6 (H) 05/09/2023 1036   LDLCALC 39 05/09/2023 1036   LDLCALC 29 11/03/2022 0946   Hepatic Function Panel     Component Value Date/Time   PROT 7.0 05/09/2023 1036   PROT 6.8 11/03/2022 0946   ALBUMIN 4.6 05/09/2023 1036   ALBUMIN 4.6 11/03/2022 0946   AST 20 05/09/2023 1036   ALT 20 05/09/2023 1036   ALKPHOS 54 05/09/2023 1036   BILITOT 0.5 05/09/2023 1036   BILITOT 0.4 11/03/2022  0946      Component Value Date/Time   TSH 1.77 05/09/2023 1036   Nutritional Lab Results  Component Value Date   VD25OH 46.3 12/19/2023   VD25OH 35.69 05/09/2023   VD25OH 42.9 11/03/2022     Assessment and Plan Assessment & Plan Obesity Travis a recent weight gain of one pound over the last month. Current weight management includes a category four eating plan, followed approximately 50% of the time. Challenges include inadequate protein intake and hydration. Engages in yard work as NEAT two days a week. Muscle mass has fluctuated, Travis a recent dip. Visceral fat rating is 18, above the target of 12 or below. Fat percentage is 31.4%, above the target of under 30%. - Increase protein intake to 120 grams per day to maintain muscle mass and support metabolism. - Consider journaling food intake to track protein and calorie consumption. - Maintain calorie intake between 1800 to 2000 calories per day. - Continue yard work as [employer]. - Avoid keeping tempting foods at home to prevent overeating during holidays.  Essential Owens Well-controlled Travis losartan  25 mg. Blood pressure today is 136/77 mmHg. He is working on diet, exercise, and weight loss to manage Owens. - Continue losartan  25 mg daily. - Continue lifestyle modifications including diet, exercise, and weight loss.     I personally spent a total of 32 minutes in the care of the patient today including preparing to see the patient, documenting clinical information in the EMR, and customized nutritional counseling for their specific health and social needs.    Jaimon was informed of the importance of frequent follow up visits to maximize his success Travis intensive lifestyle modifications for his obesity and obesity related health conditions as recommended by USPSTF and CMS guidelines   [name], MD

## 2024-03-04 ENCOUNTER — Other Ambulatory Visit (HOSPITAL_BASED_OUTPATIENT_CLINIC_OR_DEPARTMENT_OTHER): Payer: Self-pay

## 2024-03-08 ENCOUNTER — Other Ambulatory Visit (HOSPITAL_BASED_OUTPATIENT_CLINIC_OR_DEPARTMENT_OTHER): Payer: Self-pay

## 2024-03-12 ENCOUNTER — Other Ambulatory Visit (HOSPITAL_BASED_OUTPATIENT_CLINIC_OR_DEPARTMENT_OTHER): Payer: Self-pay

## 2024-03-12 ENCOUNTER — Encounter (INDEPENDENT_AMBULATORY_CARE_PROVIDER_SITE_OTHER): Payer: Self-pay | Admitting: Family Medicine

## 2024-03-12 ENCOUNTER — Ambulatory Visit (INDEPENDENT_AMBULATORY_CARE_PROVIDER_SITE_OTHER): Payer: Self-pay | Admitting: Family Medicine

## 2024-03-12 VITALS — BP 106/72 | HR 82 | Temp 97.4°F | Ht 68.5 in | Wt 197.0 lb

## 2024-03-12 DIAGNOSIS — I5022 Chronic systolic (congestive) heart failure: Secondary | ICD-10-CM

## 2024-03-12 DIAGNOSIS — E1169 Type 2 diabetes mellitus with other specified complication: Secondary | ICD-10-CM

## 2024-03-12 DIAGNOSIS — Z7985 Long-term (current) use of injectable non-insulin antidiabetic drugs: Secondary | ICD-10-CM

## 2024-03-12 DIAGNOSIS — E119 Type 2 diabetes mellitus without complications: Secondary | ICD-10-CM | POA: Diagnosis not present

## 2024-03-12 DIAGNOSIS — E669 Obesity, unspecified: Secondary | ICD-10-CM

## 2024-03-12 DIAGNOSIS — Z6829 Body mass index (BMI) 29.0-29.9, adult: Secondary | ICD-10-CM

## 2024-03-12 DIAGNOSIS — I509 Heart failure, unspecified: Secondary | ICD-10-CM

## 2024-03-12 MED ORDER — TIRZEPATIDE 12.5 MG/0.5ML ~~LOC~~ SOAJ
12.5000 mg | SUBCUTANEOUS | 0 refills | Status: DC
  Filled 2024-03-12: qty 6, 84d supply, fill #0

## 2024-03-12 NOTE — Progress Notes (Signed)
 Office: [phone]  /  Fax: [phone]  WEIGHT SUMMARY AND BIOMETRICS  Anthropometric Measurements Height: 5' 8.5 (1.74 m) Weight: 197 lb (89.4 kg) BMI (Calculated): 29.51 Weight at Last Visit: 199 lb Weight Lost Since Last Visit: 2 lb Weight Gained Since Last Visit: 0 Starting Weight: 220 lb Total Weight Loss (lbs): 23 lb (10.4 kg) Peak Weight: 240 lb   Body Composition  Body Fat %: 31 % Fat Mass (lbs): 61.2 lbs Muscle Mass (lbs): 129.6 lbs Total Body Water (lbs): 95 lbs Visceral Fat Rating : 17   Other Clinical Data Fasting: no Labs: no Today's Visit #: 65 Starting Date: 04/30/19    Chief Complaint: OBESITY   History of Present Illness Travis Owens is a 68 year old male with obesity and type two diabetes who presents for obesity treatment and progress assessment.  He is following the category four eating plan about fifty percent of the time, working on increasing his intake of fruits and vegetables, but struggles with consuming the recommended amount of protein and maintaining hydration. He occasionally skips meals. Despite these challenges, he has lost two pounds in the last month.  He is managing type two diabetes with a focus on diet, exercise, and weight loss. He is currently taking Mounjaro  at a dose of 12.5 mg. His most recent hemoglobin [name] was well controlled at 6.7% in July.  He engages in physical activity by doing yard work two days a week, spending at least six hours each day. His activities include clearing brush, cleaning ditches, and managing pine needles, which he finds physically demanding. He reports feeling extremely tired after these activities and mentions his history of congestive heart failure.  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 He acknowledges feeling exhausted after these activities but continues to stay  active.  No issues with his current medication, Mounjaro . His back is doing well despite the physical demands of his activities.      PHYSICAL EXAM:  Blood pressure 106/72, pulse 82, temperature (!) 97.4 F (36.3 C), height 5' 8.5 (1.74 m), weight 197 lb (89.4 kg), SpO2 98%. Body mass index is 29.52 kg/m.  DIAGNOSTIC DATA REVIEWED:  BMET    Component Value Date/Time   NA 143 10/30/2023 0857   K 4.9 10/30/2023 0857   CL 103 10/30/2023 0857   CO2 22 10/30/2023 0857   GLUCOSE 127 (H) 10/30/2023 0857   GLUCOSE 104 (H) 05/09/2023 1036   BUN 16 10/30/2023 0857   CREATININE 0.79 10/30/2023 0857   CALCIUM  9.7 10/30/2023 0857   GFRNONAA 106 04/30/2019 1145   GFRAA 122 04/30/2019 1145   Lab Results  Component Value Date   HGBA1C 6.7 (A) 11/06/2023   HGBA1C 9.4 12/05/2016   Lab Results  Component Value Date   INSULIN  23.9 11/03/2022   INSULIN  24.2 04/30/2019   Lab Results  Component Value Date   TSH 1.77 05/09/2023   CBC    Component Value Date/Time   WBC 8.3 05/09/2023 1036   RBC 4.79 05/09/2023 1036   HGB 14.4 05/09/2023 1036   HGB 13.3 03/14/2023 0954   HCT 44.6 05/09/2023 1036   HCT 40.2 03/14/2023 0954   PLT 268.0 05/09/2023 1036   PLT 268 03/14/2023 0954   MCV 93.2 05/09/2023 1036   MCV 93 03/14/2023 0954   MCH 30.8 03/14/2023 0954   MCHC 32.2 05/09/2023 1036  RDW 14.5 05/09/2023 1036   RDW 13.4 03/14/2023 0954   Iron Studies No results found for: IRON, TIBC, FERRITIN, IRONPCTSAT Lipid Panel     Component Value Date/Time   CHOL 135 05/09/2023 1036   CHOL 106 11/03/2022 0946   TRIG 203.0 (H) 05/09/2023 1036   HDL 54.80 05/09/2023 1036   HDL 57 11/03/2022 0946   CHOLHDL 2 05/09/2023 1036   VLDL 40.6 (H) 05/09/2023 1036   LDLCALC 39 05/09/2023 1036   LDLCALC 29 11/03/2022 0946   Hepatic Function Panel     Component Value Date/Time   PROT 7.0 05/09/2023 1036   PROT 6.8 11/03/2022 0946   ALBUMIN 4.6 05/09/2023 1036   ALBUMIN 4.6  11/03/2022 0946   AST 20 05/09/2023 1036   ALT 20 05/09/2023 1036   ALKPHOS 54 05/09/2023 1036   BILITOT 0.5 05/09/2023 1036   BILITOT 0.4 11/03/2022 0946      Component Value Date/Time   TSH 1.77 05/09/2023 1036   Nutritional Lab Results  Component Value Date   VD25OH 46.3 12/19/2023   VD25OH 35.69 05/09/2023   VD25OH 42.9 11/03/2022     Assessment and Plan Assessment & Plan Obesity Management is ongoing with a focus on dietary changes and physical activity. He is following the category four eating plan about 50% of the time, struggling with protein intake, and working on hydration. Engages in yard work two days a week for at least six hours. Has lost two pounds in the last month, with one pound being fat and one pound water. Muscle mass is stable. No issues reported with Mounjaro . - Continue category four eating plan - Encouraged increased protein intake - Encouraged hydration - Continue yard work for physical activity - Refilled Mounjaro  prescription at [place]  Type 2 diabetes mellitus Well-controlled with a recent hemoglobin [name] of 6.7 in July. He is on Mounjaro  12.5 mg and is working on diet, exercise, and weight loss. - Continue Mounjaro  12.5 mg - Continue dietary and exercise regimen  Congestive heart failure He reports feeling tired, which may be related to heart failure, but is managing well with activity. Overall he feels well and is not retaining fluid. - Continue with his current treatment plan per cardiology -Make sure to not overwork and take breaks when needed.   .  Travis Owens was counseled on the importance of maintaining healthy lifestyle habits, including balanced nutrition, regular physical activity, and behavioral modifications, while taking antiobesity medication.  Patient verbalized understanding that medication is an adjunct to, not a replacement for, lifestyle changes and that the long-term success and weight maintenance depend on continued adherence to  these strategies.   Travis Owens was informed of the importance of frequent follow up visits to maximize his success with intensive lifestyle modifications for his obesity and obesity related health conditions as recommended by USPSTF and CMS guidelines   Travis Penton, MD

## 2024-03-13 ENCOUNTER — Other Ambulatory Visit (HOSPITAL_BASED_OUTPATIENT_CLINIC_OR_DEPARTMENT_OTHER): Payer: Self-pay

## 2024-03-15 ENCOUNTER — Other Ambulatory Visit (HOSPITAL_BASED_OUTPATIENT_CLINIC_OR_DEPARTMENT_OTHER): Payer: Self-pay

## 2024-04-16 ENCOUNTER — Encounter: Payer: Self-pay | Admitting: Cardiology

## 2024-04-19 ENCOUNTER — Other Ambulatory Visit: Payer: Self-pay | Admitting: Cardiology

## 2024-04-19 DIAGNOSIS — E1159 Type 2 diabetes mellitus with other circulatory complications: Secondary | ICD-10-CM

## 2024-04-20 ENCOUNTER — Other Ambulatory Visit (HOSPITAL_BASED_OUTPATIENT_CLINIC_OR_DEPARTMENT_OTHER): Payer: Self-pay

## 2024-04-22 ENCOUNTER — Other Ambulatory Visit (HOSPITAL_BASED_OUTPATIENT_CLINIC_OR_DEPARTMENT_OTHER): Payer: Self-pay

## 2024-04-22 MED ORDER — METOPROLOL SUCCINATE ER 100 MG PO TB24
150.0000 mg | ORAL_TABLET | Freq: Every day | ORAL | 2 refills | Status: AC
  Filled 2024-04-22: qty 135, 90d supply, fill #0

## 2024-04-23 ENCOUNTER — Telehealth (INDEPENDENT_AMBULATORY_CARE_PROVIDER_SITE_OTHER): Payer: Self-pay | Admitting: Family Medicine

## 2024-04-23 ENCOUNTER — Other Ambulatory Visit (HOSPITAL_BASED_OUTPATIENT_CLINIC_OR_DEPARTMENT_OTHER): Payer: Self-pay

## 2024-04-23 ENCOUNTER — Encounter (INDEPENDENT_AMBULATORY_CARE_PROVIDER_SITE_OTHER): Payer: Self-pay | Admitting: Family Medicine

## 2024-04-23 VITALS — Ht 68.5 in | Wt 195.0 lb

## 2024-04-23 DIAGNOSIS — Z7985 Long-term (current) use of injectable non-insulin antidiabetic drugs: Secondary | ICD-10-CM | POA: Diagnosis not present

## 2024-04-23 DIAGNOSIS — J069 Acute upper respiratory infection, unspecified: Secondary | ICD-10-CM

## 2024-04-23 DIAGNOSIS — E1169 Type 2 diabetes mellitus with other specified complication: Secondary | ICD-10-CM

## 2024-04-23 DIAGNOSIS — E669 Obesity, unspecified: Secondary | ICD-10-CM

## 2024-04-23 DIAGNOSIS — Z6829 Body mass index (BMI) 29.0-29.9, adult: Secondary | ICD-10-CM | POA: Diagnosis not present

## 2024-04-23 DIAGNOSIS — Z6833 Body mass index (BMI) 33.0-33.9, adult: Secondary | ICD-10-CM

## 2024-04-23 MED ORDER — TIRZEPATIDE 12.5 MG/0.5ML ~~LOC~~ SOAJ
12.5000 mg | SUBCUTANEOUS | 0 refills | Status: AC
  Filled 2024-04-23: qty 6, 84d supply, fill #0

## 2024-04-23 NOTE — Progress Notes (Signed)
 "  Office: [phone]  /  Fax: [phone]  WEIGHT SUMMARY AND BIOMETRICS  Anthropometric Measurements Height: 5' 8.5 (1.74 m) Weight: 195 lb (88.5 kg) BMI (Calculated): 29.21 Weight at Last Visit: 197lb Weight Lost Since Last Visit: 2lb Starting Weight: 220lb Total Weight Loss (lbs): 25 lb (11.3 kg) Peak Weight: 240lb   No data recorded Other Clinical Data Today's Visit #: 59 Starting Date: 04/30/19 Comments: Virtual Visit    Chief Complaint: OBESITY  Virtual Visit via A/V Note  I connected with Travis Owens on 04/23/2024 at  9:40 AM EST by audiovisual telehealth and verified that I am speaking with the correct person using two identifiers.  Location: Patient: home Provider: clinic   I discussed the limitations, risks, security and privacy concerns of performing an evaluation and management service by AV telehealth and the availability of in person appointments. I also discussed with the patient that there may be a patient responsible charge related to this service. The patient expressed understanding and agreed to proceed.     History of Present Illness  Travis Owens is a 68 year old male with obesity and type 2 diabetes who presents for obesity treatment and progress assessment.  He has been feeling unwell for the last few days with symptoms including a sore throat, chest congestion, coughing, sneezing, and nasal congestion. No fever or gastrointestinal issues are present. He is unsure if he received a flu shot this year but recalls visiting his personal doctor a couple of weeks into January.  His current weight is 195 pounds, indicating weight loss since the last visit. He reports that he has not eaten much recently and that his wife has been working during the holidays, resulting in fewer large meals. He has been following a category four eating plan about 25% of the time due to the holidays. For exercise, he walks two days a week for 30 minutes.  In  addition to obesity, he is managing type 2 diabetes. His most recent hemoglobin [name] is 6.7, down from 8.4 last year. He reports no issues with his current diabetes medication, Mounjaro . No constipation as a side effect of the medication.      PHYSICAL EXAM:  Height 5' 8.5 (1.74 m), weight 195 lb (88.5 kg). Body mass index is 29.22 kg/m.  DIAGNOSTIC DATA REVIEWED:  BMET    Component Value Date/Time   NA 143 10/30/2023 0857   K 4.9 10/30/2023 0857   CL 103 10/30/2023 0857   CO2 22 10/30/2023 0857   GLUCOSE 127 (H) 10/30/2023 0857   GLUCOSE 104 (H) 05/09/2023 1036   BUN 16 10/30/2023 0857   CREATININE 0.79 10/30/2023 0857   CALCIUM  9.7 10/30/2023 0857   GFRNONAA 106 04/30/2019 1145   GFRAA 122 04/30/2019 1145   Lab Results  Component Value Date   HGBA1C 6.7 (A) 11/06/2023   HGBA1C 9.4 12/05/2016   Lab Results  Component Value Date   INSULIN  23.9 11/03/2022   INSULIN  24.2 04/30/2019   Lab Results  Component Value Date   TSH 1.77 05/09/2023   CBC    Component Value Date/Time   WBC 8.3 05/09/2023 1036   RBC 4.79 05/09/2023 1036   HGB 14.4 05/09/2023 1036   HGB 13.3 03/14/2023 0954   HCT 44.6 05/09/2023 1036   HCT 40.2 03/14/2023 0954   PLT 268.0 05/09/2023 1036   PLT 268 03/14/2023 0954   MCV 93.2 05/09/2023 1036   MCV 93 03/14/2023 0954   MCH 30.8 03/14/2023 0954  MCHC 32.2 05/09/2023 1036   RDW 14.5 05/09/2023 1036   RDW 13.4 03/14/2023 0954   Iron Studies No results found for: IRON, TIBC, FERRITIN, IRONPCTSAT Lipid Panel     Component Value Date/Time   CHOL 135 05/09/2023 1036   CHOL 106 11/03/2022 0946   TRIG 203.0 (H) 05/09/2023 1036   HDL 54.80 05/09/2023 1036   HDL 57 11/03/2022 0946   CHOLHDL 2 05/09/2023 1036   VLDL 40.6 (H) 05/09/2023 1036   LDLCALC 39 05/09/2023 1036   LDLCALC 29 11/03/2022 0946   Hepatic Function Panel     Component Value Date/Time   PROT 7.0 05/09/2023 1036   PROT 6.8 11/03/2022 0946   ALBUMIN 4.6  05/09/2023 1036   ALBUMIN 4.6 11/03/2022 0946   AST 20 05/09/2023 1036   ALT 20 05/09/2023 1036   ALKPHOS 54 05/09/2023 1036   BILITOT 0.5 05/09/2023 1036   BILITOT 0.4 11/03/2022 0946      Component Value Date/Time   TSH 1.77 05/09/2023 1036   Nutritional Lab Results  Component Value Date   VD25OH 46.3 12/19/2023   VD25OH 35.69 05/09/2023   VD25OH 42.9 11/03/2022      Assessment & Plan  Obesity Management is ongoing with a focus on dietary changes and exercise. He has been prescribed a category four eating plan but has only adhered to it 25% of the time due to the holidays. He has been walking for exercise two days a week for 30 minutes. Recent weight loss noted, possibly due to illness and reduced meal intake during the holidays. No issues reported with Mounjaro , and no constipation noted. - Refilled Mounjaro  prescription - Encouraged adherence to the category four eating plan - Continue walking for exercise - Plan to maximize exercise benefits in the upcoming year  Type 2 diabetes mellitus with other specified complication Type 2 diabetes is being managed with Mounjaro . Recent hemoglobin [name] has improved to 6.7 from 8.4 last year, indicating better glycemic control. No issues reported with Mounjaro . - Continue Mounjaro  for diabetes management - Will update labs at the next visit to monitor diabetes control  URI Present x 5 days, improving, afebrile, uncertain if he had a flu shot this fall. - Supportive measures only, keep hydrating, avoid outdoor exercise in cold weather until recovered. - Watch for rebound symptoms indicative of bacterial secondary infection and seek treatment if this occurs      Patients who are on anti-obesity medications are counseled on the importance of maintaining healthy lifestyle habits, including balanced nutrition, regular physical activity, and behavioral modifications,  Medication is an adjunct to, not a replacement for, lifestyle changes  and that the long-term success and weight maintenance depend on continued adherence to these strategies.   Travis Owens was informed of the importance of frequent follow up visits to maximize his success with intensive lifestyle modifications for his obesity and obesity related health conditions as recommended by USPSTF and CMS guidelines  [name], MD   "

## 2024-05-10 ENCOUNTER — Other Ambulatory Visit (HOSPITAL_BASED_OUTPATIENT_CLINIC_OR_DEPARTMENT_OTHER): Payer: Self-pay

## 2024-05-10 ENCOUNTER — Encounter: Payer: Self-pay | Admitting: Family Medicine

## 2024-05-10 ENCOUNTER — Ambulatory Visit: Admitting: Family Medicine

## 2024-05-10 VITALS — BP 130/80 | HR 86 | Temp 97.2°F | Ht 68.5 in | Wt 207.4 lb

## 2024-05-10 DIAGNOSIS — E1159 Type 2 diabetes mellitus with other circulatory complications: Secondary | ICD-10-CM | POA: Diagnosis not present

## 2024-05-10 DIAGNOSIS — I152 Hypertension secondary to endocrine disorders: Secondary | ICD-10-CM

## 2024-05-10 DIAGNOSIS — E559 Vitamin D deficiency, unspecified: Secondary | ICD-10-CM

## 2024-05-10 DIAGNOSIS — F325 Major depressive disorder, single episode, in full remission: Secondary | ICD-10-CM | POA: Diagnosis not present

## 2024-05-10 DIAGNOSIS — E538 Deficiency of other specified B group vitamins: Secondary | ICD-10-CM

## 2024-05-10 DIAGNOSIS — G4733 Obstructive sleep apnea (adult) (pediatric): Secondary | ICD-10-CM | POA: Diagnosis not present

## 2024-05-10 DIAGNOSIS — Z23 Encounter for immunization: Secondary | ICD-10-CM

## 2024-05-10 DIAGNOSIS — E785 Hyperlipidemia, unspecified: Secondary | ICD-10-CM

## 2024-05-10 DIAGNOSIS — Z1211 Encounter for screening for malignant neoplasm of colon: Secondary | ICD-10-CM

## 2024-05-10 DIAGNOSIS — Z125 Encounter for screening for malignant neoplasm of prostate: Secondary | ICD-10-CM | POA: Diagnosis not present

## 2024-05-10 DIAGNOSIS — E1169 Type 2 diabetes mellitus with other specified complication: Secondary | ICD-10-CM

## 2024-05-10 DIAGNOSIS — Z7985 Long-term (current) use of injectable non-insulin antidiabetic drugs: Secondary | ICD-10-CM | POA: Diagnosis not present

## 2024-05-10 DIAGNOSIS — E1165 Type 2 diabetes mellitus with hyperglycemia: Secondary | ICD-10-CM

## 2024-05-10 DIAGNOSIS — Z0000 Encounter for general adult medical examination without abnormal findings: Secondary | ICD-10-CM

## 2024-05-10 DIAGNOSIS — Z0001 Encounter for general adult medical examination with abnormal findings: Secondary | ICD-10-CM

## 2024-05-10 LAB — POCT GLYCOSYLATED HEMOGLOBIN (HGB A1C): Hemoglobin A1C: 6.6 % — AB (ref 4.0–5.6)

## 2024-05-10 LAB — PSA: PSA: 2.5 ng/mL (ref 0.10–4.00)

## 2024-05-10 LAB — CBC
HCT: 41.8 % (ref 39.0–52.0)
Hemoglobin: 14 g/dL (ref 13.0–17.0)
MCHC: 33.4 g/dL (ref 30.0–36.0)
MCV: 92.3 fl (ref 78.0–100.0)
Platelets: 273 K/uL (ref 150.0–400.0)
RBC: 4.53 Mil/uL (ref 4.22–5.81)
RDW: 14.7 % (ref 11.5–15.5)
WBC: 7.5 K/uL (ref 4.0–10.5)

## 2024-05-10 LAB — FOLATE: Folate: 19.6 ng/mL

## 2024-05-10 LAB — LIPID PANEL
Cholesterol: 123 mg/dL (ref 28–200)
HDL: 56.4 mg/dL
LDL Cholesterol: 34 mg/dL (ref 10–99)
NonHDL: 66.95
Total CHOL/HDL Ratio: 2
Triglycerides: 165 mg/dL — ABNORMAL HIGH (ref 10.0–149.0)
VLDL: 33 mg/dL (ref 0.0–40.0)

## 2024-05-10 LAB — TESTOSTERONE: Testosterone: 218.22 ng/dL — ABNORMAL LOW (ref 300.00–890.00)

## 2024-05-10 LAB — COMPREHENSIVE METABOLIC PANEL WITH GFR
ALT: 22 U/L (ref 3–53)
AST: 18 U/L (ref 5–37)
Albumin: 4.6 g/dL (ref 3.5–5.2)
Alkaline Phosphatase: 55 U/L (ref 39–117)
BUN: 18 mg/dL (ref 6–23)
CO2: 31 meq/L (ref 19–32)
Calcium: 10.7 mg/dL — ABNORMAL HIGH (ref 8.4–10.5)
Chloride: 101 meq/L (ref 96–112)
Creatinine, Ser: 0.8 mg/dL (ref 0.40–1.50)
GFR: 90.73 mL/min
Glucose, Bld: 129 mg/dL — ABNORMAL HIGH (ref 70–99)
Potassium: 5.3 meq/L — ABNORMAL HIGH (ref 3.5–5.1)
Sodium: 140 meq/L (ref 135–145)
Total Bilirubin: 0.5 mg/dL (ref 0.2–1.2)
Total Protein: 7.2 g/dL (ref 6.0–8.3)

## 2024-05-10 LAB — TSH: TSH: 1.25 u[IU]/mL (ref 0.35–5.50)

## 2024-05-10 LAB — MICROALBUMIN / CREATININE URINE RATIO
Creatinine,U: 96.7 mg/dL
Microalb Creat Ratio: UNDETERMINED mg/g (ref 0.0–30.0)
Microalb, Ur: <0.7 mg/dL

## 2024-05-10 LAB — VITAMIN D 25 HYDROXY (VIT D DEFICIENCY, FRACTURES): VITD: 48.33 ng/mL (ref 30.00–100.00)

## 2024-05-10 LAB — VITAMIN B12: Vitamin B-12: 775 pg/mL (ref 211–911)

## 2024-05-10 MED ORDER — ATORVASTATIN CALCIUM 80 MG PO TABS
80.0000 mg | ORAL_TABLET | Freq: Every day | ORAL | 3 refills | Status: AC
  Filled 2024-05-10: qty 90, 90d supply, fill #0

## 2024-05-10 MED ORDER — EMPAGLIFLOZIN 10 MG PO TABS
10.0000 mg | ORAL_TABLET | Freq: Every day | ORAL | 3 refills | Status: AC
  Filled 2024-05-10: qty 90, 90d supply, fill #0

## 2024-05-10 MED ORDER — ESCITALOPRAM OXALATE 20 MG PO TABS
20.0000 mg | ORAL_TABLET | Freq: Every day | ORAL | 3 refills | Status: AC
  Filled 2024-05-10: qty 90, 90d supply, fill #0

## 2024-05-10 MED ORDER — PANTOPRAZOLE SODIUM 40 MG PO TBEC
40.0000 mg | DELAYED_RELEASE_TABLET | Freq: Every day | ORAL | 3 refills | Status: AC
  Filled 2024-05-10: qty 90, 90d supply, fill #0

## 2024-05-10 NOTE — Assessment & Plan Note (Signed)
Check vitamin D with labs.

## 2024-05-10 NOTE — Assessment & Plan Note (Signed)
 Check B12 with labs.

## 2024-05-10 NOTE — Assessment & Plan Note (Signed)
 He is having some issue with BiPAP compliance due to a poor fitting facemask.  Encouraged compliance with this as this will probably help some with his depressive symptoms and fatigue

## 2024-05-10 NOTE — Assessment & Plan Note (Signed)
 Check lipids with labs.  He is on Repatha  and Lipitor per cardiology.

## 2024-05-10 NOTE — Progress Notes (Signed)
 "  Chief Complaint:  Travis Owens is a 69 y.o. male who presents today for his annual comprehensive physical exam.    Assessment/Plan:   Chronic Problems Addressed Today: Type 2 diabetes mellitus with hyperglycemia, without long-term current use of insulin  ([name]) [name] will control at 6.6.  He is currently on Mounjaro  12.5 mg weekly, metformin  1000 mg twice daily, and Jardiance  10 mg daily.  Congratulated patient on [name] control.  He is planning on incorporating more activity that is working on his diet as well.  Will continue his current regimen and recheck in 6 months.  Hyperlipidemia associated with type 2 diabetes mellitus ([name]) Check lipids with labs.  He is on Repatha  and Lipitor per cardiology.  Vitamin D  deficiency Check vitamin D  with labs.  B12 deficiency Check B12 with labs.  Hypertension associated with diabetes ([name])   Blood pressure at goal today on losartan  12.5 mg daily, spironolactone  12.5 mg daily, and metoprolol  succinate 150 mg daily check labs.  Depression, major, single episode, complete remission Slightly elevated PHQ score today.  She is on Lexapro  20 mg daily.  We did discuss augmenting with Wellbutrin however he like to hold off this for now.  Will check labs including testosterone , B12, folate, TSH, and vitamin D .  OSA treated with BiPAP He is having some issue with BiPAP compliance due to a poor fitting facemask.  Encouraged compliance with this as this will probably help some with his depressive symptoms and fatigue   Preventative Healthcare: Check labs.  Will place referral for colonoscopy.  Flu shot given today.  Will also give Tdap early today as he will be going [place] in a couple of [place] and is due shortly afterwards.   Patient Counseling(The following topics were reviewed and/or handout was given):  -Nutrition: Stressed importance of moderation in sodium/caffeine intake, saturated fat and cholesterol, caloric balance, sufficient intake of fresh  fruits, vegetables, and fiber.  -Stressed the importance of regular exercise.   -Substance Abuse: Discussed cessation/primary prevention of tobacco, alcohol, or other drug use; driving or other dangerous activities under the influence; availability of treatment for abuse.   -Injury prevention: Discussed safety belts, safety helmets, smoke detector, smoking near bedding or upholstery.   -Sexuality: Discussed sexually transmitted diseases, partner selection, use of condoms, avoidance of unintended pregnancy and contraceptive alternatives.   -Dental health: Discussed importance of regular tooth brushing, flossing, and dental visits.  -Health maintenance and immunizations reviewed. Please refer to Health maintenance section.  Return to care in 1 year for next preventative visit.     Subjective:  HPI:  He has no acute complaints today. Patient is here today for his annual physical.  See assessment / plan for status of chronic conditions.  Discussed the use of AI scribe software for clinical note transcription with the patient, who gave verbal consent to proceed.  History of Present Illness Travis Owens is a 69 year old male who presents for an annual physical exam.  He is managing type 2 diabetes with medication, and his hemoglobin [name] has improved to 6.6 from 7.0. He recalls a past [name] of 6.1 and is satisfied with the current improvement.  He is concerned about decreased libido and a reduced interest in intimacy. He is not engaging in regular exercise but mentions working in the yard and attempting to walk. He owns a total gym and is trying to re-establish a routine this year.  He feels tired most of the time and has a history of  sleep apnea. He is not using his BiPAP machine due to mask fit issues but has a new mask that he has not yet tried. He sleeps on his stomach, which he feels helps with his sleep.  He is taking Lexapro  for depression but is unsure of its effectiveness. He has been  on other medications in the past, including Cymbalta, which he discontinued due to side effects. He feels 'kind of down' and attributes some concerns to possibly being related to testosterone  levels.  He had a recent illness with symptoms of a chest cold but feels that his lungs are now clear. He has not had a full-blown flu but experienced some chest congestion.  He discusses travel plans to [place] in April for a Bible distribution trip and inquires about the status of his tetanus vaccination, which is valid through May 2026.        11/06/2023    9:33 AM  Depression screen PHQ 2/9  Decreased Interest 0  Down, Depressed, Hopeless 0  PHQ - 2 Score 0  Altered sleeping 0  Tired, decreased energy 0  Change in appetite 0  Feeling bad or failure about yourself  0  Trouble concentrating 0  Moving slowly or fidgety/restless 0  Suicidal thoughts 0  PHQ-9 Score 0   Difficult doing work/chores Not difficult at all     Data saved with a previous flowsheet row definition    Health Maintenance Due  Topic Date Due   FOOT EXAM  09/12/2020   Colonoscopy  03/13/2022   Influenza Vaccine  11/24/2023   Diabetic kidney evaluation - Urine ACR  05/08/2024     ROS: Per HPI, otherwise a complete review of systems was negative.   PMH:  The following were reviewed and entered/updated in epic: Past Medical History:  Diagnosis Date   Anxiety    Chest pain    Depression    Diabetes mellitus without complication ([name])    Fatty liver    GERD (gastroesophageal reflux disease)    Hyperlipidemia    Hypertension    Sleep apnea    has CPAP/does not use   Patient Active Problem List   Diagnosis Date Noted   B12 deficiency 11/03/2022   Obesity, Beginning BMI 33.45 06/01/2022   Chronic systolic heart failure ([name]) 08/27/2020   Vitamin D  deficiency 06/24/2020   OSA treated with BiPAP 03/18/2020   Hypertension associated with diabetes ([name]) 12/05/2016   Type 2 diabetes mellitus with hyperglycemia,  without long-term current use of insulin  ([name]) 12/05/2016   Hyperlipidemia associated with type 2 diabetes mellitus ([name]) 12/05/2016   Depression, major, single episode, complete remission 12/05/2016   Past Surgical History:  Procedure Laterality Date   COLONOSCOPY     Dr. Obie  2008    Family History  Problem Relation Age of Onset   Cancer Father        Lung Cancer   Stroke Father    Hypertension Paternal Grandmother    Stroke Paternal Grandfather    Colon cancer Neg Hx    Colon polyps Neg Hx    Esophageal cancer Neg Hx    Rectal cancer Neg Hx    Stomach cancer Neg Hx     Medications- reviewed and updated Current Outpatient Medications  Medication Sig Dispense Refill   acetaminophen (TYLENOL) 325 MG tablet Take 1 tablet by mouth as needed.     Blood Glucose Monitoring Suppl (FREESTYLE LITE) DEVI Check Blood sugar twice daily 1 Device 0   Cholecalciferol (VITAMIN  D) 50 MCG (2000 UT) CAPS Take 1 capsule (2,000 Units total) by mouth daily. 30 capsule 0   Cyanocobalamin  (B-12) 1000 MCG CAPS Take 1,000 Int'l Units by mouth daily in the afternoon.     Evolocumab  (REPATHA  SURECLICK) 140 MG/ML SOAJ Inject 1 mL into the skin every 14 (fourteen) days. 6 mL 3   glucose blood (FREESTYLE LITE) test strip Check Blood sugar twice daily 100 each 12   ibuprofen (ADVIL) 200 MG tablet Take 1 tablet by mouth as needed.     Lancets (FREESTYLE) lancets Check blood sugar twice daily 100 each 12   losartan  (COZAAR ) 25 MG tablet Take 0.5 tablets (12.5 mg total) by mouth daily. 45 tablet 3   metFORMIN  (GLUCOPHAGE ) 1000 MG tablet Take 1 tablet (1,000 mg total) by mouth 2 (two) times daily. 180 tablet 3   metoprolol  succinate (TOPROL -XL) 100 MG 24 hr tablet Take 1&1/2 tablets (150 mg total) by mouth daily. 135 tablet 2   Multiple Vitamin (MULTIVITAMIN) tablet Take 1 tablet by mouth daily.     spironolactone  (ALDACTONE ) 25 MG tablet Take 1/2 tablet (12.5 mg total) by mouth daily. 45 tablet 3    tirzepatide  (MOUNJARO ) 12.5 MG/0.5ML Pen Inject 12.5 mg into the skin once a week. 6 mL 0   atorvastatin  (LIPITOR) 80 MG tablet Take 1 tablet (80 mg total) by mouth daily. 90 tablet 3   empagliflozin  (JARDIANCE ) 10 MG TABS tablet Take 1 tablet (10 mg total) by mouth daily before breakfast. 90 tablet 3   escitalopram  (LEXAPRO ) 20 MG tablet Take 1 tablet (20 mg total) by mouth daily. 90 tablet 3   pantoprazole  (PROTONIX ) 40 MG tablet Take 1 tablet (40 mg total) by mouth daily. 90 tablet 3   No current facility-administered medications for this visit.    Allergies-reviewed and updated Allergies[1]  Social History   Socioeconomic History   Marital status: Married    Spouse name: Hesham Womac   Number of children: Not on file   Years of education: Not on file   Highest education level: Not on file  Occupational History   Occupation: Education Officer, Environmental    Comment: [employer]  Tobacco Use   Smoking status: Never   Smokeless tobacco: Never  Vaping Use   Vaping status: Never Used  Substance and Sexual Activity   Alcohol use: No   Drug use: No   Sexual activity: Yes  Other Topics Concern   Not on file  Social History Narrative   Not on file   Social Drivers of Health   Tobacco Use: Low Risk (05/10/2024)   Patient History    Smoking Tobacco Use: Never    Smokeless Tobacco Use: Never    Passive Exposure: Not on file  Financial Resource Strain: Not on file  Food Insecurity: Not on file  Transportation Needs: Not on file  Physical Activity: Not on file  Stress: Not on file  Social Connections: Not on file  Depression (PHQ2-9): Low Risk (11/06/2023)   Depression (PHQ2-9)    PHQ-2 Score: 0  Alcohol Screen: Not on file  Housing: Not on file  Utilities: Not on file  Health Literacy: Not on file        Objective:  Physical Exam: BP 130/80   Pulse 86   Temp (!) 97.2 F (36.2 C) (Temporal)   Ht 5' 8.5 (1.74 m)   Wt 207 lb 6.4 oz (94.1 kg)   SpO2 96%   BMI 31.08 kg/m    Body  mass index is 31.08 kg/m. Wt Readings from Last 3 Encounters:  05/10/24 207 lb 6.4 oz (94.1 kg)  04/23/24 195 lb (88.5 kg)  03/12/24 197 lb (89.4 kg)   Gen: NAD, resting comfortably HEENT: TMs normal bilaterally. OP clear. No thyromegaly noted.  CV: RRR with no murmurs appreciated Pulm: NWOB, CTAB with no crackles, wheezes, or rhonchi GI: Normal bowel sounds present. Soft, Nontender, Nondistended. MSK: no edema, cyanosis, or clubbing noted Skin: warm, dry Neuro: CN2-12 grossly intact. Strength 5/5 in upper and lower extremities. Reflexes symmetric and intact bilaterally.  Psych: Normal affect and thought content     Demondre Aguas M. Kennyth, MD 05/10/2024 9:50 AM     [1] No Known Allergies  "

## 2024-05-10 NOTE — Assessment & Plan Note (Signed)
"    Blood pressure at goal today on losartan  12.5 mg daily, spironolactone  12.5 mg daily, and metoprolol  succinate 150 mg daily check labs. "

## 2024-05-10 NOTE — Assessment & Plan Note (Signed)
 [name] will control at 6.6.  He is currently on Mounjaro  12.5 mg weekly, metformin  1000 mg twice daily, and Jardiance  10 mg daily.  Congratulated patient on [name] control.  He is planning on incorporating more activity that is working on his diet as well.  Will continue his current regimen and recheck in 6 months.

## 2024-05-10 NOTE — Assessment & Plan Note (Signed)
 Slightly elevated PHQ score today.  She is on Lexapro  20 mg daily.  We did discuss augmenting with Wellbutrin however he like to hold off this for now.  Will check labs including testosterone , B12, folate, TSH, and vitamin D .

## 2024-05-14 ENCOUNTER — Ambulatory Visit: Payer: Self-pay | Admitting: Family Medicine

## 2024-05-14 DIAGNOSIS — R7989 Other specified abnormal findings of blood chemistry: Secondary | ICD-10-CM

## 2024-05-14 DIAGNOSIS — E875 Hyperkalemia: Secondary | ICD-10-CM

## 2024-05-14 DIAGNOSIS — E291 Testicular hypofunction: Secondary | ICD-10-CM

## 2024-05-14 NOTE — Progress Notes (Signed)
 Calcium  and potassium are mildly elevated.  This is probably nothing to worry about but I would like for him to come back and recheck in a few weeks if possible.  Please place future order for CMET.  His cholesterol panel is at goal.  His urine specimen looks great-no signs of kidney issues.  His B12, folate, and vitamin D  levels are all at goal.  Thyroid  and PSA are at goal as well.  His testosterone  is low.  This could explain some of his fatigue and other symptoms.  Recommend he come back in a couple of weeks to recheck early morning testosterone  if he is interested in replacement therapy.

## 2024-05-21 ENCOUNTER — Encounter (INDEPENDENT_AMBULATORY_CARE_PROVIDER_SITE_OTHER): Payer: Self-pay | Admitting: Family Medicine

## 2024-05-21 ENCOUNTER — Telehealth (INDEPENDENT_AMBULATORY_CARE_PROVIDER_SITE_OTHER): Admitting: Family Medicine

## 2024-05-21 VITALS — Ht 68.5 in | Wt 197.0 lb

## 2024-05-21 DIAGNOSIS — E669 Obesity, unspecified: Secondary | ICD-10-CM

## 2024-05-21 DIAGNOSIS — Z7985 Long-term (current) use of injectable non-insulin antidiabetic drugs: Secondary | ICD-10-CM

## 2024-05-21 DIAGNOSIS — E1169 Type 2 diabetes mellitus with other specified complication: Secondary | ICD-10-CM

## 2024-05-21 DIAGNOSIS — Z7984 Long term (current) use of oral hypoglycemic drugs: Secondary | ICD-10-CM | POA: Diagnosis not present

## 2024-05-21 DIAGNOSIS — Z6829 Body mass index (BMI) 29.0-29.9, adult: Secondary | ICD-10-CM | POA: Diagnosis not present

## 2024-05-21 NOTE — Progress Notes (Signed)
 "  Office: [phone]  /  Fax: [phone]  WEIGHT SUMMARY AND BIOMETRICS  Anthropometric Measurements Height: 5' 8.5 (1.74 m) Weight: 197 lb (89.4 kg) (last ov) BMI (Calculated): 29.51 Weight at Last Visit: 195 lb (last ov) Starting Weight: 220 lb Total Weight Loss (lbs): 23 lb (10.4 kg) Peak Weight: 240 lb   No data recorded Other Clinical Data [name]: 6.6 mg/dL (98/83/7973) Fasting: no Labs: no Today's Visit #: 42 Starting Date: 04/30/19 Comments: my chart visit    Chief Complaint: OBESITY  Virtual Visit via A/V Note  I connected with Travis Owens on 05/21/24 at 11:20 AM EST by audiovisual telehealth and verified that I am speaking with the correct person using two identifiers.  Location: Patient: home Provider: office   I discussed the limitations, risks, security and privacy concerns of performing an evaluation and management service by AV telehealth and the availability of in person appointments. I also discussed with the patient that there may be a patient responsible charge related to this service. The patient expressed understanding and agreed to proceed. History of Present Illness  Travis Owens is a 69 year old male with obesity and type 2 diabetes who presents for a follow-up on his obesity treatment plan.  He currently weighs 196 pounds, reflecting a weight loss of two pounds since his last visit. He adheres to his Category 4 eating plan approximately 25% of the time, particularly over the holidays, and is not engaging in any exercise. His diet includes more fruits and vegetables, and he is attempting to meet his recommended protein intake, though he acknowledges inadequate hydration. He does not skip meals and generally sleeps seven to nine hours per night.  In managing type 2 diabetes, he focuses on diet and exercise to improve glucose levels. His last hemoglobin [name] was well controlled at 6.6%. The patient did not report any problems with low blood  sugar, nausea, or other related symptoms.      PHYSICAL EXAM:  Height 5' 8.5 (1.74 m), weight 197 lb (89.4 kg). Body mass index is 29.52 kg/m.  DIAGNOSTIC DATA REVIEWED BY MYSELF TODAY:  BMET    Component Value Date/Time   NA 140 05/10/2024 1003   NA 143 10/30/2023 0857   K 5.3 (H) 05/10/2024 1003   CL 101 05/10/2024 1003   CO2 31 05/10/2024 1003   GLUCOSE 129 (H) 05/10/2024 1003   BUN 18 05/10/2024 1003   BUN 16 10/30/2023 0857   CREATININE 0.80 05/10/2024 1003   CALCIUM  10.7 (H) 05/10/2024 1003   GFRNONAA 106 04/30/2019 1145   GFRAA 122 04/30/2019 1145   Lab Results  Component Value Date   HGBA1C 6.6 (A) 05/10/2024   HGBA1C 9.4 12/05/2016   Lab Results  Component Value Date   INSULIN  23.9 11/03/2022   INSULIN  24.2 04/30/2019   Lab Results  Component Value Date   TSH 1.25 05/10/2024   CBC    Component Value Date/Time   WBC 7.5 05/10/2024 1003   RBC 4.53 05/10/2024 1003   HGB 14.0 05/10/2024 1003   HGB 13.3 03/14/2023 0954   HCT 41.8 05/10/2024 1003   HCT 40.2 03/14/2023 0954   PLT 273.0 05/10/2024 1003   PLT 268 03/14/2023 0954   MCV 92.3 05/10/2024 1003   MCV 93 03/14/2023 0954   MCH 30.8 03/14/2023 0954   MCHC 33.4 05/10/2024 1003   RDW 14.7 05/10/2024 1003   RDW 13.4 03/14/2023 0954   Iron Studies No results found for: IRON, TIBC,  FERRITIN, IRONPCTSAT Lipid Panel     Component Value Date/Time   CHOL 123 05/10/2024 1003   CHOL 106 11/03/2022 0946   TRIG 165.0 (H) 05/10/2024 1003   HDL 56.40 05/10/2024 1003   HDL 57 11/03/2022 0946   CHOLHDL 2 05/10/2024 1003   VLDL 33.0 05/10/2024 1003   LDLCALC 34 05/10/2024 1003   LDLCALC 29 11/03/2022 0946   Hepatic Function Panel     Component Value Date/Time   PROT 7.2 05/10/2024 1003   PROT 6.8 11/03/2022 0946   ALBUMIN 4.6 05/10/2024 1003   ALBUMIN 4.6 11/03/2022 0946   AST 18 05/10/2024 1003   ALT 22 05/10/2024 1003   ALKPHOS 55 05/10/2024 1003   BILITOT 0.5 05/10/2024 1003    BILITOT 0.4 11/03/2022 0946      Component Value Date/Time   TSH 1.25 05/10/2024 1003   Nutritional Lab Results  Component Value Date   VD25OH 48.33 05/10/2024   VD25OH 46.3 12/19/2023   VD25OH 35.69 05/09/2023     Assessment and Plan Assessment & Plan  Obesity Management is ongoing with a recent weight loss of two pounds since the last visit. He has been following his eating plan about 25% of the time over the holidays and is not currently exercising. He is eating more fruits and vegetables and trying to meet his protein intake, but is not hydrating adequately. He generally gets seven to nine hours of sleep per night. - Encouraged increased physical activity, aiming for 150 minutes of moderate intensity exercise per week. - Recommended using walking videos by Travis Owens for indoor exercise options. - Advised breaking exercise into smaller sessions throughout the week for better adherence. - Encouraged continued dietary modifications, focusing on protein and vegetables, and reducing carbohydrate intake if necessary.  Type 2 diabetes mellitus Well-controlled with a recent [name] of 6.6. He has been working on diet and exercise to improve glucose levels. No reported issues with blood sugars, nausea, or hypoglycemia. - Continue current dietary and exercise regimen to maintain glucose control. - Continue Mounjaro  12.5 mg weekly and we will continue to manage this medication. - Continue Jardiance  and metformin  as well      Patients who are on anti-obesity medications are counseled on the importance of maintaining healthy lifestyle habits, including balanced nutrition, regular physical activity, and behavioral modifications,  Medication is an adjunct to, not a replacement for, lifestyle changes and that the long-term success and weight maintenance depend on continued adherence to these strategies.   Travis Owens was informed of the importance of frequent follow up visits to maximize his  success with intensive lifestyle modifications for his obesity and obesity related health conditions as recommended by USPSTF and CMS guidelines  [name], MD   "

## 2024-05-27 ENCOUNTER — Telehealth: Payer: Self-pay

## 2024-05-28 ENCOUNTER — Other Ambulatory Visit

## 2024-05-29 ENCOUNTER — Ambulatory Visit: Admitting: Cardiology

## 2024-05-31 ENCOUNTER — Other Ambulatory Visit

## 2024-05-31 ENCOUNTER — Telehealth: Payer: Self-pay | Admitting: *Deleted

## 2024-05-31 NOTE — Telephone Encounter (Signed)
 Dr Legrand and John,This pt ans colonoscopy on 06/27/24 ([facility])l. Last Echo 03/19/23 EF of 35-40%. Next OV with Cardiology is 07/01/24.  Please review and advise Thank you

## 2024-06-13 ENCOUNTER — Encounter

## 2024-06-19 ENCOUNTER — Ambulatory Visit (INDEPENDENT_AMBULATORY_CARE_PROVIDER_SITE_OTHER): Admitting: Family Medicine

## 2024-06-27 ENCOUNTER — Encounter: Admitting: Gastroenterology

## 2024-07-01 ENCOUNTER — Ambulatory Visit (HOSPITAL_BASED_OUTPATIENT_CLINIC_OR_DEPARTMENT_OTHER): Admitting: Cardiology

## 2024-11-08 ENCOUNTER — Ambulatory Visit: Admitting: Family Medicine

## 2025-05-16 ENCOUNTER — Encounter: Admitting: Family Medicine
